# Patient Record
Sex: Female | Born: 1950
Health system: Southern US, Community
[De-identification: ages and names within clinical notes are randomized; demographics above are authoritative.]

## PROBLEM LIST (undated history)

## (undated) DIAGNOSIS — K219 Gastro-esophageal reflux disease without esophagitis: Secondary | ICD-10-CM

## (undated) DIAGNOSIS — I10 Essential (primary) hypertension: Secondary | ICD-10-CM

## (undated) DIAGNOSIS — L309 Dermatitis, unspecified: Secondary | ICD-10-CM

## (undated) DIAGNOSIS — R42 Dizziness and giddiness: Secondary | ICD-10-CM

## (undated) DIAGNOSIS — IMO0002 Reserved for concepts with insufficient information to code with codable children: Secondary | ICD-10-CM

## (undated) DIAGNOSIS — K432 Incisional hernia without obstruction or gangrene: Secondary | ICD-10-CM

## (undated) DIAGNOSIS — A64 Unspecified sexually transmitted disease: Secondary | ICD-10-CM

## (undated) DIAGNOSIS — R87610 Atypical squamous cells of undetermined significance on cytologic smear of cervix (ASC-US): Secondary | ICD-10-CM

## (undated) DIAGNOSIS — M1990 Unspecified osteoarthritis, unspecified site: Secondary | ICD-10-CM

## (undated) DIAGNOSIS — R200 Anesthesia of skin: Secondary | ICD-10-CM

## (undated) DIAGNOSIS — R202 Paresthesia of skin: Secondary | ICD-10-CM

## (undated) DIAGNOSIS — C649 Malignant neoplasm of unspecified kidney, except renal pelvis: Secondary | ICD-10-CM

## (undated) DIAGNOSIS — Z860100 Personal history of colon polyps, unspecified: Secondary | ICD-10-CM

## (undated) DIAGNOSIS — E785 Hyperlipidemia, unspecified: Secondary | ICD-10-CM

## (undated) HISTORY — DX: Dermatitis, unspecified: L30.9

## (undated) HISTORY — DX: Personal history of colon polyps, unspecified: Z86.0100

## (undated) HISTORY — PX: TUBAL LIGATION: SHX77

## (undated) HISTORY — PX: MYOMECTOMY ABDOMINAL APPROACH: SUR870

## (undated) HISTORY — DX: Unspecified sexually transmitted disease: A64

## (undated) HISTORY — DX: Atypical squamous cells of undetermined significance on cytologic smear of cervix (ASC-US): R87.610

## (undated) HISTORY — DX: Reserved for concepts with insufficient information to code with codable children: IMO0002

## (undated) HISTORY — DX: Essential (primary) hypertension: I10

## (undated) HISTORY — DX: Gastro-esophageal reflux disease without esophagitis: K21.9

## (undated) HISTORY — PX: CATARACT EXTRACTION, BILATERAL: SHX1313

## (undated) HISTORY — PX: OTHER SURGICAL HISTORY: SHX169

## (undated) HISTORY — DX: Personal history of colonic polyps: Z86.010

## (undated) HISTORY — DX: Malignant neoplasm of unspecified kidney, except renal pelvis: C64.9

---

## 2004-10-22 LAB — CONVERTED CEMR LAB: Pap Smear: NORMAL

## 2008-10-11 ENCOUNTER — Ambulatory Visit: Payer: Self-pay | Admitting: Internal Medicine

## 2008-10-11 DIAGNOSIS — L259 Unspecified contact dermatitis, unspecified cause: Secondary | ICD-10-CM | POA: Insufficient documentation

## 2008-10-11 DIAGNOSIS — I10 Essential (primary) hypertension: Secondary | ICD-10-CM | POA: Insufficient documentation

## 2008-10-12 DIAGNOSIS — K219 Gastro-esophageal reflux disease without esophagitis: Secondary | ICD-10-CM | POA: Insufficient documentation

## 2008-11-11 ENCOUNTER — Telehealth: Payer: Self-pay | Admitting: Internal Medicine

## 2008-11-12 ENCOUNTER — Ambulatory Visit: Payer: Self-pay | Admitting: Internal Medicine

## 2008-11-12 DIAGNOSIS — N912 Amenorrhea, unspecified: Secondary | ICD-10-CM | POA: Insufficient documentation

## 2008-11-12 LAB — CONVERTED CEMR LAB
ALT: 19 units/L (ref 0–35)
AST: 16 units/L (ref 0–37)
Albumin: 3.6 g/dL (ref 3.5–5.2)
Alkaline Phosphatase: 78 units/L (ref 39–117)
BUN: 21 mg/dL (ref 6–23)
Basophils Absolute: 0 10*3/uL (ref 0.0–0.1)
Basophils Relative: 0.2 % (ref 0.0–3.0)
Bilirubin Urine: NEGATIVE
Bilirubin, Direct: 0.1 mg/dL (ref 0.0–0.3)
CO2: 33 meq/L — ABNORMAL HIGH (ref 19–32)
Calcium: 10 mg/dL (ref 8.4–10.5)
Chloride: 102 meq/L (ref 96–112)
Creatinine, Ser: 0.8 mg/dL (ref 0.4–1.2)
Crystals: NEGATIVE
Eosinophils Absolute: 0.2 10*3/uL (ref 0.0–0.7)
Eosinophils Relative: 2.3 % (ref 0.0–5.0)
FSH: 61.3 milliintl units/mL
GFR calc Af Amer: 95 mL/min
GFR calc non Af Amer: 79 mL/min
Glucose, Bld: 117 mg/dL — ABNORMAL HIGH (ref 70–99)
HCT: 34.8 % — ABNORMAL LOW (ref 36.0–46.0)
Hemoglobin, Urine: NEGATIVE
Hemoglobin: 12.1 g/dL (ref 12.0–15.0)
Ketones, ur: NEGATIVE mg/dL
LH: 27.4 milliintl units/mL
Leukocytes, UA: NEGATIVE
Lymphocytes Relative: 30.7 % (ref 12.0–46.0)
MCHC: 34.7 g/dL (ref 30.0–36.0)
MCV: 88.6 fL (ref 78.0–100.0)
Magnesium: 2 mg/dL (ref 1.5–2.5)
Monocytes Absolute: 0.4 10*3/uL (ref 0.1–1.0)
Monocytes Relative: 5.9 % (ref 3.0–12.0)
Mucus, UA: NEGATIVE
Neutro Abs: 4 10*3/uL (ref 1.4–7.7)
Neutrophils Relative %: 60.9 % (ref 43.0–77.0)
Nitrite: NEGATIVE
Platelets: 167 10*3/uL (ref 150–400)
Potassium: 3.6 meq/L (ref 3.5–5.1)
RBC / HPF: NONE SEEN
RBC: 3.92 M/uL (ref 3.87–5.11)
RDW: 13.3 % (ref 11.5–14.6)
Sodium: 141 meq/L (ref 135–145)
Specific Gravity, Urine: >=1.03 (ref 1.000–1.03)
TSH: 0.41 microintl units/mL (ref 0.35–5.50)
Total Bilirubin: 0.6 mg/dL (ref 0.3–1.2)
Total Protein, Urine: NEGATIVE mg/dL
Total Protein: 6.9 g/dL (ref 6.0–8.3)
Urine Glucose: NEGATIVE mg/dL
Urobilinogen, UA: 0.2 (ref 0.0–1.0)
WBC, UA: NONE SEEN cells/hpf
WBC: 6.7 10*3/uL (ref 4.5–10.5)
pH: 5.5 (ref 5.0–8.0)

## 2008-11-14 ENCOUNTER — Encounter: Payer: Self-pay | Admitting: Internal Medicine

## 2008-12-27 ENCOUNTER — Ambulatory Visit: Payer: Self-pay | Admitting: Internal Medicine

## 2008-12-27 DIAGNOSIS — M25519 Pain in unspecified shoulder: Secondary | ICD-10-CM | POA: Insufficient documentation

## 2008-12-27 DIAGNOSIS — R7309 Other abnormal glucose: Secondary | ICD-10-CM | POA: Insufficient documentation

## 2008-12-27 DIAGNOSIS — D649 Anemia, unspecified: Secondary | ICD-10-CM | POA: Insufficient documentation

## 2008-12-27 DIAGNOSIS — M5412 Radiculopathy, cervical region: Secondary | ICD-10-CM | POA: Insufficient documentation

## 2008-12-27 LAB — CONVERTED CEMR LAB
Basophils Absolute: 0 10*3/uL (ref 0.0–0.1)
Basophils Relative: 0.3 % (ref 0.0–3.0)
Eosinophils Absolute: 0.1 10*3/uL (ref 0.0–0.7)
Eosinophils Relative: 2.3 % (ref 0.0–5.0)
Folate: >20 ng/mL
HCT: 38.3 % (ref 36.0–46.0)
Hemoglobin: 13.2 g/dL (ref 12.0–15.0)
Hgb A1c MFr Bld: 5.6 % (ref 4.6–6.0)
Iron: 74 ug/dL (ref 42–145)
Lymphocytes Relative: 26.5 % (ref 12.0–46.0)
MCHC: 34.5 g/dL (ref 30.0–36.0)
MCV: 89.3 fL (ref 78.0–100.0)
Monocytes Absolute: 0.6 10*3/uL (ref 0.1–1.0)
Monocytes Relative: 11.6 % (ref 3.0–12.0)
Neutro Abs: 3.1 10*3/uL (ref 1.4–7.7)
Neutrophils Relative %: 59.3 % (ref 43.0–77.0)
Platelets: 150 10*3/uL (ref 150–400)
RBC: 4.29 M/uL (ref 3.87–5.11)
RDW: 13.6 % (ref 11.5–14.6)
Saturation Ratios: 21.1 % (ref 20.0–50.0)
Transferrin: 250.5 mg/dL (ref 212.0–360.0)
Vitamin B-12: 469 pg/mL (ref 211–911)
WBC: 5.2 10*3/uL (ref 4.5–10.5)

## 2009-01-31 ENCOUNTER — Ambulatory Visit: Payer: Self-pay | Admitting: Internal Medicine

## 2009-10-11 ENCOUNTER — Telehealth: Payer: Self-pay | Admitting: Internal Medicine

## 2009-10-17 ENCOUNTER — Ambulatory Visit: Payer: Self-pay | Admitting: Internal Medicine

## 2009-11-16 ENCOUNTER — Ambulatory Visit: Payer: Self-pay | Admitting: Internal Medicine

## 2010-03-17 ENCOUNTER — Telehealth: Payer: Self-pay | Admitting: Internal Medicine

## 2010-05-17 ENCOUNTER — Ambulatory Visit: Payer: Self-pay | Admitting: Internal Medicine

## 2010-11-01 ENCOUNTER — Encounter: Payer: Self-pay | Admitting: Internal Medicine

## 2010-11-01 ENCOUNTER — Other Ambulatory Visit: Payer: Self-pay | Admitting: Internal Medicine

## 2010-11-01 ENCOUNTER — Ambulatory Visit
Admission: RE | Admit: 2010-11-01 | Discharge: 2010-11-01 | Payer: Self-pay | Source: Home / Self Care | Attending: Internal Medicine | Admitting: Internal Medicine

## 2010-11-01 ENCOUNTER — Other Ambulatory Visit
Admission: RE | Admit: 2010-11-01 | Discharge: 2010-11-01 | Payer: Self-pay | Source: Home / Self Care | Admitting: Internal Medicine

## 2010-11-01 DIAGNOSIS — Z860100 Personal history of colon polyps, unspecified: Secondary | ICD-10-CM | POA: Insufficient documentation

## 2010-11-01 DIAGNOSIS — E559 Vitamin D deficiency, unspecified: Secondary | ICD-10-CM | POA: Insufficient documentation

## 2010-11-01 LAB — CBC WITH DIFFERENTIAL/PLATELET
Basophils Absolute: 0 10*3/uL (ref 0.0–0.1)
Basophils Relative: 0.6 % (ref 0.0–3.0)
Eosinophils Absolute: 0.2 10*3/uL (ref 0.0–0.7)
Eosinophils Relative: 3 % (ref 0.0–5.0)
HCT: 37.1 % (ref 36.0–46.0)
Hemoglobin: 12.9 g/dL (ref 12.0–15.0)
Lymphocytes Relative: 31.4 % (ref 12.0–46.0)
Lymphs Abs: 1.6 10*3/uL (ref 0.7–4.0)
MCHC: 34.8 g/dL (ref 30.0–36.0)
MCV: 88.6 fl (ref 78.0–100.0)
Monocytes Absolute: 0.6 10*3/uL (ref 0.1–1.0)
Monocytes Relative: 11.9 % (ref 3.0–12.0)
Neutro Abs: 2.7 10*3/uL (ref 1.4–7.7)
Neutrophils Relative %: 53.1 % (ref 43.0–77.0)
Platelets: 153 10*3/uL (ref 150.0–400.0)
RBC: 4.18 Mil/uL (ref 3.87–5.11)
RDW: 14.5 % (ref 11.5–14.6)
WBC: 5 10*3/uL (ref 4.5–10.5)

## 2010-11-01 LAB — HEPATIC FUNCTION PANEL
ALT: 16 U/L (ref 0–35)
AST: 14 U/L (ref 0–37)
Albumin: 3.6 g/dL (ref 3.5–5.2)
Alkaline Phosphatase: 85 U/L (ref 39–117)
Bilirubin, Direct: 0 mg/dL (ref 0.0–0.3)
Total Bilirubin: 0.5 mg/dL (ref 0.3–1.2)
Total Protein: 6.8 g/dL (ref 6.0–8.3)

## 2010-11-01 LAB — BASIC METABOLIC PANEL
BUN: 20 mg/dL (ref 6–23)
CO2: 27 mEq/L (ref 19–32)
Calcium: 9.8 mg/dL (ref 8.4–10.5)
Chloride: 112 mEq/L (ref 96–112)
Creatinine, Ser: 0.8 mg/dL (ref 0.4–1.2)
GFR: 89.47 mL/min (ref >60.00)
Glucose, Bld: 95 mg/dL (ref 70–99)
Potassium: 4.2 mEq/L (ref 3.5–5.1)
Sodium: 144 mEq/L (ref 135–145)

## 2010-11-01 LAB — LIPID PANEL
Cholesterol: 192 mg/dL (ref 0–200)
HDL: 47.4 mg/dL (ref >39.00)
LDL Cholesterol: 132 mg/dL — ABNORMAL HIGH (ref 0–99)
Total CHOL/HDL Ratio: 4
Triglycerides: 62 mg/dL (ref 0.0–149.0)
VLDL: 12.4 mg/dL (ref 0.0–40.0)

## 2010-11-01 LAB — URINALYSIS, ROUTINE W REFLEX MICROSCOPIC
Bilirubin Urine: NEGATIVE
Hemoglobin, Urine: NEGATIVE
Ketones, ur: NEGATIVE
Leukocytes, UA: NEGATIVE
Nitrite: NEGATIVE
Specific Gravity, Urine: 1.025 (ref 1.000–1.030)
Total Protein, Urine: NEGATIVE
Urine Glucose: NEGATIVE
Urobilinogen, UA: 0.2 (ref 0.0–1.0)
pH: 5.5 (ref 5.0–8.0)

## 2010-11-01 LAB — HEMOGLOBIN A1C: Hgb A1c MFr Bld: 5.9 % (ref 4.6–6.5)

## 2010-11-01 LAB — TSH: TSH: 0.51 u[IU]/mL (ref 0.35–5.50)

## 2010-11-01 LAB — HM PAP SMEAR

## 2010-11-01 LAB — CONVERTED CEMR LAB: Vit D, 25-Hydroxy: 34 ng/mL (ref 30–89)

## 2010-11-02 ENCOUNTER — Encounter (INDEPENDENT_AMBULATORY_CARE_PROVIDER_SITE_OTHER): Payer: Self-pay | Admitting: *Deleted

## 2010-11-08 ENCOUNTER — Encounter
Admission: RE | Admit: 2010-11-08 | Discharge: 2010-11-08 | Payer: Self-pay | Source: Home / Self Care | Attending: Internal Medicine | Admitting: Internal Medicine

## 2010-11-08 LAB — HM MAMMOGRAPHY: HM Mammogram: NEGATIVE

## 2010-11-13 ENCOUNTER — Encounter (INDEPENDENT_AMBULATORY_CARE_PROVIDER_SITE_OTHER): Payer: Self-pay | Admitting: *Deleted

## 2010-11-13 ENCOUNTER — Encounter: Payer: Self-pay | Admitting: *Deleted

## 2010-11-15 ENCOUNTER — Ambulatory Visit
Admission: RE | Admit: 2010-11-15 | Discharge: 2010-11-15 | Payer: Self-pay | Source: Home / Self Care | Attending: Gastroenterology | Admitting: Gastroenterology

## 2010-11-21 NOTE — Progress Notes (Signed)
Summary: Rx refill req  Phone Note Refill Request   Refills Requested: Medication #1:  LISINOPRIL-HYDROCHLOROTHIAZIDE 10-12.5 MG TABS .Take 1 tablet by mouth once a day   Dosage confirmed as above?Dosage Confirmed Pt called requesting a 90 day supply of medication because she will not have Insurance after 06/01  Initial call taken by: Margaret Pyle, CMA,  Mar 17, 2010 11:17 AM    Prescriptions: LISINOPRIL-HYDROCHLOROTHIAZIDE 10-12.5 MG TABS (LISINOPRIL-HYDROCHLOROTHIAZIDE) .Take 1 tablet by mouth once a day  #90 x 1   Entered by:   Margaret Pyle, CMA   Authorized by:   Etta Grandchild MD   Signed by:   Margaret Pyle, CMA on 03/17/2010   Method used:   Electronically to        [facility] (retail)       [address]       [place], [place]  [address]       Ph: [phone]       Fax: [phone]   RxID:   [id number]
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
## 2010-11-22 ENCOUNTER — Encounter: Payer: Self-pay | Admitting: Internal Medicine

## 2010-11-23 NOTE — Assessment & Plan Note (Signed)
Summary: CPX/ TO COME FASTING/ NWS  #   Vital Signs:  Patient profile:   60 year old female Menstrual status:  postmenopausal Height:      62 inches Weight:      213 pounds BMI:     39.10 O2 Sat:      96 % on Room air Temp:     97.8 degrees F oral Pulse rate:   73 / minute Pulse rhythm:   regular Resp:     16 per minute BP supine:   156 / 88  (right arm) BP sitting:   160 / 86  (left arm) Cuff size:   large  Vitals Entered By: Rock Nephew CMA (November 01, 2010 9:16 AM)  Nutrition Counseling: Patient's BMI is greater than 25 and therefore counseled on weight management options.  O2 Flow:  Room air CC: Patient here for CPX w/labs, Preventive Care Is Patient Diabetic? No Pain Assessment Patient in pain? no       Does patient need assistance? Functional Status Self care Ambulation Normal LMP - Character: light     Menstrual Status postmenopausal Last PAP Result normal   Primary Care Provider:  Etta Grandchild MD  CC:  Patient here for CPX w/labs and Preventive Care.  History of Present Illness:  Hypertension Follow-Up      This is a 60 year old woman who presents for Hypertension follow-up.  The patient denies lightheadedness, urinary frequency, headaches, edema, rash, and fatigue.  The patient denies the following associated symptoms: chest pain, chest pressure, exercise intolerance, dyspnea, palpitations, syncope, leg edema, and pedal edema.  Compliance with medications (by patient report) has been poor.  The patient reports that dietary compliance has been fair.  The patient reports no exercise.  Adjunctive measures currently used by the patient include salt restriction and relaxation.  She ran out of BP med 5 days ago.  Dyspepsia History:      She has no alarm features of dyspepsia including no history of melena, hematochezia, dysphagia, persistent vomiting, or involuntary weight loss > 5%.  There is a prior history of GERD.  The patient does not have a prior  history of documented ulcer disease.  The dominant symptom is heartburn or acid reflux.  An H-2 blocker medication is currently being taken.  She notes that the symptoms have improved with the H-2 blocker therapy.  Symptoms have not persisted after 4 weeks of H-2 blocker treatment.     Preventive Screening-Counseling & Management  Alcohol-Tobacco     Alcohol drinks/day: 0     Alcohol Counseling: not indicated; patient does not drink     Smoking Status: never     Passive Smoke Exposure: no     Tobacco Counseling: not indicated; no tobacco use  Hep-HIV-STD-Contraception     Hepatitis Risk: no risk noted     HIV Risk: no     STD Risk: no risk noted     Dental Visit-last 6 months yes     Dental Care Counseling: to seek dental care; no dental care within six months     SBE monthly: yes     SBE Education/Counseling: to perform regular SBE     Sun Exposure-Excessive: no  Safety-Violence-Falls     Seat Belt Use: yes     Helmet Use: n/a     Firearms in the Home: no firearms in the home     Smoke Detectors: yes     Violence in the Home: no risk noted  Sexual Abuse: no  Current Medications (verified): 1)  Lisinopril-Hydrochlorothiazide 10-12.5 Mg Tabs (Lisinopril-Hydrochlorothiazide) .... .take 1 Tablet By Mouth Once A Day 2)  Topicort Lp 0.05 % Crea (Desoximetasone) .... Apply To Eczema Bid 3)  Vit D 2000 Iu 4)  Centrum Ultra Womens  Tabs (Multiple Vitamins-Minerals) .... Take 1 Tablet By Mouth Once A Day 5)  Vitamin E .... Take 1 Tablet By Mouth Once A Day  Allergies (verified): 1)  ! Codeine Sulfate (Codeine Sulfate)  Past History:  Past Surgical History: Last updated: 10/11/2008 Caesarean section  Family History: Last updated: 10/11/2008 Family History Hypertension Family History Uterine cancer  Social History: Last updated: 10/11/2008 Single Never Smoked Alcohol use-no Drug use-no Regular exercise-yes  Risk Factors: Alcohol Use: 0 (11/01/2010) Exercise: yes  (10/11/2008)  Risk Factors: Smoking Status: never (11/01/2010) Passive Smoke Exposure: no (11/01/2010)  Past Medical History: Hypertension Eczema GERD Colonic polyps, hx of  Social History: Hepatitis Risk:  no risk noted STD Risk:  no risk noted Dental Care w/in 6 mos.:  yes Sun Exposure-Excessive:  no Seat Belt Use:  yes  Review of Systems  The patient denies anorexia, fever, weight loss, weight gain, chest pain, syncope, dyspnea on exertion, peripheral edema, prolonged cough, headaches, hemoptysis, abdominal pain, melena, hematochezia, severe indigestion/heartburn, hematuria, suspicious skin lesions, transient blindness, difficulty walking, depression, unusual weight change, abnormal bleeding, enlarged lymph nodes, angioedema, and breast masses.   Derm:  Denies changes in color of skin, changes in nail beds, dryness, itching, and rash. Endo:  Denies cold intolerance, excessive hunger, excessive thirst, excessive urination, heat intolerance, polyuria, and weight change. Heme:  Denies abnormal bruising, bleeding, enlarge lymph nodes, fevers, pallor, and skin discoloration.  Physical Exam  General:  alert, well-developed, well-nourished, well-hydrated, and overweight-appearing.   Head:  normocephalic, atraumatic, no abnormalities observed, and no abnormalities palpated.   Eyes:  vision grossly intact, pupils equal, pupils round, and pupils reactive to light.   Ears:  R ear normal and L ear normal.   Nose:  External nasal examination shows no deformity or inflammation. Nasal mucosa are pink and moist without lesions or exudates. Mouth:  Oral mucosa and oropharynx without lesions or exudates.  Teeth in good repair. Neck:  supple, full ROM, no masses, no thyromegaly, and no thyroid nodules or tenderness.   Chest Wall:  No deformities, masses, or tenderness noted. Breasts:  No mass, nodules, thickening, tenderness, bulging, retraction, inflamation, nipple discharge or skin changes  noted.   Lungs:  Normal respiratory effort, chest expands symmetrically. Lungs are clear to auscultation, no crackles or wheezes. Heart:  Normal rate and regular rhythm. S1 and S2 normal without gallop, murmur, click, rub or other extra sounds. Abdomen:  soft, non-tender, normal bowel sounds, no distention, no masses, no guarding, no rigidity, no rebound tenderness, no abdominal hernia, no inguinal hernia, no hepatomegaly, and no splenomegaly.   Rectal:  No external abnormalities noted. Normal sphincter tone. No rectal masses or tenderness. Genitalia:  Normal introitus for age, no external lesions, no vaginal discharge, mucosa pink and moist, no vaginal or cervical lesions, no vaginal atrophy, no friaility or hemorrhage, normal uterus size and position, no adnexal masses or tenderness Msk:  normal ROM, no joint tenderness, and no joint swelling.   Pulses:  R and L carotid,radial,femoral,dorsalis pedis and posterior tibial pulses are full and equal bilaterally Extremities:  trace left pedal edema and trace right pedal edema.   Neurologic:  No cranial nerve deficits noted. Station and gait are normal. Plantar reflexes  are down-going bilaterally. DTRs are symmetrical throughout. Sensory, motor and coordinative functions appear intact. Skin:  Intact without suspicious lesions or rashes Cervical Nodes:  no anterior cervical adenopathy and no posterior cervical adenopathy.   Axillary Nodes:  no R axillary adenopathy and no L axillary adenopathy.   Inguinal Nodes:  no R inguinal adenopathy and no L inguinal adenopathy.   Psych:  Cognition and judgment appear intact. Alert and cooperative with normal attention span and concentration. No apparent delusions, illusions, hallucinations   Impression & Recommendations:  Problem # 1:  ROUTINE GENERAL MEDICAL EXAM@HEALTH  CARE FACL (ICD-V70.0) Assessment New  Orders: Venipuncture ([address]) T-Vitamin D (25-Hydroxy) [phone]) Radiology Referral  (Radiology) TLB-Lipid Panel (80061-LIPID) TLB-BMP (Basic Metabolic Panel-BMET) (80048-METABOL) TLB-CBC Platelet - w/Differential (85025-CBCD) TLB-TSH (Thyroid Stimulating Hormone) (84443-TSH) TLB-Hepatic/Liver Function Pnl (80076-HEPATIC) TLB-A1C / Hgb A1C (Glycohemoglobin) (83036-A1C) TLB-Udip w/ Micro (81001-URINE)  Problem # 2:  HYPERTENSION (ICD-401.9) Assessment: Deteriorated  Her updated medication list for this problem includes:    Lisinopril-hydrochlorothiazide 10-12.5 Mg Tabs (Lisinopril-hydrochlorothiazide) ..... Marland Kitchentake 1 tablet by mouth once a day  Orders: Venipuncture (9[address]) T-Vitamin D (25-Hydroxy) (29562-13086) TLB-Lipid Panel (80061-LIPID) TLB-BMP (Basic Metabolic Panel-BMET) (80048-METABOL) TLB-CBC Platelet - w/Differential (85025-CBCD) TLB-TSH (Thyroid Stimulating Hormone) (84443-TSH) TLB-Hepatic/Liver Function Pnl (80076-HEPATIC) TLB-A1C / Hgb A1C (Glycohemoglobin) (83036-A1C) TLB-Udip w/ Micro (81001-URINE)  BP today: 160/86 Prior BP: 134/82 (11/16/2009)  Prior 10 Yr Risk Heart Disease: Not enough information (10/11/2008)  Labs Reviewed: K+: 3.6 (11/12/2008) Creat: : 0.8 (11/12/2008)     Problem # 3:  VITAMIN D DEFICIENCY (ICD-268.9) Assessment: Unchanged  Orders: Venipuncture ([address]) T-Vitamin D (25-Hydroxy) (96295-28413) TLB-Lipid Panel (80061-LIPID) TLB-BMP (Basic Metabolic Panel-BMET) (80048-METABOL) TLB-CBC Platelet - w/Differential (85025-CBCD) TLB-TSH (Thyroid Stimulating Hormone) (84443-TSH) TLB-Hepatic/Liver Function Pnl (80076-HEPATIC) TLB-A1C / Hgb A1C (Glycohemoglobin) (83036-A1C) TLB-Udip w/ Micro (81001-URINE)  Problem # 4:  UNSPECIFIED ANEMIA (ICD-285.9) Assessment: Unchanged  Orders: Venipuncture (244[address]) T-Vitamin D (25-Hydroxy) (02725-36644) Hemoccult Guaiac-1 spec.(in office) (82270) TLB-Lipid Panel (80061-LIPID) TLB-BMP (Basic Metabolic Panel-BMET) (80048-METABOL) TLB-CBC Platelet - w/Differential (85025-CBCD) TLB-TSH  (Thyroid Stimulating Hormone) (84443-TSH) TLB-Hepatic/Liver Function Pnl (80076-HEPATIC) TLB-A1C / Hgb A1C (Glycohemoglobin) (83036-A1C) TLB-Udip w/ Micro (81001-URINE)  Complete Medication List: 1)  Lisinopril-hydrochlorothiazide 10-12.5 Mg Tabs (Lisinopril-hydrochlorothiazide) .... .take 1 tablet by mouth once a day 2)  Topicort Lp 0.05 % Crea (Desoximetasone) .... Apply to eczema bid 3)  Vit D 2000 Iu  4)  Centrum Ultra Womens Tabs (Multiple vitamins-minerals) .... Take 1 tablet by mouth once a day 5)  Vitamin E  .... Take 1 tablet by mouth once a day  Other Orders: Specimen Handling (03474) Gastroenterology Referral (GI)  Colorectal Screening:  Current Recommendations:    Hemoccult: NEG X 1 today    Colonoscopy recommended: scheduled with G.I.  PAP Screening:    Last PAP smear:  10/22/2004    Reviewed PAP smear recommendations:  PAP smear done  Mammogram Screening:    Last Mammogram:  10/22/2004    Reviewed Mammogram recommendations:  mammogram ordered  Osteoporosis Risk Assessment:  Risk Factors for Fracture or Low Bone Density:   Smoking status:       never  Immunization & Chemoprophylaxis:    Tetanus vaccine: Historical  (01/20/2006)  Patient Instructions: 1)  Please schedule a follow-up appointment in 2 months. 2)  It is important that you exercise regularly at least 20 minutes 5 times a week. If you develop chest pain, have severe difficulty breathing, or feel very tired , stop exercising immediately and seek medical attention. 3)  You need to lose weight. Consider  a lower calorie diet and regular exercise.  4)  Schedule your mammogram. 5)  Schedule a colonoscopy/sigmoidoscopy to help detect colon cancer. 6)  You need to have a Pap Smear to prevent cervical cancer. 7)  Check your Blood Pressure regularly. If it is above 140/90: you should make an appointment. Prescriptions: LISINOPRIL-HYDROCHLOROTHIAZIDE 10-12.5 MG TABS (LISINOPRIL-HYDROCHLOROTHIAZIDE) .Take 1  tablet by mouth once a day  #90 x 3   Entered and Authorized by:   Etta Grandchild MD   Signed by:   Etta Grandchild MD on 11/01/2010   Method used:   Electronically to        [facility] (retail)       [address]       [place], [place]  [address]       Ph: [phone]       Fax: [phone]   RxID:   [id number] LISINOPRIL-HYDROCHLOROTHIAZIDE 10-12.5 MG TABS (LISINOPRIL-HYDROCHLOROTHIAZIDE) .Take 1 tablet by mouth once a day  #90 x 1   Entered and Authorized by:   Etta Grandchild MD   Signed by:   Etta Grandchild MD on 11/01/2010   Method used:   Print then Give to Patient   RxID:   [id number]    Orders Added: 1)  Venipuncture [64403] 2)  T-Vitamin D (25-Hydroxy) [47425-95638] 3)  Hemoccult Guaiac-1 spec.(in office) [82270] 4)  Specimen Handling [99000] 5)  Radiology Referral [Radiology] 6)  Gastroenterology Referral [GI] 7)  TLB-Lipid Panel [80061-LIPID] 8)  TLB-BMP (Basic Metabolic Panel-BMET) [80048-METABOL] 9)  TLB-CBC Platelet - w/Differential [85025-CBCD] 10)  TLB-TSH (Thyroid Stimulating Hormone) [84443-TSH] 11)  TLB-Hepatic/Liver Function Pnl [80076-HEPATIC] 12)  TLB-A1C / Hgb A1C (Glycohemoglobin) [83036-A1C] 13)  TLB-Udip w/ Micro [81001-URINE] 14)  Est. Patient 40-64 years [99396] 15)  Est. Patient Level III [75643]    Preventive Care Screening  Mammogram:    Date:  10/22/2004    Results:  normal   Pap Smear:    Date:  10/22/2004    Results:  normal

## 2010-11-23 NOTE — Letter (Signed)
Summary: Lipid Letter  [facility]  [address] [place], [place] [address]   Phone: [phone]  Fax: [phone]    11/01/2010  Teresa Collins [address] [place], [place]  [address]  Dear Teresa Collins:  We have carefully reviewed your last lipid profile from 11/01/2010 and the results are noted below with a summary of recommendations for lipid management.    Cholesterol:       192     Goal: <200   HDL "good" Cholesterol:   18.84     Goal: >40   LDL "bad" Cholesterol:   132     Goal: <130   Triglycerides:       62.0     Goal: <150    other labs look good    TLC Diet (Therapeutic Lifestyle Change): Saturated Fats & Transfatty acids should be kept < 7% of total calories ***Reduce Saturated Fats Polyunstaurated Fat can be up to 10% of total calories Monounsaturated Fat Fat can be up to 20% of total calories Total Fat should be no greater than 25-35% of total calories Carbohydrates should be 50-60% of total calories Protein should be approximately 15% of total calories Fiber should be at least 20-30 grams a day ***Increased fiber may help lower LDL Total Cholesterol should be < 200mg /day Consider adding plant stanol/sterols to diet (example: Benacol spread) ***A higher intake of unsaturated fat may reduce Triglycerides and Increase HDL    Adjunctive Measures (may lower LIPIDS and reduce risk of Heart Attack) include: Aerobic Exercise (20-30 minutes 3-4 times a week) Limit Alcohol Consumption Weight Reduction Aspirin 75-81 mg a day by mouth (if not allergic or contraindicated) Dietary Fiber 20-30 grams a day by mouth     Current Medications: 1)    Lisinopril-hydrochlorothiazide 10-12.5 Mg Tabs (Lisinopril-hydrochlorothiazide) .... .take 1 tablet by mouth once a day 2)    Topicort Lp 0.05 % Crea (Desoximetasone) .... Apply to eczema bid 3)    Vit D 2000 Iu  4)    Centrum Ultra Womens  Tabs (Multiple vitamins-minerals) .... Take 1 tablet by mouth once a  day 5)    Vitamin E  .... Take 1 tablet by mouth once a day  If you have any questions, please call. We appreciate being able to work with you.   Sincerely,    [facility] Etta Grandchild MD

## 2010-11-23 NOTE — Miscellaneous (Signed)
Summary: [facility] PREVISIT/PREP  Clinical Lists Changes  Medications: Added new medication of MOVIPREP 100 GM  SOLR (PEG-KCL-NACL-NASULF-NA ASC-C) As per prep instructions. - Signed Rx of MOVIPREP 100 GM  SOLR (PEG-KCL-NACL-NASULF-NA ASC-C) As per prep instructions.;  #1 x 0;  Signed;  Entered by: Wyona Almas RN;  Authorized by: Mardella Layman MD [facility];  Method used: Electronically to [facility] [address], [place], [place], [place]  [address], Ph: [phone], Fax: [phone] Observations: Added new observation of ALLERGY REV: Done (11/15/2010 16:20)    Prescriptions: MOVIPREP 100 GM  SOLR (PEG-KCL-NACL-NASULF-NA ASC-C) As per prep instructions.  #1 x 0   Entered by:   Wyona Almas RN   Authorized by:   Mardella Layman MD [facility]   Signed by:   Wyona Almas RN on 11/15/2010   Method used:   Electronically to        [facility] (retail)       [address]       [place], [place]  [address]       Ph: [phone]       Fax: [phone]   RxID:   [id number]

## 2010-11-23 NOTE — Letter (Signed)
Summary: Pre Visit Letter Revised  [facility]  [address] [place], [place] [address]   Phone: [phone]  Fax: [phone]        11/02/2010 MRN: [id number] Teresa Collins [address] [place], [place]  [address]             Procedure Date:  11/29/2010 @ 8:30   Direct colon-Dr. Jarold Motto   Welcome to the Gastroenterology Division at [facility].    You are scheduled to see a nurse for your pre-procedure visit on 11/15/2010 at 9:00am on the 3rd floor at [facility], [address]  We ask that you try to arrive at our office 15 minutes prior to your appointment time to allow for check-in.  Please take a minute to review the attached form.  If you answer "Yes" to one or more of the questions on the first page, we ask that you call the person listed at your earliest opportunity.  If you answer "No" to all of the questions, please complete the rest of the form and bring it to your appointment.    Your nurse visit will consist of discussing your medical and surgical history, your immediate family medical history, and your medications.   If you are unable to list all of your medications on the form, please bring the medication bottles to your appointment and we will list them.  We will need to be aware of both prescribed and over the counter drugs.  We will need to know exact dosage information as well.    Please be prepared to read and sign documents such as consent forms, a financial agreement, and acknowledgement forms.  If necessary, and with your consent, a friend or relative is welcome to sit-in on the nurse visit with you.  Please bring your insurance card so that we may make a copy of it.  If your insurance requires a referral to see a specialist, please bring your referral form from your primary care physician.  No co-pay is required for this nurse visit.     If you cannot keep your appointment, please call [phone] to cancel or reschedule prior  to your appointment date.  This allows [place] the opportunity to schedule an appointment for another patient in need of care.    Thank you for choosing [facility] for your medical needs.  We appreciate the opportunity to care for you.  Please visit [place] at our website  to learn more about our practice.  Sincerely, The Gastroenterology Division

## 2010-11-23 NOTE — Letter (Signed)
Summary: [facility] Instructions  [facility]  [address] [place], [place] [address]   Phone: [phone]  Fax: [phone]       GERLEAN CID    [birth year]    MRN: [id number]        Procedure Day Dorna Bloom: Wednesday 12-13-10     Arrival Time: 10:30 am     Procedure Time: 11:30 am     Location of Procedure:                    _x _  [facility] (4th Floor)   PREPARATION FOR COLONOSCOPY WITH MOVIPREP   Starting 5 days prior to your procedure  12-08-10 do not eat nuts, seeds, popcorn, corn, beans, peas,  salads, or any raw vegetables.  Do not take any fiber supplements (e.g. Metamucil, Citrucel, and Benefiber).  THE DAY BEFORE YOUR PROCEDURE         DATE:  12-12-10   DAY:  Tuesday   1.  Drink clear liquids the entire day-NO SOLID FOOD  2.  Do not drink anything colored red or purple.  Avoid juices with pulp.  No orange juice.  3.  Drink at least 64 oz. (8 glasses) of fluid/clear liquids during the day to prevent dehydration and help the prep work efficiently.  CLEAR LIQUIDS INCLUDE: Water Jello Ice Popsicles Tea (sugar ok, no milk/cream) Powdered fruit flavored drinks Coffee (sugar ok, no milk/cream) Gatorade Juice: apple, white grape, white cranberry  Lemonade Clear bullion, consomm, broth Carbonated beverages (any kind) Strained chicken noodle soup Hard Candy                             4.  In the morning, mix first dose of MoviPrep solution:    Empty 1 Pouch A and 1 Pouch B into the disposable container    Add lukewarm drinking water to the top line of the container. Mix to dissolve    Refrigerate (mixed solution should be used within 24 hrs)  5.  Begin drinking the prep at 5:00 p.m. The MoviPrep container is divided by 4 marks.   Every 15 minutes drink the solution down to the next mark (approximately 8 oz) until the full liter is complete.   6.  Follow completed prep with 16 oz of clear liquid of your choice (Nothing red or  purple).  Continue to drink clear liquids until bedtime.  7.  Before going to bed, mix second dose of MoviPrep solution:    Empty 1 Pouch A and 1 Pouch B into the disposable container    Add lukewarm drinking water to the top line of the container. Mix to dissolve    Refrigerate  THE DAY OF YOUR PROCEDURE      DATE: 12-13-10   DAY:  Wednesday  Beginning at  6:30 a.m. (5 hours before procedure):         1. Every 15 minutes, drink the solution down to the next mark (approx 8 oz) until the full liter is complete.  2. Follow completed prep with 16 oz. of clear liquid of your choice.    3. You may drink clear liquids until  9:30 a.m. (2 HOURS BEFORE PROCEDURE).   MEDICATION INSTRUCTIONS  Unless otherwise instructed, you should take regular prescription medications with a small sip of water   as early as possible the morning of your procedure.    Additional medication instructions: Lisinopril/HCTZ the morning  of procedure.         OTHER INSTRUCTIONS  You will need a responsible adult at least 59 years of age to accompany you and drive you home.   This person must remain in the waiting room during your procedure.  Wear loose fitting clothing that is easily removed.  Leave jewelry and other valuables at home.  However, you may wish to bring a book to read or  an iPod/MP3 player to listen to music as you wait for your procedure to start.  Remove all body piercing jewelry and leave at home.  Total time from sign-in until discharge is approximately 2-3 hours.  You should go home directly after your procedure and rest.  You can resume normal activities the  day after your procedure.  The day of your procedure you should not:   Drive   Make legal decisions   Operate machinery   Drink alcohol   Return to work  You will receive specific instructions about eating, activities and medications before you leave.    The above instructions have been reviewed and explained  to me by   Wyona Almas RN  November 15, 2010 4:57 PM     I fully understand and can verbalize these instructions _____________________________ Date _________

## 2010-12-13 ENCOUNTER — Other Ambulatory Visit: Payer: Self-pay | Admitting: Gastroenterology

## 2010-12-13 ENCOUNTER — Other Ambulatory Visit (AMBULATORY_SURGERY_CENTER): Payer: Managed Care, Other (non HMO) | Admitting: Gastroenterology

## 2010-12-13 DIAGNOSIS — Z1211 Encounter for screening for malignant neoplasm of colon: Secondary | ICD-10-CM

## 2010-12-13 DIAGNOSIS — K5730 Diverticulosis of large intestine without perforation or abscess without bleeding: Secondary | ICD-10-CM

## 2010-12-13 DIAGNOSIS — D126 Benign neoplasm of colon, unspecified: Secondary | ICD-10-CM

## 2010-12-13 LAB — HM COLONOSCOPY

## 2010-12-19 ENCOUNTER — Encounter: Payer: Self-pay | Admitting: Gastroenterology

## 2010-12-19 NOTE — Procedures (Addendum)
Summary: Colonoscopy  Patient: Teresa Collins Note: All result statuses are Final unless otherwise noted.  Tests: (1) Colonoscopy (COL)   COL Colonoscopy           DONE     [facility]     [address]     [place], [place]  [address]           COLONOSCOPY PROCEDURE REPORT           PATIENT:  Teresa, Collins  MR#:  [id number]     BIRTHDATE:  September 23, 1951, 59 yrs. old  GENDER:  female     ENDOSCOPIST:  Vania Rea. Jarold Motto, MD, [facility]     REF. BY:  Etta Grandchild, M.D.     PROCEDURE DATE:  12/13/2010     PROCEDURE:  Colonoscopy with snare polypectomy     ASA CLASS:  Class II     INDICATIONS:  Routine Risk Screening     MEDICATIONS:   Fentanyl 75 mcg IV, Versed 6 mg IV           DESCRIPTION OF PROCEDURE:   After the risks benefits and     alternatives of the procedure were thoroughly explained, informed     consent was obtained.  Digital rectal exam was performed and     revealed no abnormalities.   The LB CF-[id number] endoscope     was introduced through the anus and advanced to the cecum, which     was identified by both the appendix and ileocecal valve, without     limitations.  The quality of the prep was excellent, using     MoviPrep.  The instrument was then slowly withdrawn as the colon     was fully examined.     <<PROCEDUREIMAGES>>           FINDINGS:  Moderate diverticulosis was found in the sigmoid to     descending colon segments. MULTIPLE 2-5 MM LEFT COLON POLYPS HOT     SNARE EXCISED FROM LEFT COLON.  There were multiple polyps     identified and removed. MANY 2-6 MM POLYPS HOT SNARE EXCISED FROM     LEFT COLON.SEE PICTURES.   Retroflexed views in the rectum     revealed no abnormalities.    The scope was then withdrawn from     the patient and the procedure completed.           COMPLICATIONS:  None     ENDOSCOPIC IMPRESSION:     1) Moderate diverticulosis in the sigmoid to descending colon     segments     2) Polyps, multiple     R/O ADENOMAS     RECOMMENDATIONS:     1) high fiber diet     2) Repeat colonoscopy in 5 years if polyp adenomatous; otherwise     10 years     REPEAT EXAM:  No           ______________________________     Vania Rea. Jarold Motto, MD, [facility]           CC:           n.     eSIGNED:   Vania Rea. Alvin Rubano at 12/13/2010 11:57 AM           Rande Brunt, [id number]  Note: An exclamation mark (!) indicates a result that was not dispersed into the flowsheet. Document Creation Date: 12/13/2010 11:58 AM _______________________________________________________________________  (1) Order result status: Final Collection or  observation date-time: 12/13/2010 11:50 Requested date-time:  Receipt date-time:  Reported date-time:  Referring Physician:   Ordering Physician: Sheryn Bison [phone]) Specimen Source:  Source: Launa Grill Order Number: [id number] Lab site:   Appended Document: Colonoscopy Recall     Procedures Next Due Date:    Colonoscopy: 12/2013

## 2010-12-28 NOTE — Letter (Signed)
Summary: Patient Notice- Polyp Results  [facility]  [address] [place], [place] [address]   Phone: [phone]  Fax: [phone]        December 19, 2010 MRN: [id number]    Teresa Collins [address] [place], [place]  [address]    Dear Ms. Dan Humphreys,  I am pleased to inform you that the colon polyp(s) removed during your recent colonoscopy was (were) found to be benign (no cancer detected) upon pathologic examination.  I recommend you have a repeat colonoscopy examination in 3_ years to look for recurrent polyps, as having colon polyps increases your risk for having recurrent polyps or even colon cancer in the future.Your Polyps were adenomatous polyps and I would recommend followup in 3 years because of their pathology and the multitude of polyps you had.  Should you develop new or worsening symptoms of abdominal pain, bowel habit changes or bleeding from the rectum or bowels, please schedule an evaluation with either your primary care physician or with me.  Additional information/recommendations:  _x_ No further action with gastroenterology is needed at this time. Please      follow-up with your primary care physician for your other healthcare      needs.  __ Please call [phone] to schedule a return visit to review your      situation.  __ Please keep your follow-up visit as already scheduled.  __ Continue treatment plan as outlined the day of your exam.  Please call us if you are having persistent problems or have questions about your condition that have not been fully answered at this time.  Sincerely,  Mardella Layman MD [facility]  This letter has been electronically signed by your physician.  Appended Document: Patient Notice- Polyp Results letter mailed

## 2011-01-03 ENCOUNTER — Encounter: Payer: Self-pay | Admitting: Internal Medicine

## 2011-01-03 ENCOUNTER — Ambulatory Visit (INDEPENDENT_AMBULATORY_CARE_PROVIDER_SITE_OTHER): Payer: Managed Care, Other (non HMO) | Admitting: Internal Medicine

## 2011-01-03 DIAGNOSIS — I10 Essential (primary) hypertension: Secondary | ICD-10-CM

## 2011-01-09 NOTE — Assessment & Plan Note (Signed)
Summary: 2 MTH FU/NWS   Vital Signs:  Patient profile:   60 year old female Menstrual status:  postmenopausal Height:      62 inches Weight:      207 pounds BMI:     38.00 O2 Sat:      98 % on Room air Temp:     98.5 degrees F oral Pulse rate:   67 / minute Pulse rhythm:   regular Resp:     16 per minute BP sitting:   120 / 82  (left arm) Cuff size:   large  Vitals Entered By: Rock Nephew CMA (January 03, 2011 9:01 AM)  Nutrition Counseling: Patient's BMI is greater than 25 and therefore counseled on weight management options.  O2 Flow:  Room air CC: follow-up visit Is Patient Diabetic? No Pain Assessment Patient in pain? no       Does patient need assistance? Functional Status Self care Ambulation Normal   Primary Care Provider:  Etta Grandchild MD  CC:  follow-up visit.  History of Present Illness:  Hypertension Follow-Up      This is a 60 year old woman who presents for Hypertension follow-up.  The patient denies lightheadedness, urinary frequency, headaches, edema, rash, and fatigue.  The patient denies the following associated symptoms: chest pain, chest pressure, exercise intolerance, dyspnea, palpitations, syncope, leg edema, and pedal edema.  Compliance with medications (by patient report) has been near 100%.  The patient reports that dietary compliance has been good.  The patient reports exercising 3-4X per week.  Adjunctive measures currently used by the patient include salt restriction and relaxation.    Preventive Screening-Counseling & Management  Alcohol-Tobacco     Alcohol drinks/day: 0     Alcohol Counseling: not indicated; patient does not drink     Smoking Status: never     Passive Smoke Exposure: no     Tobacco Counseling: not indicated; no tobacco use  Hep-HIV-STD-Contraception     Hepatitis Risk: no risk noted     HIV Risk: no     STD Risk: no risk noted     Dental Visit-last 6 months yes     Dental Care Counseling: to seek dental care;  no dental care within six months     SBE monthly: yes     SBE Education/Counseling: to perform regular SBE     Sun Exposure-Excessive: no      Drug Use:  no.    Clinical Review Panels:  Prevention   Last Mammogram:  ASSESSMENT: Negative - BI-RADS 1^MM DIGITAL SCREENING (11/08/2010)   Last Pap Smear:  NEGATIVE FOR INTRAEPITHELIAL LESIONS OR MALIGNANCY. (11/01/2010)   Last Colonoscopy:  DONE (12/13/2010)  Immunizations   Last Tetanus Booster:  Historical (01/20/2006)  Lipid Management   Cholesterol:  192 (11/01/2010)   LDL (bad choesterol):  132 (11/01/2010)   HDL (good cholesterol):  47.40 (11/01/2010)  Diabetes Management   HgBA1C:  5.9 (11/01/2010)   Creatinine:  0.8 (11/01/2010)  CBC   WBC:  5.0 (11/01/2010)   RBC:  4.18 (11/01/2010)   Hgb:  12.9 (11/01/2010)   Hct:  37.1 (11/01/2010)   Platelets:  153.0 (11/01/2010)   MCV  88.6 (11/01/2010)   MCHC  34.8 (11/01/2010)   RDW  14.5 (11/01/2010)   PMN:  53.1 (11/01/2010)   Lymphs:  31.4 (11/01/2010)   Monos:  11.9 (11/01/2010)   Eosinophils:  3.0 (11/01/2010)   Basophil:  0.6 (11/01/2010)  Complete Metabolic Panel   Glucose:  95 (11/01/2010)  Sodium:  144 (11/01/2010)   Potassium:  4.2 (11/01/2010)   Chloride:  112 (11/01/2010)   CO2:  27 (11/01/2010)   BUN:  20 (11/01/2010)   Creatinine:  0.8 (11/01/2010)   Albumin:  3.6 (11/01/2010)   Total Protein:  6.8 (11/01/2010)   Calcium:  9.8 (11/01/2010)   Total Bili:  0.5 (11/01/2010)   Alk Phos:  85 (11/01/2010)   SGPT (ALT):  16 (11/01/2010)   SGOT (AST):  14 (11/01/2010)   Current Medications (verified): 1)  Lisinopril-Hydrochlorothiazide 10-12.5 Mg Tabs (Lisinopril-Hydrochlorothiazide) .... .take 1 Tablet By Mouth Once A Day 2)  Topicort Lp 0.05 % Crea (Desoximetasone) .... Apply To Eczema Bid 3)  Vit D 2000 Iu 4)  Centrum Ultra Womens  Tabs (Multiple Vitamins-Minerals) .... Take 1 Tablet By Mouth Once A Day 5)  Vitamin E .... Take 1 Tablet By Mouth Once A  Day  Allergies (verified): 1)  ! Codeine Sulfate (Codeine Sulfate)  Past History:  Past Medical History: Last updated: 11/01/2010 Hypertension Eczema GERD Colonic polyps, hx of  Past Surgical History: Last updated: 10/11/2008 Caesarean section  Family History: Last updated: 10/11/2008 Family History Hypertension Family History Uterine cancer  Social History: Last updated: 10/11/2008 Single Never Smoked Alcohol use-no Drug use-no Regular exercise-yes  Risk Factors: Alcohol Use: 0 (01/03/2011) Exercise: yes (10/11/2008)  Risk Factors: Smoking Status: never (01/03/2011) Passive Smoke Exposure: no (01/03/2011)  Family History: Reviewed history from 10/11/2008 and no changes required. Family History Hypertension Family History Uterine cancer  Social History: Reviewed history from 10/11/2008 and no changes required. Single Never Smoked Alcohol use-no Drug use-no Regular exercise-yes  Review of Systems  The patient denies weight loss, weight gain, chest pain, syncope, dyspnea on exertion, peripheral edema, prolonged cough, headaches, hemoptysis, abdominal pain, hematuria, muscle weakness, suspicious skin lesions, difficulty walking, and depression.    Physical Exam  General:  alert, well-developed, well-nourished, well-hydrated, and overweight-appearing.   Head:  normocephalic, atraumatic, no abnormalities observed, and no abnormalities palpated.   Mouth:  Oral mucosa and oropharynx without lesions or exudates.  Teeth in good repair. Neck:  supple, full ROM, no masses, no thyromegaly, and no thyroid nodules or tenderness.   Lungs:  Normal respiratory effort, chest expands symmetrically. Lungs are clear to auscultation, no crackles or wheezes. Heart:  Normal rate and regular rhythm. S1 and S2 normal without gallop, murmur, click, rub or other extra sounds. Abdomen:  soft, non-tender, normal bowel sounds, no distention, no masses, no guarding, no rigidity, no  rebound tenderness, no abdominal hernia, no inguinal hernia, no hepatomegaly, and no splenomegaly.   Pulses:  R and L carotid,radial,femoral,dorsalis pedis and posterior tibial pulses are full and equal bilaterally Extremities:  No clubbing, cyanosis, edema, or deformity noted with normal full range of motion of all joints.   Neurologic:  No cranial nerve deficits noted. Station and gait are normal. Plantar reflexes are down-going bilaterally. DTRs are symmetrical throughout. Sensory, motor and coordinative functions appear intact. Skin:  Intact without suspicious lesions or rashes Cervical Nodes:  no anterior cervical adenopathy and no posterior cervical adenopathy.   Psych:  Cognition and judgment appear intact. Alert and cooperative with normal attention span and concentration. No apparent delusions, illusions, hallucinations   Impression & Recommendations:  Problem # 1:  HYPERTENSION (ICD-401.9) Assessment Improved  Her updated medication list for this problem includes:    Lisinopril-hydrochlorothiazide 10-12.5 Mg Tabs (Lisinopril-hydrochlorothiazide) ..... Marland Kitchentake 1 tablet by mouth once a day  BP today: 120/82 Prior BP: 156/88 (11/01/2010)  Prior  10 Yr Risk Heart Disease: Not enough information (10/11/2008)  Labs Reviewed: K+: 4.2 (11/01/2010) Creat: : 0.8 (11/01/2010)   Chol: 192 (11/01/2010)   HDL: 47.40 (11/01/2010)   LDL: 132 (11/01/2010)   TG: 62.0 (11/01/2010)  Complete Medication List: 1)  Lisinopril-hydrochlorothiazide 10-12.5 Mg Tabs (Lisinopril-hydrochlorothiazide) .... .take 1 tablet by mouth once a day 2)  Topicort Lp 0.05 % Crea (Desoximetasone) .... Apply to eczema bid 3)  Vit D 2000 Iu  4)  Centrum Ultra Womens Tabs (Multiple vitamins-minerals) .... Take 1 tablet by mouth once a day 5)  Vitamin E  .... Take 1 tablet by mouth once a day  Patient Instructions: 1)  Please schedule a follow-up appointment in 4 months. 2)  It is important that you exercise regularly at  least 20 minutes 5 times a week. If you develop chest pain, have severe difficulty breathing, or feel very tired , stop exercising immediately and seek medical attention. 3)  You need to lose weight. Consider a lower calorie diet and regular exercise.  4)  Check your Blood Pressure regularly. If it is above 130/80: you should make an appointment.   Orders Added: 1)  Est. Patient Level III [11914]

## 2011-07-10 ENCOUNTER — Encounter: Payer: Self-pay | Admitting: Internal Medicine

## 2011-07-11 ENCOUNTER — Encounter: Payer: Self-pay | Admitting: Internal Medicine

## 2011-07-11 ENCOUNTER — Ambulatory Visit (INDEPENDENT_AMBULATORY_CARE_PROVIDER_SITE_OTHER): Payer: Managed Care, Other (non HMO) | Admitting: Internal Medicine

## 2011-07-11 ENCOUNTER — Other Ambulatory Visit (INDEPENDENT_AMBULATORY_CARE_PROVIDER_SITE_OTHER): Payer: Managed Care, Other (non HMO)

## 2011-07-11 DIAGNOSIS — D649 Anemia, unspecified: Secondary | ICD-10-CM

## 2011-07-11 DIAGNOSIS — R7309 Other abnormal glucose: Secondary | ICD-10-CM

## 2011-07-11 DIAGNOSIS — I10 Essential (primary) hypertension: Secondary | ICD-10-CM

## 2011-07-11 LAB — COMPREHENSIVE METABOLIC PANEL
ALT: 14 U/L (ref 0–35)
AST: 15 U/L (ref 0–37)
CO2: 30 mEq/L (ref 19–32)
Calcium: 9.6 mg/dL (ref 8.4–10.5)
Chloride: 105 mEq/L (ref 96–112)
Creatinine, Ser: 0.9 mg/dL (ref 0.4–1.2)
GFR: 85.71 mL/min (ref >60.00)
Sodium: 140 mEq/L (ref 135–145)
Total Bilirubin: 0.3 mg/dL (ref 0.3–1.2)
Total Protein: 7.2 g/dL (ref 6.0–8.3)

## 2011-07-11 LAB — CBC WITH DIFFERENTIAL/PLATELET
Basophils Absolute: 0 10*3/uL (ref 0.0–0.1)
Eosinophils Absolute: 0.1 10*3/uL (ref 0.0–0.7)
Lymphocytes Relative: 33.8 % (ref 12.0–46.0)
MCHC: 33 g/dL (ref 30.0–36.0)
Monocytes Relative: 11.2 % (ref 3.0–12.0)
Neutro Abs: 2.6 10*3/uL (ref 1.4–7.7)
Neutrophils Relative %: 52.3 % (ref 43.0–77.0)
Platelets: 168 10*3/uL (ref 150.0–400.0)
RDW: 13.8 % (ref 11.5–14.6)

## 2011-07-11 NOTE — Progress Notes (Signed)
  Subjective:    Patient ID: Teresa Collins, female    DOB: 1951/02/19, 60 y.o.   MRN: [id number]  Hypertension This is a chronic problem. The current episode started more than 1 year ago. The problem has been gradually improving since onset. The problem is controlled. Pertinent negatives include no anxiety, blurred vision, chest pain, headaches, malaise/fatigue, neck pain, orthopnea, palpitations, peripheral edema, PND, shortness of breath or sweats. There are no associated agents to hypertension. Past treatments include angiotensin blockers and diuretics. The current treatment provides moderate improvement. Compliance problems include psychosocial issues, exercise and diet.       Review of Systems  Constitutional: Negative.  Negative for malaise/fatigue.  HENT: Negative.  Negative for neck pain.   Eyes: Negative.  Negative for blurred vision.  Respiratory: Negative.  Negative for shortness of breath.   Cardiovascular: Negative.  Negative for chest pain, palpitations, orthopnea and PND.  Gastrointestinal: Negative.   Genitourinary: Negative.   Musculoskeletal: Negative.   Skin: Negative.   Neurological: Negative.  Negative for headaches.  Hematological: Negative.   Psychiatric/Behavioral: Negative.        Objective:   Physical Exam  Vitals reviewed. Constitutional: She is oriented to person, place, and time. She appears well-developed and well-nourished. No distress.  HENT:  Mouth/Throat: Oropharynx is clear and moist. No oropharyngeal exudate.  Eyes: Conjunctivae are normal. Right eye exhibits no discharge. Left eye exhibits no discharge. No scleral icterus.  Neck: Normal range of motion. Neck supple. No JVD present. No tracheal deviation present. No thyromegaly present.  Cardiovascular: Normal rate, regular rhythm, normal heart sounds and intact distal pulses.  Exam reveals no gallop and no friction rub.   No murmur heard. Pulmonary/Chest: Effort normal and breath sounds  normal. No stridor. No respiratory distress. She has no wheezes. She has no rales. She exhibits no tenderness.  Abdominal: Soft. Bowel sounds are normal. She exhibits no distension and no mass. There is no tenderness. There is no rebound and no guarding.  Musculoskeletal: Normal range of motion. She exhibits no edema and no tenderness.  Lymphadenopathy:    She has no cervical adenopathy.  Neurological: She is oriented to person, place, and time. She displays normal reflexes. She exhibits normal muscle tone.  Skin: Skin is warm and dry. No rash noted. She is not diaphoretic. No erythema. No pallor.  Psychiatric: She has a normal mood and affect. Her behavior is normal. Judgment and thought content normal.      Lab Results  Component Value Date   WBC 5.0 11/01/2010   HGB 12.9 11/01/2010   HCT 37.1 11/01/2010   PLT 153.0 11/01/2010   CHOL 192 11/01/2010   TRIG 62.0 11/01/2010   HDL 47.40 11/01/2010   ALT 16 11/01/2010   AST 14 11/01/2010   NA 144 11/01/2010   K 4.2 11/01/2010   CL 112 11/01/2010   CREATININE 0.8 11/01/2010   BUN 20 11/01/2010   CO2 27 11/01/2010   TSH 0.51 11/01/2010   HGBA1C 5.9 11/01/2010      Assessment & Plan:

## 2011-07-11 NOTE — Assessment & Plan Note (Signed)
I will check her A1C today 

## 2011-07-11 NOTE — Patient Instructions (Signed)

## 2011-07-11 NOTE — Assessment & Plan Note (Signed)
This appears to be stable, will check her CBC today

## 2011-07-11 NOTE — Assessment & Plan Note (Signed)
She has had some issues with compliance but otherwise her BP is well controlled, I will check her lytes and renal function today

## 2011-10-16 ENCOUNTER — Emergency Department (HOSPITAL_COMMUNITY)
Admission: EM | Admit: 2011-10-16 | Discharge: 2011-10-16 | Disposition: A | Discharge status: Home / Self Care | Payer: Managed Care, Other (non HMO) | Attending: Emergency Medicine | Admitting: Emergency Medicine

## 2011-10-16 ENCOUNTER — Encounter (HOSPITAL_COMMUNITY): Payer: Self-pay | Admitting: Emergency Medicine

## 2011-10-16 ENCOUNTER — Other Ambulatory Visit: Payer: Self-pay

## 2011-10-16 DIAGNOSIS — H81399 Other peripheral vertigo, unspecified ear: Secondary | ICD-10-CM | POA: Insufficient documentation

## 2011-10-16 DIAGNOSIS — I10 Essential (primary) hypertension: Secondary | ICD-10-CM | POA: Insufficient documentation

## 2011-10-16 DIAGNOSIS — Z79899 Other long term (current) drug therapy: Secondary | ICD-10-CM | POA: Insufficient documentation

## 2011-10-16 LAB — GLUCOSE, CAPILLARY

## 2011-10-16 MED ORDER — MECLIZINE HCL 25 MG PO TABS: 25.0000 mg | ORAL_TABLET | Freq: Four times a day (QID) | ORAL | Status: DC | PRN

## 2011-10-16 MED ORDER — METOCLOPRAMIDE HCL 5 MG/ML IJ SOLN
10.0000 mg | Freq: Once | INTRAMUSCULAR | Status: AC
  Administered 2011-10-16: 10 mg via INTRAVENOUS
  Filled 2011-10-16: qty 2

## 2011-10-16 MED ORDER — DIAZEPAM 5 MG PO TABS: 5.0000 mg | ORAL_TABLET | Freq: Four times a day (QID) | ORAL | Status: DC | PRN

## 2011-10-16 MED ORDER — MECLIZINE HCL 25 MG PO TABS
25.0000 mg | ORAL_TABLET | Freq: Once | ORAL | Status: AC
  Administered 2011-10-16: 25 mg via ORAL
  Filled 2011-10-16: qty 1

## 2011-10-16 NOTE — ED Notes (Signed)
Sx started yesterday at work at [employer] and when slicing machine was turned on, patient began having weird sound in her right ear and just hasn't felt well since. Today, she got home and while talking with daughter, she became light headed.  Became clammy.  Denies LOC.  Becomes more light headed with change of position.

## 2011-10-16 NOTE — ED Notes (Signed)
Patient states that she does feel dizzy even while lying still. Describes dizziness as room is spinning and that the inside of her head is spinning.

## 2011-10-16 NOTE — ED Notes (Signed)
ZOX:[id number]<AV> Expected date:10/16/11<BR> Expected time: 6:23 PM<BR> Means of arrival:Ambulance<BR> Comments:<BR> Syncope

## 2011-10-16 NOTE — ED Notes (Signed)
Pt given discharge instructions and verbalizes understanding  

## 2011-10-16 NOTE — ED Notes (Signed)
Patient states that dizziness is much better and that she is able to sit up and move around without difficulty.

## 2011-10-16 NOTE — ED Provider Notes (Signed)
History     CSN: [id number]  Arrival date & time 10/16/11  1324   First MD Initiated Contact with Patient 10/16/11 2004      Chief Complaint  Patient presents with  . Near Syncope  vertigo  (Consider location/radiation/quality/duration/timing/severity/associated sxs/prior treatment) HPI This 60 year old female has 2 days of a feeling like her right ear is extra sensitive to sound with a strange feeling in her right ear without decreased hearing or headache or trauma, today at about 5:15 this evening she was moving and felt sudden onset of room spinning vertigo with no nausea and no strokelike symptoms to go along with the vertigo. She is able to walk unassisted but had difficulty walking at home compared to normal and all had to hold onto items to maintain her balance to her sensation of motion. She had no headache and no change in speech, vision, swallowing, or understanding. She also has no localized weak or incoordination of her arms. Her legs did not feel uncoordinated when she felt as if the room was spinning which unsteady for her to walk. She denies no headache and no chest pain palpitations or shortness of breath. She does not feel lightheaded. Her symptoms are improving but not quite back to baseline yet. She has not had vertigo in the past. Her symptoms were much better she stayed still and much worse with any movement of her head whatsoever prior to arrival. Past Medical History  Diagnosis Date  . Hypertension   . GERD (gastroesophageal reflux disease)   . Eczema   . Hx of colonic polyps     Past Surgical History  Procedure Date  . Cesarean section     Family History  Problem Relation Age of Onset  . Hypertension Other   . Cancer Other     Uterine Cancer    History  Substance Use Topics  . Smoking status: Never Smoker   . Smokeless tobacco: Not on file  . Alcohol Use: No    OB History    Grav Para Term Preterm Abortions TAB SAB Ect Mult Living                  Review of Systems  Constitutional: Negative for fever.       10 Systems reviewed and are negative for acute change except as noted in the HPI.  HENT: Negative for ear pain and congestion.   Eyes: Negative for discharge and redness.  Respiratory: Negative for cough and shortness of breath.   Cardiovascular: Negative for chest pain and palpitations.  Gastrointestinal: Negative for vomiting and abdominal pain.  Musculoskeletal: Negative for back pain.  Skin: Negative for rash.  Neurological: Positive for dizziness. Negative for syncope, facial asymmetry, speech difficulty, weakness, light-headedness, numbness and headaches.  Psychiatric/Behavioral:       No behavior change.    Allergies  Codeine sulfate and Latex  Home Medications   Current Outpatient Rx  Name Route Sig Dispense Refill  . CHLORPHENIRAMINE-ACETAMINOPHEN 2-325 MG PO TABS Oral Take 1 tablet by mouth every 6 (six) hours as needed. For head cold relief     . VITAMIN D 2000 UNITS PO CAPS Oral Take by mouth.      . DESOXIMETASONE 0.05 % EX CREA Topical Apply topically 2 (two) times daily.      Marland Kitchen LISINOPRIL-HYDROCHLOROTHIAZIDE 10-12.5 MG PO TABS Oral Take 1 tablet by mouth daily.      . CENTRUM ULTRA WOMENS PO TABS Oral Take 1 tablet by mouth daily.      Marland Kitchen  VITAMIN E PO Oral Take by mouth.      Marland Kitchen DIAZEPAM 5 MG PO TABS Oral Take 1 tablet (5 mg total) by mouth every 6 (six) hours as needed (vertigo). 10 tablet 0  . MECLIZINE HCL 25 MG PO TABS Oral Take 1 tablet (25 mg total) by mouth every 6 (six) hours as needed for dizziness. 20 tablet 0    BP 184/90  Pulse 56  Temp(Src) 98.2 F (36.8 C) (Oral)  Resp 16  Ht 5\' 1"  (1.549 m)  Wt 190 lb (86.183 kg)  BMI 35.90 kg/m2  SpO2 100%  Physical Exam  Nursing note and vitals reviewed. Constitutional: She is oriented to person, place, and time.       Awake, alert, nontoxic appearance with baseline speech for patient.  HENT:  Head: Atraumatic.  Mouth/Throat: No  oropharyngeal exudate.       Tympanic membranes are clear bilaterally  Eyes: EOM are normal. Pupils are equal, round, and reactive to light. Right eye exhibits no discharge. Left eye exhibits no discharge.  Neck: Neck supple.  Cardiovascular: Normal rate and regular rhythm.   No murmur heard. Pulmonary/Chest: Effort normal and breath sounds normal. No stridor. No respiratory distress. She has no wheezes. She has no rales. She exhibits no tenderness.  Abdominal: Soft. Bowel sounds are normal. She exhibits no mass. There is no tenderness. There is no rebound.  Musculoskeletal: She exhibits no edema and no tenderness.       Baseline ROM, moves extremities with no obvious new focal weakness.  Lymphadenopathy:    She has no cervical adenopathy.  Neurological: She is alert and oriented to person, place, and time. Coordination normal.       Awake, alert, cooperative and aware of situation; motor strength bilaterally; sensation normal to light touch bilaterally; peripheral visual fields full to confrontation; no facial asymmetry; tongue midline; major cranial nerves appear intact; no pronator drift, normal finger to nose bilaterally, baseline gait without new ataxia.  She has minimal lateral nystagmus now with extraocular movements and position changes of her torso and head with minimal vertigo during my initial examination and the patient is able to walk forwards and backwards unassisted in the ED.  Skin: No rash noted.  Psychiatric: She has a normal mood and affect.    ED Course  Procedures (including critical care time) ECG:  normal sinus rhythm, ventricular rate 78, normal axis, normal intervals, impression normal ECG, no comparison available  Feels much better moving much faster with normal gait in ED without difficulty after treatment.  Labs Reviewed  GLUCOSE, CAPILLARY  LAB REPORT - SCANNED   No results found.   1. Peripheral vertigo       MDM  I doubt any other EMC precluding  discharge at this time including, but not necessarily limited to the following:TIA/CVA/SAH.  Pt feels improved after observation and/or treatment in ED.Patient / Family / Caregiver informed of clinical course, understand medical decision-making process, and agree with plan.        Hurman Horn, MD 10/17/11 1330

## 2011-10-20 ENCOUNTER — Emergency Department (INDEPENDENT_AMBULATORY_CARE_PROVIDER_SITE_OTHER)
Admission: EM | Admit: 2011-10-20 | Discharge: 2011-10-20 | Disposition: A | Discharge status: Home / Self Care | Payer: Managed Care, Other (non HMO) | Source: Home / Self Care | Attending: Emergency Medicine | Admitting: Emergency Medicine

## 2011-10-20 ENCOUNTER — Encounter (HOSPITAL_COMMUNITY): Payer: Self-pay | Admitting: *Deleted

## 2011-10-20 DIAGNOSIS — H8309 Labyrinthitis, unspecified ear: Secondary | ICD-10-CM

## 2011-10-20 HISTORY — DX: Dizziness and giddiness: R42

## 2011-10-20 MED ORDER — DIAZEPAM 5 MG PO TABS: 5.0000 mg | ORAL_TABLET | Freq: Four times a day (QID) | ORAL | Status: AC | PRN

## 2011-10-20 MED ORDER — MECLIZINE HCL 25 MG PO TABS: 25.0000 mg | ORAL_TABLET | Freq: Four times a day (QID) | ORAL | Status: AC | PRN

## 2011-10-20 MED ORDER — PREDNISONE 10 MG PO TABS: ORAL_TABLET | ORAL | Status: DC

## 2011-10-20 NOTE — ED Provider Notes (Signed)
History     CSN: [id number]  Arrival date & time 10/20/11  1347   First MD Initiated Contact with Patient 10/20/11 1432      Chief Complaint  Patient presents with  . Otalgia  . Dizziness    (Consider location/radiation/quality/duration/timing/severity/associated sxs/prior treatment) HPI Comments: Teresa Collins is a 60 year old female who 6 days ago noted sudden onset of a roaring sensation in the right ear, ear congestion and pain, she was more sensitive to noise than usual and then she developed a constant whirling vertigo. She had some headache. The next day, December 25 she went to the emergency room by EMS at [facility]. There she was examined and diagnosed with peripheral vertigo. She was given meclizine and Valium which did seem to help in she stopped taking this yesterday because it made her drowsy. Thereafter her symptoms recurred. She denies any diplopia or blurred vision. She's had no paresthesias, weakness, difficulty with speech, coordination, or ambulation. No stiff neck, fever, or strokelike symptoms.  Patient is a 60 y.o. female presenting with ear pain.  Otalgia Associated symptoms include hearing loss. Pertinent negatives include no headaches and no rhinorrhea.    Past Medical History  Diagnosis Date  . Hypertension   . GERD (gastroesophageal reflux disease)   . Eczema   . Hx of colonic polyps   . Vertigo     Dx 10/16/11    Past Surgical History  Procedure Date  . Cesarean section   . Uterine fibroid surgery     Family History  Problem Relation Age of Onset  . Hypertension Other   . Cancer Other     Uterine Cancer    History  Substance Use Topics  . Smoking status: Never Smoker   . Smokeless tobacco: Not on file  . Alcohol Use: No    OB History    Grav Para Term Preterm Abortions TAB SAB Ect Mult Living                  Review of Systems  HENT: Positive for hearing loss and ear pain. Negative for congestion, rhinorrhea, sneezing,  postnasal drip and tinnitus.   Eyes: Negative for visual disturbance.  Cardiovascular: Negative for chest pain and palpitations.  Neurological: Positive for dizziness and light-headedness. Negative for syncope, facial asymmetry, speech difficulty, weakness, numbness and headaches.    Allergies  Codeine sulfate and Latex  Home Medications   Current Outpatient Rx  Name Route Sig Dispense Refill  . VITAMIN D 2000 UNITS PO CAPS Oral Take by mouth.      . DESOXIMETASONE 0.05 % EX CREA Topical Apply topically 2 (two) times daily.      Marland Kitchen LISINOPRIL-HYDROCHLOROTHIAZIDE 10-12.5 MG PO TABS Oral Take 1 tablet by mouth daily.      . CENTRUM ULTRA WOMENS PO TABS Oral Take 1 tablet by mouth daily.      Marland Kitchen VITAMIN E PO Oral Take by mouth.      . CHLORPHENIRAMINE-ACETAMINOPHEN 2-325 MG PO TABS Oral Take 1 tablet by mouth every 6 (six) hours as needed. For head cold relief     . DIAZEPAM 5 MG PO TABS Oral Take 1 tablet (5 mg total) by mouth every 6 (six) hours as needed (vertigo). 20 tablet 0  . MECLIZINE HCL 25 MG PO TABS Oral Take 1 tablet (25 mg total) by mouth every 6 (six) hours as needed for dizziness. 20 tablet 0  . PREDNISONE 10 MG PO TABS  Take 4 tabs daily  for 4 days, 3 tabs daily for 4 days, 2 tabs daily for 4 days, then 1 tab daily for 4 days.  Take all tabs at one time with food and preferably in the morning except for the first dose. 40 tablet 0    BP 139/70  Pulse 74  Temp(Src) 97.4 F (36.3 C) (Oral)  Resp 18  SpO2 100%  Physical Exam  Nursing note and vitals reviewed. Constitutional: She is oriented to person, place, and time. She appears well-developed and well-nourished. No distress.  HENT:  Head: Normocephalic and atraumatic.  Right Ear: External ear normal.  Left Ear: External ear normal.  Nose: Nose normal.  Mouth/Throat: Oropharynx is clear and moist.  Eyes: Conjunctivae and EOM are normal. Pupils are equal, round, and reactive to light. Right eye exhibits normal  extraocular motion and no nystagmus. Left eye exhibits normal extraocular motion and no nystagmus.  Neck: Normal range of motion. Neck supple.  Cardiovascular: Normal rate, regular rhythm, normal heart sounds and intact distal pulses.  Exam reveals no gallop and no friction rub.   No murmur heard. Neurological: She is alert and oriented to person, place, and time. She has normal strength and normal reflexes. She displays no tremor. No cranial nerve deficit or sensory deficit. She exhibits normal muscle tone. Coordination and gait normal.       She was alert, oriented, speech was fluent and appropriate. PERRLA, full EOMs, fundi were benign. Her hearing was normal. The Dix-Hallpike maneuver was positive with the right ear down. There was no pronator drift. Finger to nose was normal. DTRs are normal. Muscle strength is normal bilaterally. Radial nerves were intact. Romberg sign was negative. She was able to perform tandem gait forward and backward without any difficulty.  Skin: She is not diaphoretic.    ED Course  Procedures (including critical care time)  Labs Reviewed - No data to display No results found.   1. Acute labyrinthitis       MDM  I think she may have either acute labyrinthitis, Mnire's syndrome, or Lithiasis. I did her out on Apley exercises and gave her a some prednisone along with refills on Valium and meclizine since this did seem to help. I suggested that she followup with Dr. Suzanna Obey next week.        Roque Lias, MD 10/20/11 2130

## 2011-10-20 NOTE — ED Notes (Signed)
C/O right earache and "echoing" in right ear - onset 12/24.  The following day, pt went to [facility] via EMS for dizziness - was diagnosed w/ vertigo and given 2 Rxs (valium and another unk med); took meds up until yest and felt better.  Today had to work, so didn't take meds, started w/ vertigo again at approx 1215.  Continues w/ right earache.

## 2011-10-31 ENCOUNTER — Telehealth: Payer: Self-pay

## 2011-10-31 DIAGNOSIS — R42 Dizziness and giddiness: Secondary | ICD-10-CM

## 2011-10-31 NOTE — Telephone Encounter (Signed)
Patient called [facility] stating that she was recently seen in [facility] ER and urgent care for vertigo. Per pt, ER MD recommends a referral to ENT Dr Jearld Fenton. Thanks

## 2011-10-31 NOTE — Telephone Encounter (Signed)
[facility] to notify 

## 2011-10-31 NOTE — Telephone Encounter (Signed)
done

## 2011-11-26 ENCOUNTER — Other Ambulatory Visit: Payer: Self-pay | Admitting: Internal Medicine

## 2012-01-09 ENCOUNTER — Ambulatory Visit: Payer: Managed Care, Other (non HMO) | Admitting: Internal Medicine

## 2012-01-17 ENCOUNTER — Ambulatory Visit (INDEPENDENT_AMBULATORY_CARE_PROVIDER_SITE_OTHER)
Admission: RE | Admit: 2012-01-17 | Discharge: 2012-01-17 | Disposition: A | Discharge status: Home / Self Care | Payer: Managed Care, Other (non HMO) | Source: Ambulatory Visit | Attending: Internal Medicine | Admitting: Internal Medicine

## 2012-01-17 ENCOUNTER — Other Ambulatory Visit (INDEPENDENT_AMBULATORY_CARE_PROVIDER_SITE_OTHER): Payer: Managed Care, Other (non HMO)

## 2012-01-17 ENCOUNTER — Ambulatory Visit (INDEPENDENT_AMBULATORY_CARE_PROVIDER_SITE_OTHER): Payer: Managed Care, Other (non HMO) | Admitting: Internal Medicine

## 2012-01-17 ENCOUNTER — Encounter: Payer: Self-pay | Admitting: Internal Medicine

## 2012-01-17 VITALS — BP 122/70 | HR 67 | Temp 97.8°F | Resp 16 | Wt 197.0 lb

## 2012-01-17 DIAGNOSIS — I10 Essential (primary) hypertension: Secondary | ICD-10-CM

## 2012-01-17 DIAGNOSIS — M25559 Pain in unspecified hip: Secondary | ICD-10-CM

## 2012-01-17 DIAGNOSIS — M25552 Pain in left hip: Secondary | ICD-10-CM

## 2012-01-17 DIAGNOSIS — R05 Cough: Secondary | ICD-10-CM

## 2012-01-17 DIAGNOSIS — M1990 Unspecified osteoarthritis, unspecified site: Secondary | ICD-10-CM

## 2012-01-17 DIAGNOSIS — J209 Acute bronchitis, unspecified: Secondary | ICD-10-CM

## 2012-01-17 DIAGNOSIS — R059 Cough, unspecified: Secondary | ICD-10-CM

## 2012-01-17 DIAGNOSIS — M25519 Pain in unspecified shoulder: Secondary | ICD-10-CM

## 2012-01-17 DIAGNOSIS — R7309 Other abnormal glucose: Secondary | ICD-10-CM

## 2012-01-17 DIAGNOSIS — D649 Anemia, unspecified: Secondary | ICD-10-CM

## 2012-01-17 DIAGNOSIS — M25512 Pain in left shoulder: Secondary | ICD-10-CM

## 2012-01-17 LAB — CBC WITH DIFFERENTIAL/PLATELET
Basophils Absolute: 0 10*3/uL (ref 0.0–0.1)
Hemoglobin: 13.9 g/dL (ref 12.0–15.0)
Lymphocytes Relative: 30 % (ref 12.0–46.0)
Monocytes Relative: 13.3 % — ABNORMAL HIGH (ref 3.0–12.0)
Platelets: 180 10*3/uL (ref 150.0–400.0)
RDW: 13.5 % (ref 11.5–14.6)
WBC: 4.9 10*3/uL (ref 4.5–10.5)

## 2012-01-17 LAB — COMPREHENSIVE METABOLIC PANEL
ALT: 13 U/L (ref 0–35)
CO2: 29 mEq/L (ref 19–32)
Calcium: 9.9 mg/dL (ref 8.4–10.5)
Chloride: 101 mEq/L (ref 96–112)
Creatinine, Ser: 0.9 mg/dL (ref 0.4–1.2)
GFR: 78.25 mL/min (ref >60.00)
Glucose, Bld: 108 mg/dL — ABNORMAL HIGH (ref 70–99)
Total Protein: 7.7 g/dL (ref 6.0–8.3)

## 2012-01-17 LAB — LIPID PANEL
Cholesterol: 195 mg/dL (ref 0–200)
Triglycerides: 66 mg/dL (ref 0.0–149.0)

## 2012-01-17 LAB — HEMOGLOBIN A1C: Hgb A1c MFr Bld: 5.8 % (ref 4.6–6.5)

## 2012-01-17 MED ORDER — AZITHROMYCIN 500 MG PO TABS: 500.0000 mg | ORAL_TABLET | Freq: Every day | ORAL | Status: AC

## 2012-01-17 MED ORDER — CELECOXIB 200 MG PO CAPS: 200.0000 mg | ORAL_CAPSULE | Freq: Every day | ORAL | Status: AC

## 2012-01-17 MED ORDER — OLMESARTAN MEDOXOMIL-HCTZ 20-12.5 MG PO TABS: 1.0000 | ORAL_TABLET | Freq: Every day | ORAL | Status: DC

## 2012-01-17 NOTE — Progress Notes (Signed)
Subjective:    Patient ID: Teresa Collins, female    DOB: [birth year], 61 y.o.   MRN: [id number]  Cough This is a recurrent problem. The current episode started more than 1 month ago. The problem has been gradually worsening. The problem occurs every few hours. The cough is productive of purulent sputum. Associated symptoms include chills. Pertinent negatives include no chest pain, ear congestion, ear pain, eye redness, fever, headaches, heartburn, hemoptysis, myalgias, nasal congestion, postnasal drip, rash, rhinorrhea, sore throat, shortness of breath, sweats, weight loss or wheezing. The symptoms are aggravated by nothing. She has tried oral steroids and prescription cough suppressant for the symptoms. The treatment provided no relief.  Hypertension This is a chronic problem. The current episode started more than 1 year ago. The problem has been gradually improving since onset. The problem is controlled. Pertinent negatives include no anxiety, blurred vision, chest pain, headaches, malaise/fatigue, neck pain, orthopnea, palpitations, peripheral edema, PND, shortness of breath or sweats. Past treatments include ACE inhibitors and diuretics. The current treatment provides moderate improvement. Compliance problems include exercise and diet.       Review of Systems  Constitutional: Positive for chills and unexpected weight change (weight gain). Negative for fever, weight loss, malaise/fatigue, diaphoresis, activity change, appetite change and fatigue.  HENT: Negative for hearing loss, ear pain, nosebleeds, congestion, sore throat, facial swelling, rhinorrhea, sneezing, drooling, mouth sores, trouble swallowing, neck pain, neck stiffness, dental problem, voice change, postnasal drip, sinus pressure, tinnitus and ear discharge.   Eyes: Negative for blurred vision, photophobia, pain, discharge, redness, itching and visual disturbance.  Respiratory: Positive for cough. Negative for apnea, hemoptysis,  choking, chest tightness, shortness of breath, wheezing and stridor.   Cardiovascular: Negative for chest pain, palpitations, orthopnea, leg swelling and PND.  Gastrointestinal: Negative for heartburn, nausea, vomiting, abdominal pain, diarrhea, constipation, blood in stool, abdominal distention and anal bleeding.  Genitourinary: Negative for dysuria, urgency, frequency, hematuria, flank pain, decreased urine volume, enuresis, difficulty urinating and dyspareunia.  Musculoskeletal: Positive for arthralgias (left hip and left shoulder pain for one year). Negative for myalgias, back pain, joint swelling and gait problem.  Skin: Negative for color change, pallor, rash and wound.  Neurological: Negative for dizziness, tremors, seizures, syncope, facial asymmetry, speech difficulty, weakness, light-headedness, numbness and headaches.  Hematological: Negative for adenopathy. Does not bruise/bleed easily.  Psychiatric/Behavioral: Negative.        Objective:   Physical Exam  Vitals reviewed. Constitutional: She is oriented to person, place, and time. She appears well-developed and well-nourished. No distress.  HENT:  Head: Normocephalic and atraumatic.  Mouth/Throat: Oropharynx is clear and moist. No oropharyngeal exudate.  Eyes: Conjunctivae are normal. Right eye exhibits no discharge. Left eye exhibits no discharge. No scleral icterus.  Neck: Normal range of motion. Neck supple. No JVD present. No tracheal deviation present. No thyromegaly present.  Cardiovascular: Normal rate, regular rhythm, normal heart sounds and intact distal pulses.  Exam reveals no gallop and no friction rub.   No murmur heard. Pulmonary/Chest: Effort normal and breath sounds normal. No stridor. No respiratory distress. She has no wheezes. She has no rales. She exhibits no tenderness.  Abdominal: Soft. Bowel sounds are normal. She exhibits no distension and no mass. There is no tenderness. There is no rebound and no  guarding.  Musculoskeletal: Normal range of motion. She exhibits no edema and no tenderness.  Lymphadenopathy:    She has no cervical adenopathy.  Neurological: She is oriented to person, place, and time.  Skin: Skin is  warm and dry. No rash noted. She is not diaphoretic. No erythema. No pallor.  Psychiatric: She has a normal mood and affect. Her behavior is normal. Judgment and thought content normal.     Lab Results  Component Value Date   WBC 4.9 07/11/2011   HGB 13.0 07/11/2011   HCT 39.3 07/11/2011   PLT 168.0 07/11/2011   GLUCOSE 108* 07/11/2011   CHOL 192 11/01/2010   TRIG 62.0 11/01/2010   HDL 47.40 11/01/2010   LDLCALC 132* 11/01/2010   ALT 14 07/11/2011   AST 15 07/11/2011   NA 140 07/11/2011   K 4.2 07/11/2011   CL 105 07/11/2011   CREATININE 0.9 07/11/2011   BUN 20 07/11/2011   CO2 30 07/11/2011   TSH 0.51 11/01/2010   HGBA1C 5.7 07/11/2011       Assessment & Plan:

## 2012-01-17 NOTE — Patient Instructions (Signed)
Acute Bronchitis You have acute bronchitis. This means you have a chest cold. The airways in your lungs are red and sore (inflamed). Acute means it is sudden onset.  CAUSES Bronchitis is most often caused by the same virus that causes a cold. SYMPTOMS   Body aches.   Chest congestion.   Chills.   Cough.   Fever.   Shortness of breath.   Sore throat.  TREATMENT  Acute bronchitis is usually treated with rest, fluids, and medicines for relief of fever or cough. Most symptoms should go away after a few days or a week. Increased fluids may help thin your secretions and will prevent dehydration. Your caregiver may give you an inhaler to improve your symptoms. The inhaler reduces shortness of breath and helps control cough. You can take over-the-counter pain relievers or cough medicine to decrease coughing, pain, or fever. A cool-air vaporizer may help thin bronchial secretions and make it easier to clear your chest. Antibiotics are usually not needed but can be prescribed if you smoke, are seriously ill, have chronic lung problems, are elderly, or you are at higher risk for developing complications.Allergies and asthma can make bronchitis worse. Repeated episodes of bronchitis may cause longstanding lung problems. Avoid smoking and secondhand smoke.Exposure to cigarette smoke or irritating chemicals will make bronchitis worse. If you are a cigarette smoker, consider using nicotine gum or skin patches to help control withdrawal symptoms. Quitting smoking will help your lungs heal faster. Recovery from bronchitis is often slow, but you should start feeling better after 2 to 3 days. Cough from bronchitis frequently lasts for 3 to 4 weeks. To prevent another bout of acute bronchitis:  Quit smoking.   Wash your hands frequently to get rid of viruses or use a hand sanitizer.   Avoid other people with cold or virus symptoms.   Try not to touch your hands to your mouth, nose, or eyes.  SEEK  IMMEDIATE MEDICAL CARE IF:  You develop increased fever, chills, or chest pain.   You have severe shortness of breath or bloody sputum.   You develop dehydration, fainting, repeated vomiting, or a severe headache.   You have no improvement after 1 week of treatment or you get worse.  MAKE SURE YOU:   Understand these instructions.   Will watch your condition.   Will get help right away if you are not doing well or get worse.  Document Released: 11/15/2004 Document Revised: 09/27/2011 Document Reviewed: 01/31/2011 Exit[employer] Patient Information 2012 [employer].Hypertension As your heart beats, it forces blood through your arteries. This force is your blood pressure. If the pressure is too high, it is called hypertension (HTN) or high blood pressure. HTN is dangerous because you may have it and not know it. High blood pressure may mean that your heart has to work harder to pump blood. Your arteries may be narrow or stiff. The extra work puts you at risk for heart disease, stroke, and other problems.  Blood pressure consists of two numbers, a higher number over a lower, 110/72, for example. It is stated as "110 over 72." The ideal is below 120 for the top number (systolic) and under 80 for the bottom (diastolic). Write down your blood pressure today. You should pay close attention to your blood pressure if you have certain conditions such as:  Heart failure.   Prior heart attack.   Diabetes   Chronic kidney disease.   Prior stroke.   Multiple risk factors for heart disease.  To   see if you have HTN, your blood pressure should be measured while you are seated with your arm held at the level of the heart. It should be measured at least twice. A one-time elevated blood pressure reading (especially in the Emergency Department) does not mean that you need treatment. There may be conditions in which the blood pressure is different between your right and left arms. It is important to see  your caregiver soon for a recheck. Most people have essential hypertension which means that there is not a specific cause. This type of high blood pressure may be lowered by changing lifestyle factors such as:  Stress.   Smoking.   Lack of exercise.   Excessive weight.   Drug/tobacco/alcohol use.   Eating less salt.  Most people do not have symptoms from high blood pressure until it has caused damage to the body. Effective treatment can often prevent, delay or reduce that damage. TREATMENT  When a cause has been identified, treatment for high blood pressure is directed at the cause. There are a large number of medications to treat HTN. These fall into several categories, and your caregiver will help you select the medicines that are best for you. Medications may have side effects. You should review side effects with your caregiver. If your blood pressure stays high after you have made lifestyle changes or started on medicines,   Your medication(s) may need to be changed.   Other problems may need to be addressed.   Be certain you understand your prescriptions, and know how and when to take your medicine.   Be sure to follow up with your caregiver within the time frame advised (usually within two weeks) to have your blood pressure rechecked and to review your medications.   If you are taking more than one medicine to lower your blood pressure, make sure you know how and at what times they should be taken. Taking two medicines at the same time can result in blood pressure that is too low.  SEEK IMMEDIATE MEDICAL CARE IF:  You develop a severe headache, blurred or changing vision, or confusion.   You have unusual weakness or numbness, or a faint feeling.   You have severe chest or abdominal pain, vomiting, or breathing problems.  MAKE SURE YOU:   Understand these instructions.   Will watch your condition.   Will get help right away if you are not doing well or get worse.    Document Released: 10/08/2005 Document Revised: 09/27/2011 Document Reviewed: 05/28/2008 Exit[employer] Patient Information 2012 [employer]. 

## 2012-01-20 NOTE — Assessment & Plan Note (Signed)
Plain film today to look for djd, spurs, lesions

## 2012-01-20 NOTE — Assessment & Plan Note (Signed)
I will check her a1c today 

## 2012-01-20 NOTE — Assessment & Plan Note (Signed)
She will have to stop the ACEI so I have switched her to [facility]

## 2012-01-20 NOTE — Assessment & Plan Note (Signed)
I think her cough has been induced by the ACEI so I have stopped that, today I will check her CXR to look for pna, mass, edema

## 2012-01-20 NOTE — Assessment & Plan Note (Signed)
She has no s/s of blood loss, I will recheck her CBC today 

## 2012-01-20 NOTE — Assessment & Plan Note (Signed)
Start zpak for the infection 

## 2012-01-20 NOTE — Assessment & Plan Note (Signed)
She'll try celebrex for the pain

## 2012-01-20 NOTE — Assessment & Plan Note (Signed)
She will get a plain film done today to look for djd, spurring, lesions

## 2012-02-06 ENCOUNTER — Telehealth: Payer: Self-pay

## 2012-02-06 MED ORDER — FLUTICASONE PROPIONATE 50 MCG/ACT NA SUSP: 2.0000 | Freq: Every day | NASAL | Status: DC

## 2012-02-06 NOTE — Telephone Encounter (Signed)
Patient filled out walk in sheet mon 02/04/12 stating that OTC allergy medication is not working for her. She requesting MD send in a rx for her Thanks

## 2012-02-06 NOTE — Telephone Encounter (Signed)
done

## 2012-02-28 ENCOUNTER — Ambulatory Visit: Payer: Managed Care, Other (non HMO) | Admitting: Internal Medicine

## 2012-02-29 ENCOUNTER — Ambulatory Visit: Payer: Managed Care, Other (non HMO) | Admitting: Internal Medicine

## 2012-03-12 ENCOUNTER — Encounter: Payer: Self-pay | Admitting: Internal Medicine

## 2012-03-12 ENCOUNTER — Ambulatory Visit (INDEPENDENT_AMBULATORY_CARE_PROVIDER_SITE_OTHER): Payer: Managed Care, Other (non HMO) | Admitting: Internal Medicine

## 2012-03-12 VITALS — BP 142/80 | HR 67 | Temp 97.1°F | Resp 16 | Wt 202.0 lb

## 2012-03-12 DIAGNOSIS — I10 Essential (primary) hypertension: Secondary | ICD-10-CM

## 2012-03-12 DIAGNOSIS — M1990 Unspecified osteoarthritis, unspecified site: Secondary | ICD-10-CM

## 2012-03-12 DIAGNOSIS — D649 Anemia, unspecified: Secondary | ICD-10-CM

## 2012-03-12 MED ORDER — NEBIVOLOL HCL 10 MG PO TABS: 10.0000 mg | ORAL_TABLET | Freq: Every day | ORAL | Status: DC

## 2012-03-12 NOTE — Progress Notes (Signed)
  Subjective:    Patient ID: Teresa Collins, female    DOB: [birth year], 61 y.o.   MRN: [id number]  Hypertension This is a chronic problem. The current episode started more than 1 year ago. The problem has been gradually improving since onset. The problem is controlled. Pertinent negatives include no anxiety, blurred vision, chest pain, headaches, malaise/fatigue, neck pain, orthopnea, palpitations, peripheral edema, PND, shortness of breath or sweats. Past treatments include angiotensin blockers and diuretics. The current treatment provides moderate improvement. Compliance problems include exercise, diet and medication side effects (she thinks the Benicar-HCT makes her feel "swollen").       Review of Systems  Constitutional: Positive for unexpected weight change (some weight gain). Negative for fever, chills, malaise/fatigue, diaphoresis, activity change, appetite change and fatigue.  HENT: Negative.  Negative for neck pain.   Eyes: Negative.  Negative for blurred vision.  Respiratory: Negative for apnea, cough, choking, chest tightness, shortness of breath, wheezing and stridor.   Cardiovascular: Negative for chest pain, palpitations, orthopnea, leg swelling and PND.  Gastrointestinal: Negative for nausea, vomiting, abdominal pain, diarrhea and constipation.  Genitourinary: Negative.   Musculoskeletal: Negative for myalgias, back pain, joint swelling, arthralgias and gait problem.  Skin: Negative for color change, pallor, rash and wound.  Neurological: Negative for dizziness, tremors, seizures, syncope, facial asymmetry, speech difficulty, weakness, light-headedness, numbness and headaches.  Hematological: Negative for adenopathy. Does not bruise/bleed easily.  Psychiatric/Behavioral: Negative.        Objective:   Physical Exam  Vitals reviewed. Constitutional: She is oriented to person, place, and time. She appears well-developed and well-nourished. No distress.  HENT:  Head:  Normocephalic and atraumatic.  Mouth/Throat: Oropharynx is clear and moist. No oropharyngeal exudate.  Eyes: Conjunctivae are normal. Right eye exhibits no discharge. Left eye exhibits no discharge. No scleral icterus.  Neck: Normal range of motion. Neck supple. No JVD present. No tracheal deviation present. No thyromegaly present.  Cardiovascular: Normal rate, regular rhythm, normal heart sounds and intact distal pulses.  Exam reveals no gallop and no friction rub.   No murmur heard. Pulmonary/Chest: Effort normal and breath sounds normal. No stridor. No respiratory distress. She has no wheezes. She has no rales. She exhibits no tenderness.  Abdominal: Soft. Bowel sounds are normal. She exhibits no distension and no mass. There is no tenderness. There is no rebound and no guarding.  Musculoskeletal: Normal range of motion. She exhibits no edema and no tenderness.  Lymphadenopathy:    She has no cervical adenopathy.  Neurological: She is oriented to person, place, and time.  Skin: Skin is warm and dry. No rash noted. She is not diaphoretic. No erythema. No pallor.  Psychiatric: She has a normal mood and affect. Her behavior is normal. Judgment and thought content normal.     Lab Results  Component Value Date   WBC 4.9 01/17/2012   HGB 13.9 01/17/2012   HCT 41.0 01/17/2012   PLT 180.0 01/17/2012   GLUCOSE 108* 01/17/2012   CHOL 195 01/17/2012   TRIG 66.0 01/17/2012   HDL 50.10 01/17/2012   LDLCALC 132* 01/17/2012   ALT 13 01/17/2012   AST 13 01/17/2012   NA 138 01/17/2012   K 4.1 01/17/2012   CL 101 01/17/2012   CREATININE 0.9 01/17/2012   BUN 28* 01/17/2012   CO2 29 01/17/2012   TSH 1.05 01/17/2012   HGBA1C 5.8 01/17/2012       Assessment & Plan:

## 2012-03-12 NOTE — Patient Instructions (Signed)

## 2012-03-13 NOTE — Assessment & Plan Note (Signed)
Her recent cbc looks real good

## 2012-03-13 NOTE — Assessment & Plan Note (Signed)
She is convinced that Benicar-HCT has caused weight gain so at her request that has been stopped and she will start bystolic for BP control and she agrees to work on her lifestyle modifications with diet an exercise

## 2012-03-13 NOTE — Assessment & Plan Note (Signed)
This was confirmed by xray but she has been free of pain for several weeks now

## 2012-03-26 ENCOUNTER — Telehealth: Payer: Self-pay | Admitting: Internal Medicine

## 2012-03-26 NOTE — Telephone Encounter (Signed)
Caller: Shalen/Patient; PCP: Sanda Linger; CB#: [phone];  Call regarding Edema;  Teresa Collins was placed on Bystolic on 03/13/12.  Developed a headache on 03/16/12.  Developed SOB the day she started it.  Continuing to have SOB.  Also continuing to have generalized edema.  Edema has worsened today.  Denies difficulty breathing but c/o mild SOB with activity.  Utilized Edema, Atraumatic Guideline.  ED disposition.  Offered appt in office today.  Pt declines because she is out of town.  Advised ED.

## 2012-05-14 ENCOUNTER — Ambulatory Visit: Payer: Managed Care, Other (non HMO) | Admitting: Internal Medicine

## 2012-05-17 ENCOUNTER — Other Ambulatory Visit: Payer: Self-pay | Admitting: Internal Medicine

## 2012-05-20 ENCOUNTER — Ambulatory Visit (INDEPENDENT_AMBULATORY_CARE_PROVIDER_SITE_OTHER): Payer: Managed Care, Other (non HMO) | Admitting: Internal Medicine

## 2012-05-20 ENCOUNTER — Encounter: Payer: Self-pay | Admitting: Internal Medicine

## 2012-05-20 VITALS — BP 122/84 | HR 59 | Temp 98.4°F | Resp 16 | Wt 199.5 lb

## 2012-05-20 DIAGNOSIS — I10 Essential (primary) hypertension: Secondary | ICD-10-CM

## 2012-05-20 NOTE — Progress Notes (Signed)
  Subjective:    Patient ID: Teresa Collins, female    DOB: 03-05-1951, 61 y.o.   MRN: [id number]  Hypertension This is a chronic problem. The current episode started more than 1 year ago. The problem has been gradually improving since onset. The problem is controlled. Pertinent negatives include no anxiety, blurred vision, chest pain, headaches, malaise/fatigue, neck pain, orthopnea, palpitations, peripheral edema, PND, shortness of breath or sweats. Past treatments include ACE inhibitors and diuretics. The current treatment provides significant improvement. Compliance problems include exercise and diet.       Review of Systems  Constitutional: Negative.  Negative for malaise/fatigue.  HENT: Negative.  Negative for neck pain.   Eyes: Negative.  Negative for blurred vision.  Respiratory: Negative for chest tightness, shortness of breath, wheezing and stridor.   Cardiovascular: Negative for chest pain, palpitations, orthopnea, leg swelling and PND.  Gastrointestinal: Negative for nausea, vomiting, abdominal pain, diarrhea and constipation.  Genitourinary: Negative.   Musculoskeletal: Negative.   Skin: Negative for color change, pallor, rash and wound.  Neurological: Negative.  Negative for headaches.  Hematological: Negative for adenopathy. Does not bruise/bleed easily.  Psychiatric/Behavioral: Negative.        Objective:   Physical Exam  Vitals reviewed. Constitutional: She is oriented to person, place, and time. She appears well-developed and well-nourished.  HENT:  Head: Normocephalic and atraumatic.  Mouth/Throat: Oropharynx is clear and moist. No oropharyngeal exudate.  Eyes: Conjunctivae are normal. Right eye exhibits no discharge. Left eye exhibits no discharge. No scleral icterus.  Neck: Normal range of motion. Neck supple. No JVD present. No tracheal deviation present. No thyromegaly present.  Cardiovascular: Normal rate, regular rhythm, normal heart sounds and intact  distal pulses.  Exam reveals no gallop and no friction rub.   No murmur heard. Pulmonary/Chest: Effort normal and breath sounds normal. No stridor. No respiratory distress. She has no wheezes. She has no rales. She exhibits no tenderness.  Abdominal: Soft. Bowel sounds are normal. She exhibits no distension and no mass. There is no tenderness. There is no rebound and no guarding.  Musculoskeletal: Normal range of motion. She exhibits no edema and no tenderness.  Lymphadenopathy:    She has no cervical adenopathy.  Neurological: She is oriented to person, place, and time.  Skin: Skin is warm and dry. No rash noted. She is not diaphoretic. No erythema. No pallor.  Psychiatric: She has a normal mood and affect. Her behavior is normal. Judgment and thought content normal.      Lab Results  Component Value Date   WBC 4.9 01/17/2012   HGB 13.9 01/17/2012   HCT 41.0 01/17/2012   PLT 180.0 01/17/2012   GLUCOSE 108* 01/17/2012   CHOL 195 01/17/2012   TRIG 66.0 01/17/2012   HDL 50.10 01/17/2012   LDLCALC 132* 01/17/2012   ALT 13 01/17/2012   AST 13 01/17/2012   NA 138 01/17/2012   K 4.1 01/17/2012   CL 101 01/17/2012   CREATININE 0.9 01/17/2012   BUN 28* 01/17/2012   CO2 29 01/17/2012   TSH 1.05 01/17/2012   HGBA1C 5.8 01/17/2012     Assessment & Plan:

## 2012-05-20 NOTE — Patient Instructions (Signed)

## 2012-05-20 NOTE — Assessment & Plan Note (Signed)
Her BP is well controlled 

## 2012-09-09 ENCOUNTER — Ambulatory Visit: Payer: Managed Care, Other (non HMO) | Admitting: Internal Medicine

## 2012-09-16 ENCOUNTER — Encounter: Payer: Self-pay | Admitting: Internal Medicine

## 2012-09-16 ENCOUNTER — Ambulatory Visit (INDEPENDENT_AMBULATORY_CARE_PROVIDER_SITE_OTHER): Payer: Managed Care, Other (non HMO) | Admitting: Internal Medicine

## 2012-09-16 ENCOUNTER — Other Ambulatory Visit (INDEPENDENT_AMBULATORY_CARE_PROVIDER_SITE_OTHER): Payer: Managed Care, Other (non HMO)

## 2012-09-16 VITALS — BP 128/80 | HR 73 | Temp 97.3°F | Resp 16 | Wt 197.0 lb

## 2012-09-16 DIAGNOSIS — D649 Anemia, unspecified: Secondary | ICD-10-CM

## 2012-09-16 DIAGNOSIS — R7309 Other abnormal glucose: Secondary | ICD-10-CM

## 2012-09-16 DIAGNOSIS — Z1231 Encounter for screening mammogram for malignant neoplasm of breast: Secondary | ICD-10-CM

## 2012-09-16 DIAGNOSIS — I10 Essential (primary) hypertension: Secondary | ICD-10-CM

## 2012-09-16 LAB — CBC WITH DIFFERENTIAL/PLATELET
Basophils Absolute: 0 10*3/uL (ref 0.0–0.1)
Eosinophils Relative: 2.6 % (ref 0.0–5.0)
HCT: 40.2 % (ref 36.0–46.0)
Lymphs Abs: 1.4 10*3/uL (ref 0.7–4.0)
MCV: 89.7 fl (ref 78.0–100.0)
Monocytes Absolute: 0.7 10*3/uL (ref 0.1–1.0)
Platelets: 169 10*3/uL (ref 150.0–400.0)
RDW: 13.3 % (ref 11.5–14.6)

## 2012-09-16 LAB — BASIC METABOLIC PANEL
BUN: 18 mg/dL (ref 6–23)
CO2: 32 mEq/L (ref 19–32)
Chloride: 104 mEq/L (ref 96–112)
Glucose, Bld: 108 mg/dL — ABNORMAL HIGH (ref 70–99)
Potassium: 4.6 mEq/L (ref 3.5–5.1)

## 2012-09-16 NOTE — Patient Instructions (Signed)

## 2012-09-16 NOTE — Progress Notes (Signed)
  Subjective:    Patient ID: Teresa Collins, female    DOB: February 07, 1951, 61 y.o.   MRN: [id number]  Hypertension This is a chronic problem. The current episode started more than 1 year ago. The problem has been gradually improving since onset. The problem is controlled. Pertinent negatives include no anxiety, blurred vision, chest pain, headaches, malaise/fatigue, neck pain, orthopnea, palpitations, peripheral edema, PND, shortness of breath or sweats. There are no associated agents to hypertension. Past treatments include ACE inhibitors and diuretics. The current treatment provides significant improvement. Compliance problems include exercise and diet.       Review of Systems  Constitutional: Negative.  Negative for malaise/fatigue.  HENT: Negative.  Negative for neck pain.   Eyes: Negative.  Negative for blurred vision.  Respiratory: Negative.  Negative for shortness of breath.   Cardiovascular: Negative.  Negative for chest pain, palpitations, orthopnea and PND.  Gastrointestinal: Negative.   Genitourinary: Negative.   Musculoskeletal: Negative.   Skin: Negative.   Neurological: Negative.  Negative for headaches.  Hematological: Negative.   Psychiatric/Behavioral: Negative.        Objective:   Physical Exam  Vitals reviewed. Constitutional: She is oriented to person, place, and time. She appears well-developed and well-nourished. No distress.  HENT:  Head: Normocephalic and atraumatic.  Mouth/Throat: Oropharynx is clear and moist. No oropharyngeal exudate.  Eyes: Conjunctivae normal are normal. Right eye exhibits no discharge. Left eye exhibits no discharge. No scleral icterus.  Neck: Normal range of motion. Neck supple. No JVD present. No tracheal deviation present. No thyromegaly present.  Cardiovascular: Normal rate, regular rhythm, normal heart sounds and intact distal pulses.  Exam reveals no gallop and no friction rub.   No murmur heard. Pulmonary/Chest: Effort normal and  breath sounds normal. No stridor. No respiratory distress. She has no wheezes. She has no rales. She exhibits no tenderness.  Abdominal: Soft. Bowel sounds are normal. She exhibits no distension and no mass. There is no tenderness. There is no rebound and no guarding.  Musculoskeletal: Normal range of motion. She exhibits no edema and no tenderness.  Lymphadenopathy:    She has no cervical adenopathy.  Neurological: She is oriented to person, place, and time.  Skin: Skin is warm and dry. No rash noted. She is not diaphoretic. No erythema. No pallor.  Psychiatric: She has a normal mood and affect. Her behavior is normal. Judgment and thought content normal.      Lab Results  Component Value Date   WBC 4.9 01/17/2012   HGB 13.9 01/17/2012   HCT 41.0 01/17/2012   PLT 180.0 01/17/2012   GLUCOSE 108* 01/17/2012   CHOL 195 01/17/2012   TRIG 66.0 01/17/2012   HDL 50.10 01/17/2012   LDLCALC 132* 01/17/2012   ALT 13 01/17/2012   AST 13 01/17/2012   NA 138 01/17/2012   K 4.1 01/17/2012   CL 101 01/17/2012   CREATININE 0.9 01/17/2012   BUN 28* 01/17/2012   CO2 29 01/17/2012   TSH 1.05 01/17/2012   HGBA1C 5.8 01/17/2012      Assessment & Plan:

## 2012-09-16 NOTE — Assessment & Plan Note (Signed)
I will recheck her CBC today 

## 2012-09-16 NOTE — Assessment & Plan Note (Signed)
I will check her a1c to see if she has developed DM II 

## 2012-09-16 NOTE — Assessment & Plan Note (Signed)
Her BP is well controlled, I will check her lytes and renal function 

## 2012-10-08 ENCOUNTER — Ambulatory Visit (HOSPITAL_COMMUNITY)
Admission: RE | Admit: 2012-10-08 | Discharge: 2012-10-08 | Disposition: A | Discharge status: Home / Self Care | Payer: Managed Care, Other (non HMO) | Source: Ambulatory Visit | Attending: Internal Medicine | Admitting: Internal Medicine

## 2012-10-08 DIAGNOSIS — Z1231 Encounter for screening mammogram for malignant neoplasm of breast: Secondary | ICD-10-CM

## 2012-10-10 LAB — HM MAMMOGRAPHY: HM Mammogram: NORMAL

## 2012-12-09 ENCOUNTER — Ambulatory Visit: Payer: Managed Care, Other (non HMO) | Admitting: Internal Medicine

## 2012-12-09 ENCOUNTER — Ambulatory Visit (INDEPENDENT_AMBULATORY_CARE_PROVIDER_SITE_OTHER): Payer: Managed Care, Other (non HMO) | Admitting: Internal Medicine

## 2012-12-09 ENCOUNTER — Encounter: Payer: Self-pay | Admitting: Internal Medicine

## 2012-12-09 VITALS — BP 138/88 | HR 67 | Temp 97.2°F | Ht 61.0 in | Wt 203.0 lb

## 2012-12-09 DIAGNOSIS — IMO0002 Reserved for concepts with insufficient information to code with codable children: Secondary | ICD-10-CM

## 2012-12-09 DIAGNOSIS — S76011A Strain of muscle, fascia and tendon of right hip, initial encounter: Secondary | ICD-10-CM

## 2012-12-09 MED ORDER — KETOROLAC TROMETHAMINE 30 MG/ML IJ SOLN
30.0000 mg | Freq: Once | INTRAMUSCULAR | Status: AC
  Administered 2012-12-09: 30 mg via INTRAMUSCULAR

## 2012-12-09 MED ORDER — PREDNISONE 10 MG PO TABS: ORAL_TABLET | ORAL | Status: DC

## 2012-12-09 MED ORDER — METHYLPREDNISOLONE ACETATE 80 MG/ML IJ SUSP
80.0000 mg | Freq: Once | INTRAMUSCULAR | Status: AC
  Administered 2012-12-09: 80 mg via INTRAMUSCULAR

## 2012-12-09 MED ORDER — CYCLOBENZAPRINE HCL 10 MG PO TABS: 10.0000 mg | ORAL_TABLET | Freq: Three times a day (TID) | ORAL | Status: DC | PRN

## 2012-12-09 MED ORDER — KETOROLAC TROMETHAMINE 60 MG/2ML IJ SOLN: 30.0000 mg | Freq: Once | INTRAMUSCULAR | Status: DC

## 2012-12-09 NOTE — Progress Notes (Signed)
Subjective:    Patient ID: Teresa Collins, female    DOB: Dec 03, 1950, 62 y.o.   MRN: [id number]  HPI  Pt presents to the clinic today with c/o right hip pain. This started 6 days ago. She slipped on some ice but caught herself. She did not fall. The area was sore for the first 4 days but has gotten much worse over the last 2 days. She is having trouble sitting and standing due to the pain. The pain is worse when she tries to bend down or roll over in bed. She has taken 600 mg of Ibuprofen with only mild relief. She has never had pain like this in her hip before. She denies numbness or tingling down into her right leg. She denies loss of bowel or bladder.  Review of Systems  Past Medical History  Diagnosis Date  . Hypertension   . GERD (gastroesophageal reflux disease)   . Eczema   . Hx of colonic polyps   . Vertigo     Dx 10/16/11    Current Outpatient Prescriptions  Medication Sig Dispense Refill  . Cholecalciferol (VITAMIN D) 2000 UNITS CAPS Take by mouth.        . desoximetasone (TOPICORT) 0.05 % cream Apply topically 2 (two) times daily.        Marland Kitchen lisinopril-hydrochlorothiazide (PRINZIDE,ZESTORETIC) 10-12.5 MG per tablet TAKE ONE TABLET BY MOUTH EVERY DAY  90 tablet  3  . Multiple Vitamins-Minerals (CENTRUM ULTRA WOMENS) TABS Take 1 tablet by mouth daily.        Marland Kitchen VITAMIN E PO Take by mouth.        . Chlorpheniramine-APAP (CORICIDIN) 2-325 MG TABS Take 1 tablet by mouth every 6 (six) hours as needed. For head cold relief       . cyclobenzaprine (FLEXERIL) 10 MG tablet Take 1 tablet (10 mg total) by mouth 3 (three) times daily as needed for muscle spasms.  30 tablet  0  . predniSONE (DELTASONE) 10 MG tablet Take 3 tablets on days 1-3, Take 2 tablets on days 4-6, take 1 tablet on days 7-9  18 tablet  0   No current facility-administered medications for this visit.    Allergies  Allergen Reactions  . Codeine Sulfate Hives    REACTION: rash and itching  . Latex Rash     Family History  Problem Relation Age of Onset  . Hypertension Other   . Cancer Other     Uterine Cancer    History   Social History  . Marital Status: Single    Spouse Name: N/A    Number of Children: N/A  . Years of Education: N/A   Occupational History  . Not on file.   Social History Main Topics  . Smoking status: Never Smoker   . Smokeless tobacco: Never Used  . Alcohol Use: No  . Drug Use: No  . Sexually Active: Not Currently    Birth Control/ Protection: Post-menopausal   Other Topics Concern  . Not on file   Social History Narrative  . No narrative on file     Constitutional: Denies fever, malaise, fatigue, headache or abrupt weight changes.  Musculoskeletal: Pt reports pain in right hip. Denies decrease in range of motion, or joint pain and swelling.   Neurological: Denies dizziness, difficulty with memory, difficulty with speech or problems with balance and coordination.   No other specific complaints in a complete review of systems (except as listed in HPI above).     Objective:  Physical Exam   BP 138/88  Pulse 67  Temp(Src) 97.2 F (36.2 C) (Oral)  Ht 5\' 1"  (1.549 m)  Wt 203 lb (92.08 kg)  BMI 38.38 kg/m2  SpO2 98% Wt Readings from Last 3 Encounters:  12/09/12 203 lb (92.08 kg)  09/16/12 197 lb (89.359 kg)  05/20/12 199 lb 8 oz (90.493 kg)    General: Appears her stated age, well developed, well nourished in NAD. Cardiovascular: Normal rate and rhythm. S1,S2 noted.  No murmur, rubs or gallops noted. No JVD or BLE edema. No carotid bruits noted. Pulmonary/Chest: Normal effort and positive vesicular breath sounds. No respiratory distress. No wheezes, rales or ronchi noted.  Abdomen: Soft and nontender. Normal bowel sounds, no bruits noted. No distention or masses noted. Liver, spleen and kidneys non palpable. Musculoskeletal: Decrease internal and external range of motion of the right hip secondary to pain. No tenderness of the bursa. No  signs of joint swelling. Neurological: Alert and oriented. Cranial nerves II-XII intact. Coordination normal. +DTRs bilaterally.       Assessment & Plan:   Muscle Strain of the right gluteal region, new onset with additional workup requried:  Perform stretching exercises as indicated on handout eRx for Pred taper eRx for flexeril 10 mg TID prn 80 mg Depo IM today 30 mg Toradol IM today  RTC as needed or if symptoms persist

## 2012-12-09 NOTE — Patient Instructions (Signed)
Muscle Strain °Muscle strain occurs when a muscle is stretched beyond its normal length. A small number of muscle fibers generally are torn. This is especially common in athletes. This happens when a sudden, violent force placed on a muscle stretches it too far. Usually, recovery from muscle strain takes 1 to 2 weeks. Complete healing will take 5 to 6 weeks.  °HOME CARE INSTRUCTIONS  °· While awake, apply ice to the sore muscle for the first 2 days after the injury. °· Put ice in a plastic bag. °· Place a towel between your skin and the bag. °· Leave the ice on for 15 to 20 minutes each hour. °· Do not use the strained muscle for several days, until you no longer have pain. °· You may wrap the injured area with an elastic bandage for comfort. Be careful not to wrap it too tightly. This may interfere with blood circulation or increase swelling. °· Only take over-the-counter or prescription medicines for pain, discomfort, or fever as directed by your caregiver. °SEEK MEDICAL CARE IF:  °You have increasing pain or swelling in the injured area. °MAKE SURE YOU:  °· Understand these instructions. °· Will watch your condition. °· Will get help right away if you are not doing well or get worse. °Document Released: 10/08/2005 Document Revised: 12/31/2011 Document Reviewed: 10/20/2011 °[employer]® Patient Information ©2013 [employer]. ° °

## 2012-12-09 NOTE — Addendum Note (Signed)
Addended by: Carin Primrose on: 12/09/2012 10:45 AM   Modules accepted: Orders

## 2013-03-31 ENCOUNTER — Ambulatory Visit (INDEPENDENT_AMBULATORY_CARE_PROVIDER_SITE_OTHER): Payer: Managed Care, Other (non HMO) | Admitting: Internal Medicine

## 2013-03-31 ENCOUNTER — Encounter: Payer: Self-pay | Admitting: Internal Medicine

## 2013-03-31 ENCOUNTER — Other Ambulatory Visit (INDEPENDENT_AMBULATORY_CARE_PROVIDER_SITE_OTHER): Payer: Managed Care, Other (non HMO)

## 2013-03-31 VITALS — BP 146/82 | HR 67 | Temp 97.9°F | Resp 16 | Wt 196.1 lb

## 2013-03-31 DIAGNOSIS — Z0000 Encounter for general adult medical examination without abnormal findings: Secondary | ICD-10-CM

## 2013-03-31 DIAGNOSIS — I10 Essential (primary) hypertension: Secondary | ICD-10-CM

## 2013-03-31 DIAGNOSIS — R197 Diarrhea, unspecified: Secondary | ICD-10-CM | POA: Insufficient documentation

## 2013-03-31 LAB — CBC WITH DIFFERENTIAL/PLATELET
Basophils Relative: 0.5 % (ref 0.0–3.0)
Eosinophils Absolute: 0.2 10*3/uL (ref 0.0–0.7)
Hemoglobin: 14.1 g/dL (ref 12.0–15.0)
MCHC: 34.1 g/dL (ref 30.0–36.0)
MCV: 89.5 fl (ref 78.0–100.0)
Monocytes Absolute: 0.6 10*3/uL (ref 0.1–1.0)
Neutro Abs: 3.1 10*3/uL (ref 1.4–7.7)
RBC: 4.61 Mil/uL (ref 3.87–5.11)

## 2013-03-31 LAB — LIPID PANEL
Cholesterol: 238 mg/dL — ABNORMAL HIGH (ref 0–200)
Total CHOL/HDL Ratio: 5
VLDL: 21.4 mg/dL (ref 0.0–40.0)

## 2013-03-31 LAB — COMPREHENSIVE METABOLIC PANEL
AST: 18 U/L (ref 0–37)
Alkaline Phosphatase: 71 U/L (ref 39–117)
BUN: 18 mg/dL (ref 6–23)
Creatinine, Ser: 1 mg/dL (ref 0.4–1.2)
Potassium: 4.1 mEq/L (ref 3.5–5.1)

## 2013-03-31 NOTE — Patient Instructions (Signed)
Preventive Care for Adults, Female A healthy lifestyle and preventive care can promote health and wellness. Preventive health guidelines for women include the following key practices.  A routine yearly physical is a good way to check with your caregiver about your health and preventive screening. It is a chance to share any concerns and updates on your health, and to receive a thorough exam.  Visit your dentist for a routine exam and preventive care every 6 months. Brush your teeth twice a day and floss once a day. Good oral hygiene prevents tooth decay and gum disease.  The frequency of eye exams is based on your age, health, family medical history, use of contact lenses, and other factors. Follow your caregiver's recommendations for frequency of eye exams.  Eat a healthy diet. Foods like vegetables, fruits, whole grains, low-fat dairy products, and lean protein foods contain the nutrients you need without too many calories. Decrease your intake of foods high in solid fats, added sugars, and salt. Eat the right amount of calories for you.Get information about a proper diet from your caregiver, if necessary.  Regular physical exercise is one of the most important things you can do for your health. Most adults should get at least 150 minutes of moderate-intensity exercise (any activity that increases your heart rate and causes you to sweat) each week. In addition, most adults need muscle-strengthening exercises on 2 or more days a week.  Maintain a healthy weight. The body mass index (BMI) is a screening tool to identify possible weight problems. It provides an estimate of body fat based on height and weight. Your caregiver can help determine your BMI, and can help you achieve or maintain a healthy weight.For adults 20 years and older:  A BMI below 18.5 is considered underweight.  A BMI of 18.5 to 24.9 is normal.  A BMI of 25 to 29.9 is considered overweight.  A BMI of 30 and above is  considered obese.  Maintain normal blood lipids and cholesterol levels by exercising and minimizing your intake of saturated fat. Eat a balanced diet with plenty of fruit and vegetables. Blood tests for lipids and cholesterol should begin at age 20 and be repeated every 5 years. If your lipid or cholesterol levels are high, you are over 50, or you are at high risk for heart disease, you may need your cholesterol levels checked more frequently.Ongoing high lipid and cholesterol levels should be treated with medicines if diet and exercise are not effective.  If you smoke, find out from your caregiver how to quit. If you do not use tobacco, do not start.  If you are pregnant, do not drink alcohol. If you are breastfeeding, be very cautious about drinking alcohol. If you are not pregnant and choose to drink alcohol, do not exceed 1 drink per day. One drink is considered to be 12 ounces (355 mL) of beer, 5 ounces (148 mL) of wine, or 1.5 ounces (44 mL) of liquor.  Avoid use of street drugs. Do not share needles with anyone. Ask for help if you need support or instructions about stopping the use of drugs.  High blood pressure causes heart disease and increases the risk of stroke. Your blood pressure should be checked at least every 1 to 2 years. Ongoing high blood pressure should be treated with medicines if weight loss and exercise are not effective.  If you are 55 to 61 years old, ask your caregiver if you should take aspirin to prevent strokes.  Diabetes   screening involves taking a blood sample to check your fasting blood sugar level. This should be done once every 3 years, after age 45, if you are within normal weight and without risk factors for diabetes. Testing should be considered at a younger age or be carried out more frequently if you are overweight and have at least 1 risk factor for diabetes.  Breast cancer screening is essential preventive care for women. You should practice "breast  self-awareness." This means understanding the normal appearance and feel of your breasts and may include breast self-examination. Any changes detected, no matter how small, should be reported to a caregiver. Women in their 20s and 30s should have a clinical breast exam (CBE) by a caregiver as part of a regular health exam every 1 to 3 years. After age 40, women should have a CBE every year. Starting at age 40, women should consider having a mammography (breast X-ray test) every year. Women who have a family history of breast cancer should talk to their caregiver about genetic screening. Women at a high risk of breast cancer should talk to their caregivers about having magnetic resonance imaging (MRI) and a mammography every year.  The Pap test is a screening test for cervical cancer. A Pap test can show cell changes on the cervix that might become cervical cancer if left untreated. A Pap test is a procedure in which cells are obtained and examined from the lower end of the uterus (cervix).  Women should have a Pap test starting at age 21.  Between ages 21 and 29, Pap tests should be repeated every 2 years.  Beginning at age 30, you should have a Pap test every 3 years as long as the past 3 Pap tests have been normal.  Some women have medical problems that increase the chance of getting cervical cancer. Talk to your caregiver about these problems. It is especially important to talk to your caregiver if a new problem develops soon after your last Pap test. In these cases, your caregiver may recommend more frequent screening and Pap tests.  The above recommendations are the same for women who have or have not gotten the vaccine for human papillomavirus (HPV).  If you had a hysterectomy for a problem that was not cancer or a condition that could lead to cancer, then you no longer need Pap tests. Even if you no longer need a Pap test, a regular exam is a good idea to make sure no other problems are  starting.  If you are between ages 65 and 70, and you have had normal Pap tests going back 10 years, you no longer need Pap tests. Even if you no longer need a Pap test, a regular exam is a good idea to make sure no other problems are starting.  If you have had past treatment for cervical cancer or a condition that could lead to cancer, you need Pap tests and screening for cancer for at least 20 years after your treatment.  If Pap tests have been discontinued, risk factors (such as a new sexual partner) need to be reassessed to determine if screening should be resumed.  The HPV test is an additional test that may be used for cervical cancer screening. The HPV test looks for the virus that can cause the cell changes on the cervix. The cells collected during the Pap test can be tested for HPV. The HPV test could be used to screen women aged 30 years and older, and should   be used in women of any age who have unclear Pap test results. After the age of 30, women should have HPV testing at the same frequency as a Pap test.  Colorectal cancer can be detected and often prevented. Most routine colorectal cancer screening begins at the age of 50 and continues through age 75. However, your caregiver may recommend screening at an earlier age if you have risk factors for colon cancer. On a yearly basis, your caregiver may provide home test kits to check for hidden blood in the stool. Use of a small camera at the end of a tube, to directly examine the colon (sigmoidoscopy or colonoscopy), can detect the earliest forms of colorectal cancer. Talk to your caregiver about this at age 50, when routine screening begins. Direct examination of the colon should be repeated every 5 to 10 years through age 75, unless early forms of pre-cancerous polyps or small growths are found.  Hepatitis C blood testing is recommended for all people born from 1945 through 1965 and any individual with known risks for hepatitis C.  Practice  safe sex. Use condoms and avoid high-risk sexual practices to reduce the spread of sexually transmitted infections (STIs). STIs include gonorrhea, chlamydia, syphilis, trichomonas, herpes, HPV, and human immunodeficiency virus (HIV). Herpes, HIV, and HPV are viral illnesses that have no cure. They can result in disability, cancer, and death. Sexually active women aged 25 and younger should be checked for chlamydia. Older women with new or multiple partners should also be tested for chlamydia. Testing for other STIs is recommended if you are sexually active and at increased risk.  Osteoporosis is a disease in which the bones lose minerals and strength with aging. This can result in serious bone fractures. The risk of osteoporosis can be identified using a bone density scan. Women ages 65 and over and women at risk for fractures or osteoporosis should discuss screening with their caregivers. Ask your caregiver whether you should take a calcium supplement or vitamin D to reduce the rate of osteoporosis.  Menopause can be associated with physical symptoms and risks. Hormone replacement therapy is available to decrease symptoms and risks. You should talk to your caregiver about whether hormone replacement therapy is right for you.  Use sunscreen with sun protection factor (SPF) of 30 or more. Apply sunscreen liberally and repeatedly throughout the day. You should seek shade when your shadow is shorter than you. Protect yourself by wearing long sleeves, pants, a wide-brimmed hat, and sunglasses year round, whenever you are outdoors.  Once a month, do a whole body skin exam, using a mirror to look at the skin on your back. Notify your caregiver of new moles, moles that have irregular borders, moles that are larger than a pencil eraser, or moles that have changed in shape or color.  Stay current with required immunizations.  Influenza. You need a dose every fall (or winter). The composition of the flu vaccine  changes each year, so being vaccinated once is not enough.  Pneumococcal polysaccharide. You need 1 to 2 doses if you smoke cigarettes or if you have certain chronic medical conditions. You need 1 dose at age 65 (or older) if you have never been vaccinated.  Tetanus, diphtheria, pertussis (Tdap, Td). Get 1 dose of Tdap vaccine if you are younger than age 65, are over 65 and have contact with an infant, are a healthcare worker, are pregnant, or simply want to be protected from whooping cough. After that, you need a Td   booster dose every 10 years. Consult your caregiver if you have not had at least 3 tetanus and diphtheria-containing shots sometime in your life or have a deep or dirty wound.  HPV. You need this vaccine if you are a woman age 26 or younger. The vaccine is given in 3 doses over 6 months.  Measles, mumps, rubella (MMR). You need at least 1 dose of MMR if you were born in 1957 or later. You may also need a second dose.  Meningococcal. If you are age 19 to 21 and a first-year college student living in a residence hall, or have one of several medical conditions, you need to get vaccinated against meningococcal disease. You may also need additional booster doses.  Zoster (shingles). If you are age 60 or older, you should get this vaccine.  Varicella (chickenpox). If you have never had chickenpox or you were vaccinated but received only 1 dose, talk to your caregiver to find out if you need this vaccine.  Hepatitis A. You need this vaccine if you have a specific risk factor for hepatitis A virus infection or you simply wish to be protected from this disease. The vaccine is usually given as 2 doses, 6 to 18 months apart.  Hepatitis B. You need this vaccine if you have a specific risk factor for hepatitis B virus infection or you simply wish to be protected from this disease. The vaccine is given in 3 doses, usually over 6 months. Preventive Services / Frequency Ages 19 to 39  Blood  pressure check.** / Every 1 to 2 years.  Lipid and cholesterol check.** / Every 5 years beginning at age 20.  Clinical breast exam.** / Every 3 years for women in their 20s and 30s.  Pap test.** / Every 2 years from ages 21 through 29. Every 3 years starting at age 30 through age 65 or 70 with a history of 3 consecutive normal Pap tests.  HPV screening.** / Every 3 years from ages 30 through ages 65 to 70 with a history of 3 consecutive normal Pap tests.  Hepatitis C blood test.** / For any individual with known risks for hepatitis C.  Skin self-exam. / Monthly.  Influenza immunization.** / Every year.  Pneumococcal polysaccharide immunization.** / 1 to 2 doses if you smoke cigarettes or if you have certain chronic medical conditions.  Tetanus, diphtheria, pertussis (Tdap, Td) immunization. / A one-time dose of Tdap vaccine. After that, you need a Td booster dose every 10 years.  HPV immunization. / 3 doses over 6 months, if you are 26 and younger.  Measles, mumps, rubella (MMR) immunization. / You need at least 1 dose of MMR if you were born in 1957 or later. You may also need a second dose.  Meningococcal immunization. / 1 dose if you are age 19 to 21 and a first-year college student living in a residence hall, or have one of several medical conditions, you need to get vaccinated against meningococcal disease. You may also need additional booster doses.  Varicella immunization.** / Consult your caregiver.  Hepatitis A immunization.** / Consult your caregiver. 2 doses, 6 to 18 months apart.  Hepatitis B immunization.** / Consult your caregiver. 3 doses usually over 6 months. Ages 40 to 64  Blood pressure check.** / Every 1 to 2 years.  Lipid and cholesterol check.** / Every 5 years beginning at age 20.  Clinical breast exam.** / Every year after age 40.  Mammogram.** / Every year beginning at age 40   and continuing for as long as you are in good health. Consult with your  caregiver.  Pap test.** / Every 3 years starting at age 30 through age 65 or 70 with a history of 3 consecutive normal Pap tests.  HPV screening.** / Every 3 years from ages 30 through ages 65 to 70 with a history of 3 consecutive normal Pap tests.  Fecal occult blood test (FOBT) of stool. / Every year beginning at age 50 and continuing until age 75. You may not need to do this test if you get a colonoscopy every 10 years.  Flexible sigmoidoscopy or colonoscopy.** / Every 5 years for a flexible sigmoidoscopy or every 10 years for a colonoscopy beginning at age 50 and continuing until age 75.  Hepatitis C blood test.** / For all people born from 1945 through 1965 and any individual with known risks for hepatitis C.  Skin self-exam. / Monthly.  Influenza immunization.** / Every year.  Pneumococcal polysaccharide immunization.** / 1 to 2 doses if you smoke cigarettes or if you have certain chronic medical conditions.  Tetanus, diphtheria, pertussis (Tdap, Td) immunization.** / A one-time dose of Tdap vaccine. After that, you need a Td booster dose every 10 years.  Measles, mumps, rubella (MMR) immunization. / You need at least 1 dose of MMR if you were born in 1957 or later. You may also need a second dose.  Varicella immunization.** / Consult your caregiver.  Meningococcal immunization.** / Consult your caregiver.  Hepatitis A immunization.** / Consult your caregiver. 2 doses, 6 to 18 months apart.  Hepatitis B immunization.** / Consult your caregiver. 3 doses, usually over 6 months. Ages 65 and over  Blood pressure check.** / Every 1 to 2 years.  Lipid and cholesterol check.** / Every 5 years beginning at age 20.  Clinical breast exam.** / Every year after age 40.  Mammogram.** / Every year beginning at age 40 and continuing for as long as you are in good health. Consult with your caregiver.  Pap test.** / Every 3 years starting at age 30 through age 65 or 70 with a 3  consecutive normal Pap tests. Testing can be stopped between 65 and 70 with 3 consecutive normal Pap tests and no abnormal Pap or HPV tests in the past 10 years.  HPV screening.** / Every 3 years from ages 30 through ages 65 or 70 with a history of 3 consecutive normal Pap tests. Testing can be stopped between 65 and 70 with 3 consecutive normal Pap tests and no abnormal Pap or HPV tests in the past 10 years.  Fecal occult blood test (FOBT) of stool. / Every year beginning at age 50 and continuing until age 75. You may not need to do this test if you get a colonoscopy every 10 years.  Flexible sigmoidoscopy or colonoscopy.** / Every 5 years for a flexible sigmoidoscopy or every 10 years for a colonoscopy beginning at age 50 and continuing until age 75.  Hepatitis C blood test.** / For all people born from 1945 through 1965 and any individual with known risks for hepatitis C.  Osteoporosis screening.** / A one-time screening for women ages 65 and over and women at risk for fractures or osteoporosis.  Skin self-exam. / Monthly.  Influenza immunization.** / Every year.  Pneumococcal polysaccharide immunization.** / 1 dose at age 65 (or older) if you have never been vaccinated.  Tetanus, diphtheria, pertussis (Tdap, Td) immunization. / A one-time dose of Tdap vaccine if you are over   65 and have contact with an infant, are a Research scientist (physical sciences), or simply want to be protected from whooping cough. After that, you need a Td booster dose every 10 years.  Varicella immunization.** / Consult your caregiver.  Meningococcal immunization.** / Consult your caregiver.  Hepatitis A immunization.** / Consult your caregiver. 2 doses, 6 to 18 months apart.  Hepatitis B immunization.** / Check with your caregiver. 3 doses, usually over 6 months. ** Family history and personal history of risk and conditions may change your caregiver's recommendations. Document Released: 12/04/2001 Document Revised: 12/31/2011  Document Reviewed: 03/05/2011 [facility] Patient Information 2014 [place], [place]. Chronic Diarrhea Diarrhea is loose, watery stools. Having diarrhea means passing loose stools 3 or more times a day. Diarrhea that lasts longer than 4 weeks is considered long-lasting (chronic). Symptoms of chronic diarrhea may be continual or may come and go. People of all ages can get diarrhea. Body fluid loss (dehydration) may occur as a result of diarrhea. This means the body does not have as many fluids and salts (electrolytes) as it needs. CAUSES  There are many causes of chronic diarrhea. Causes may be different for children and adults. The various causes can be grouped into 2 categories: diarrhea caused by an infection and diarrhea not caused by an infection. Sometimes, the cause is unknown. Diarrhea caused by an infection may result from:  Parasites.  Bacteria.  Viral infections. Diarrhea not caused by an infection may result from:  Irritable bowel syndrome.  Reaction to medicines, such as antibiotics, cancer drugs, blood pressure medicines, and antacids.  Intestinal disease (Crohn's disease, ulcerative colitis, celiac disease).  Food allergies or sensitivity to additives (fructose, lactose, sugar substitutes).  Tumors.  Diabetes, thyroid disease, and other endocrine diseases.  Reduced blood flow to the intestine.  Previous surgery or radiation of the abdomen or gastrointestinal tract. Risk factors for chronic diarrhea include:  Having a severely weakened immune system, such as from HIV/AIDS.  Taking certain types of cancer-fighting drugs (chemotherapy) or other medicines.  A recent organ transplant.  Having a portion of the stomach removed.  Traveling to countries where food and water supplies are often contaminated. SYMPTOMS  In addition to frequent, loose stools, diarrhea may cause:  Cramping.  Abdominal pain.  Nausea.  Urgent need to use the bathroom, or loss of bowel  control. If dehydration occurs, problems include:  Thirst.  Less frequent urination.  Dark urine.  Dry skin.  Fatigue.  Dizziness. Infections that cause diarrhea may also cause a fever, chills, or bloody stools. DIAGNOSIS  Diagnosis may be difficult. Your caregiver must take a careful history and perform a physical exam. Tests given are based on your symptoms and history. Tests may include:  Blood or stool tests, in which 3 or more stool samples may be examined. Stool cultures may be used to test for bacteria or parasites.  X-rays.  A procedure in which a thin tube is inserted into the mouth or rectum (endoscopy). This allows the caregiver to look inside the intestine. TREATMENT   Diarrhea caused by an infection can often be treated with antibiotics.  Diarrhea not caused by an infection is more difficult to diagnose and treat. Long-term medicine use or surgery may be required. Specific treatment should be discussed with your caregiver.  If the cause cannot determined, treatment to relieve symptoms includes:  Preventing dehydration. Serious health problems can occur if you do not maintain proper fluid levels. Many oral rehydration solutions (ORS) are available at drug stores. Ask your caregiver  what product is best for you.  Not drinking beverages that contain caffeine (tea, coffee, soft drinks).  Not drinking alcohol. It causes dehydration.  Not relying on sports drinks and broths alone to maintain proper fluid levels. They should not be used to prevent severe dehydration.  Maintaining well-balanced nutrition. This may help you recover faster. PREVENTION   Drink clean or purified water.  Use proper food handling techniques.  Maintain proper hand-washing habits. HOME CARE INSTRUCTIONS   Avoid:  Caffeine.  Greasy foods.  High fiber.  If you have problems digesting lactose during or after an episode of diarrhea, you might want to try yogurt. Yogurt is often  better tolerated, because it has less lactose than milk. Yogurt with active, live bacterial cultures may even help you recover faster. SEEK MEDICAL CARE IF:  The person with diarrhea is an otherwise healthy adult and has:  Signs of dehydration.  Diarrhea for more than 2 days.  Severe pain in the abdomen or rectum.  An oral temperature above 102 F (38.9 C).  Stools containing blood or pus.  Stools that are black and tarry. SEEK IMMEDIATE MEDICAL CARE IF:  The person with diarrhea is a child, elderly person, or has a weakened immune system and has:  Signs of dehydration.  Diarrhea for more than 1 day.  Severe pain in the abdomen or rectum.  An oral temperature above 102 F (38.9 C), not controlled by medicine.  Stools containing blood or pus.  Stools that are black and tarry. Document Released: 12/29/2003 Document Revised: 12/31/2011 Document Reviewed: 02/24/2010 [facility] Patient Information 2014 [address], [place].

## 2013-03-31 NOTE — Assessment & Plan Note (Signed)
Exam done Vaccines were reviewed Labs ordered Pt ed material was given 

## 2013-03-31 NOTE — Assessment & Plan Note (Signed)
Her BP control is not optimal She will improve her lifestyle modifications an will recheck in 2 months Today I will check her lytes and renal function

## 2013-03-31 NOTE — Progress Notes (Signed)
Subjective:    Patient ID: Teresa Collins, female    DOB: [birth year], 62 y.o.   MRN: [id number]  Diarrhea  This is a recurrent problem. The current episode started more than 1 month ago. The problem occurs less than 2 times per day. The problem has been unchanged. The stool consistency is described as watery. Pertinent negatives include no abdominal pain, arthralgias, bloating, chills, coughing, fever, headaches, increased  flatus, myalgias, sweats, URI, vomiting or weight loss. The symptoms are aggravated by stress. There are no known risk factors. She has tried nothing for the symptoms. The treatment provided no relief. There is no history of bowel resection, inflammatory bowel disease, irritable bowel syndrome, malabsorption, a recent abdominal surgery or short gut syndrome.      Review of Systems  Constitutional: Negative for fever, chills, weight loss, diaphoresis, activity change, appetite change, fatigue and unexpected weight change.  HENT: Negative.   Eyes: Negative.   Respiratory: Negative.  Negative for cough, chest tightness, shortness of breath, wheezing and stridor.   Cardiovascular: Negative.  Negative for chest pain, palpitations and leg swelling.  Gastrointestinal: Positive for diarrhea. Negative for nausea, vomiting, abdominal pain, constipation, blood in stool, abdominal distention, anal bleeding, rectal pain, bloating and flatus.  Endocrine: Negative.   Genitourinary: Negative.   Musculoskeletal: Negative for myalgias, back pain, joint swelling, arthralgias and gait problem.  Skin: Negative.   Allergic/Immunologic: Negative.   Neurological: Negative for dizziness, syncope, weakness, light-headedness and headaches.  Hematological: Negative.  Negative for adenopathy. Does not bruise/bleed easily.  Psychiatric/Behavioral: Negative.        Objective:   Physical Exam  Vitals reviewed. Constitutional: She is oriented to person, place, and time. She appears well-developed  and well-nourished.  Non-toxic appearance. She does not have a sickly appearance. She does not appear ill. No distress.  HENT:  Head: Normocephalic and atraumatic.  Mouth/Throat: Oropharynx is clear and moist. No oropharyngeal exudate.  Eyes: Conjunctivae are normal. Right eye exhibits no discharge. Left eye exhibits no discharge. No scleral icterus.  Neck: Normal range of motion. Neck supple. No JVD present. No tracheal deviation present. No thyromegaly present.  Cardiovascular: Normal rate, regular rhythm, normal heart sounds and intact distal pulses.  Exam reveals no gallop and no friction rub.   No murmur heard. Pulmonary/Chest: Effort normal and breath sounds normal. No stridor. No respiratory distress. She has no wheezes. She has no rales. Chest wall is not dull to percussion. She exhibits no mass, no tenderness, no bony tenderness, no laceration, no crepitus, no edema, no deformity, no swelling and no retraction. Right breast exhibits no inverted nipple, no mass, no nipple discharge, no skin change and no tenderness. Left breast exhibits no inverted nipple, no mass, no nipple discharge, no skin change and no tenderness. Breasts are symmetrical.  Abdominal: Soft. Bowel sounds are normal. She exhibits no distension and no mass. There is no tenderness. There is no rebound and no guarding.  Musculoskeletal: Normal range of motion. She exhibits no edema and no tenderness.  Lymphadenopathy:    She has no cervical adenopathy.  Neurological: She is oriented to person, place, and time.  Skin: Skin is warm and dry. No rash noted. She is not diaphoretic. No erythema. No pallor.  Psychiatric: She has a normal mood and affect. Her behavior is normal. Judgment and thought content normal.     Lab Results  Component Value Date   WBC 5.2 09/16/2012   HGB 13.4 09/16/2012   HCT 40.2 09/16/2012   PLT  169.0 09/16/2012   GLUCOSE 108* 09/16/2012   CHOL 195 01/17/2012   TRIG 66.0 01/17/2012   HDL 50.10  01/17/2012   LDLCALC 132* 01/17/2012   ALT 13 01/17/2012   AST 13 01/17/2012   NA 140 09/16/2012   K 4.6 09/16/2012   CL 104 09/16/2012   CREATININE 1.2 09/16/2012   BUN 18 09/16/2012   CO2 32 09/16/2012   TSH 1.05 01/17/2012   HGBA1C 5.5 09/16/2012       Assessment & Plan:

## 2013-03-31 NOTE — Assessment & Plan Note (Signed)
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

## 2013-04-01 LAB — GLIADIN ANTIBODIES, SERUM: Gliadin IgG: 4 U/mL (ref <20)

## 2013-04-03 ENCOUNTER — Encounter: Payer: Self-pay | Admitting: Internal Medicine

## 2013-04-03 LAB — RETICULIN ANTIBODIES, IGA W TITER

## 2013-06-02 ENCOUNTER — Ambulatory Visit: Payer: Managed Care, Other (non HMO) | Admitting: Internal Medicine

## 2013-06-02 ENCOUNTER — Telehealth: Payer: Self-pay | Admitting: *Deleted

## 2013-06-02 NOTE — Telephone Encounter (Signed)
[facility] Fax: [phone] From: [facility] Date/ Time: 06/02/2013 8:05 AM Taken By: Yehuda Savannah, CSR Caller: [name] Facility: not collected Patient: Teresa Collins DOB: Feb 10, 1951 Phone: [phone] Reason for Call: See info below Regarding Appointment: Yes Appt Date: 06/02/2013 Appt Time: 9:30:00 AM Provider: Sanda Linger (Adults only) Reason: Details: ATTENTION:!!!!!!!!! Teresa Collins called this moring (June 02, 2013 at 7:45 AM) to [facility] her Appointment for today (June 02, 2013 at 9:30 AM) with Dr. Yetta Barre, because she had to work. Outcome: Instructed patient to call back tomorrow.

## 2013-06-23 ENCOUNTER — Other Ambulatory Visit: Payer: Self-pay | Admitting: Internal Medicine

## 2013-07-09 ENCOUNTER — Other Ambulatory Visit: Payer: Self-pay | Admitting: *Deleted

## 2013-07-09 MED ORDER — DESOXIMETASONE 0.05 % EX CREA: TOPICAL_CREAM | Freq: Two times a day (BID) | CUTANEOUS | Status: DC

## 2013-07-09 NOTE — Telephone Encounter (Signed)
Refill done for topicort

## 2013-08-13 ENCOUNTER — Ambulatory Visit (INDEPENDENT_AMBULATORY_CARE_PROVIDER_SITE_OTHER): Payer: Managed Care, Other (non HMO) | Admitting: Internal Medicine

## 2013-08-13 ENCOUNTER — Encounter: Payer: Self-pay | Admitting: Internal Medicine

## 2013-08-13 VITALS — BP 122/82 | HR 59 | Temp 97.8°F | Resp 16 | Ht 61.0 in | Wt 204.5 lb

## 2013-08-13 DIAGNOSIS — Z23 Encounter for immunization: Secondary | ICD-10-CM

## 2013-08-13 DIAGNOSIS — I10 Essential (primary) hypertension: Secondary | ICD-10-CM

## 2013-08-13 DIAGNOSIS — E785 Hyperlipidemia, unspecified: Secondary | ICD-10-CM | POA: Insufficient documentation

## 2013-08-13 MED ORDER — PITAVASTATIN CALCIUM 2 MG PO TABS: 1.0000 | ORAL_TABLET | Freq: Every day | ORAL | Status: DC

## 2013-08-13 NOTE — Assessment & Plan Note (Signed)
Her BP is well controlled 

## 2013-08-13 NOTE — Progress Notes (Signed)
  Subjective:    Patient ID: Teresa Collins, female    DOB: 31-May-1951, 62 y.o.   MRN: [id number]  Hyperlipidemia This is a chronic problem. The current episode started more than 1 year ago. The problem is uncontrolled. Recent lipid tests were reviewed and are variable. Exacerbating diseases include obesity. She has no history of chronic renal disease, diabetes, hypothyroidism, liver disease or nephrotic syndrome. Factors aggravating her hyperlipidemia include fatty foods. Pertinent negatives include no chest pain, focal sensory loss, focal weakness, leg pain, myalgias or shortness of breath. She is currently on no antihyperlipidemic treatment. The current treatment provides no improvement of lipids.      Review of Systems  Constitutional: Negative.  Negative for fever, chills, diaphoresis, appetite change and fatigue.  HENT: Negative.   Eyes: Negative.   Respiratory: Negative.  Negative for cough, chest tightness, shortness of breath and wheezing.   Cardiovascular: Negative.  Negative for chest pain, palpitations and leg swelling.  Gastrointestinal: Negative.  Negative for abdominal pain.  Endocrine: Negative.   Genitourinary: Negative.   Musculoskeletal: Negative.  Negative for arthralgias, back pain, myalgias and neck stiffness.  Skin: Negative.   Allergic/Immunologic: Negative.   Neurological: Negative.  Negative for dizziness, tremors, focal weakness, speech difficulty, weakness and light-headedness.  Hematological: Negative.  Negative for adenopathy. Does not bruise/bleed easily.  Psychiatric/Behavioral: Negative.        Objective:   Physical Exam  Vitals reviewed. Constitutional: She is oriented to person, place, and time. She appears well-developed and well-nourished. No distress.  HENT:  Head: Normocephalic and atraumatic.  Mouth/Throat: Oropharynx is clear and moist. No oropharyngeal exudate.  Eyes: Conjunctivae are normal. Right eye exhibits no discharge. Left eye  exhibits no discharge. No scleral icterus.  Neck: Normal range of motion. Neck supple. No JVD present. No tracheal deviation present. No thyromegaly present.  Cardiovascular: Normal rate, regular rhythm, normal heart sounds and intact distal pulses.  Exam reveals no gallop and no friction rub.   No murmur heard. Pulmonary/Chest: Effort normal and breath sounds normal. No stridor. No respiratory distress. She has no wheezes. She has no rales. She exhibits no tenderness.  Abdominal: Soft. Bowel sounds are normal. She exhibits no distension and no mass. There is no tenderness. There is no rebound and no guarding.  Musculoskeletal: Normal range of motion. She exhibits no edema and no tenderness.  Lymphadenopathy:    She has no cervical adenopathy.  Neurological: She is oriented to person, place, and time.  Skin: Skin is warm and dry. No rash noted. She is not diaphoretic. No erythema. No pallor.  Psychiatric: She has a normal mood and affect. Her behavior is normal. Judgment and thought content normal.     Lab Results  Component Value Date   WBC 5.4 03/31/2013   HGB 14.1 03/31/2013   HCT 41.3 03/31/2013   PLT 163.0 03/31/2013   GLUCOSE 104* 03/31/2013   CHOL 238* 03/31/2013   TRIG 107.0 03/31/2013   HDL 45.20 03/31/2013   LDLDIRECT 166.8 03/31/2013   LDLCALC 132* 01/17/2012   ALT 15 03/31/2013   AST 18 03/31/2013   NA 140 03/31/2013   K 4.1 03/31/2013   CL 104 03/31/2013   CREATININE 1.0 03/31/2013   BUN 18 03/31/2013   CO2 28 03/31/2013   TSH 0.89 03/31/2013   HGBA1C 5.5 09/16/2012       Assessment & Plan:

## 2013-08-13 NOTE — Patient Instructions (Signed)

## 2013-08-13 NOTE — Assessment & Plan Note (Signed)
She has several risk factors for CAD and CVA so I have asked her to start livalo for risk reduction

## 2013-10-08 ENCOUNTER — Other Ambulatory Visit: Payer: Self-pay | Admitting: Internal Medicine

## 2013-10-22 DIAGNOSIS — C649 Malignant neoplasm of unspecified kidney, except renal pelvis: Secondary | ICD-10-CM

## 2013-10-22 HISTORY — DX: Malignant neoplasm of unspecified kidney, except renal pelvis: C64.9

## 2013-10-31 ENCOUNTER — Encounter (HOSPITAL_COMMUNITY): Payer: Self-pay | Admitting: Emergency Medicine

## 2013-10-31 ENCOUNTER — Emergency Department (INDEPENDENT_AMBULATORY_CARE_PROVIDER_SITE_OTHER): Payer: Managed Care, Other (non HMO)

## 2013-10-31 ENCOUNTER — Emergency Department (HOSPITAL_COMMUNITY)
Admission: EM | Admit: 2013-10-31 | Discharge: 2013-10-31 | Disposition: A | Discharge status: Home / Self Care | Payer: Managed Care, Other (non HMO) | Source: Home / Self Care | Attending: Emergency Medicine | Admitting: Emergency Medicine

## 2013-10-31 DIAGNOSIS — J40 Bronchitis, not specified as acute or chronic: Secondary | ICD-10-CM

## 2013-10-31 LAB — CBC WITH DIFFERENTIAL/PLATELET
BASOS ABS: 0 10*3/uL (ref 0.0–0.1)
BASOS PCT: 1 % (ref 0–1)
Eosinophils Absolute: 0.1 10*3/uL (ref 0.0–0.7)
Eosinophils Relative: 3 % (ref 0–5)
HEMATOCRIT: 42.4 % (ref 36.0–46.0)
Hemoglobin: 15.1 g/dL — ABNORMAL HIGH (ref 12.0–15.0)
LYMPHS PCT: 37 % (ref 12–46)
Lymphs Abs: 1.3 10*3/uL (ref 0.7–4.0)
MCH: 30.8 pg (ref 26.0–34.0)
MCHC: 35.6 g/dL (ref 30.0–36.0)
MCV: 86.4 fL (ref 78.0–100.0)
MONO ABS: 0.5 10*3/uL (ref 0.1–1.0)
Monocytes Relative: 15 % — ABNORMAL HIGH (ref 3–12)
Neutro Abs: 1.5 10*3/uL — ABNORMAL LOW (ref 1.7–7.7)
Neutrophils Relative %: 45 % (ref 43–77)
PLATELETS: 131 10*3/uL — AB (ref 150–400)
RBC: 4.91 MIL/uL (ref 3.87–5.11)
RDW: 13.4 % (ref 11.5–15.5)
WBC: 3.4 10*3/uL — AB (ref 4.0–10.5)

## 2013-10-31 LAB — POCT URINALYSIS DIP (DEVICE)
Bilirubin Urine: NEGATIVE
Glucose, UA: NEGATIVE mg/dL
Ketones, ur: NEGATIVE mg/dL
NITRITE: NEGATIVE
PROTEIN: 30 mg/dL — AB
Specific Gravity, Urine: 1.025 (ref 1.005–1.030)
UROBILINOGEN UA: 0.2 mg/dL (ref 0.0–1.0)
pH: 5.5 (ref 5.0–8.0)

## 2013-10-31 LAB — POCT I-STAT, CHEM 8
BUN: 19 mg/dL (ref 6–23)
CALCIUM ION: 1.27 mmol/L (ref 1.13–1.30)
CHLORIDE: 104 meq/L (ref 96–112)
Creatinine, Ser: 1 mg/dL (ref 0.50–1.10)
GLUCOSE: 130 mg/dL — AB (ref 70–99)
HCT: 47 % — ABNORMAL HIGH (ref 36.0–46.0)
Hemoglobin: 16 g/dL — ABNORMAL HIGH (ref 12.0–15.0)
Potassium: 3.9 mEq/L (ref 3.7–5.3)
Sodium: 141 mEq/L (ref 137–147)
TCO2: 24 mmol/L (ref 0–100)

## 2013-10-31 LAB — CARBOXYHEMOGLOBIN
CARBOXYHEMOGLOBIN: 1.3 % (ref 0.5–1.5)
METHEMOGLOBIN: 0.6 % (ref 0.0–1.5)
O2 Saturation: 75.6 %
TOTAL HEMOGLOBIN: 15.3 g/dL (ref 12.0–16.0)

## 2013-10-31 MED ORDER — ALBUTEROL SULFATE (2.5 MG/3ML) 0.083% IN NEBU
INHALATION_SOLUTION | RESPIRATORY_TRACT | Status: AC
  Filled 2013-10-31: qty 6

## 2013-10-31 MED ORDER — IPRATROPIUM BROMIDE 0.02 % IN SOLN
0.5000 mg | Freq: Once | RESPIRATORY_TRACT | Status: AC
  Administered 2013-10-31: 0.5 mg via RESPIRATORY_TRACT

## 2013-10-31 MED ORDER — IPRATROPIUM BROMIDE 0.02 % IN SOLN
RESPIRATORY_TRACT | Status: AC
  Filled 2013-10-31: qty 2.5

## 2013-10-31 MED ORDER — ALBUTEROL SULFATE (5 MG/ML) 0.5% IN NEBU
5.0000 mg | INHALATION_SOLUTION | Freq: Once | RESPIRATORY_TRACT | Status: AC
  Administered 2013-10-31: 5 mg via RESPIRATORY_TRACT

## 2013-10-31 MED ORDER — AZITHROMYCIN 250 MG PO TABS: ORAL_TABLET | ORAL | Status: DC

## 2013-10-31 MED ORDER — AEROCHAMBER PLUS FLO-VU LARGE MISC: 1.0000 | Freq: Once | Status: DC

## 2013-10-31 MED ORDER — METHYLPREDNISOLONE (PAK) 4 MG PO TABS: ORAL_TABLET | ORAL | Status: DC

## 2013-10-31 MED ORDER — ALBUTEROL SULFATE HFA 108 (90 BASE) MCG/ACT IN AERS: 1.0000 | INHALATION_SPRAY | Freq: Four times a day (QID) | RESPIRATORY_TRACT | Status: DC | PRN

## 2013-10-31 NOTE — ED Notes (Signed)
C/o cold sx since Tuesday, has been taking Coricidin OTC

## 2013-10-31 NOTE — ED Provider Notes (Signed)
Medical screening examination/treatment/procedure(s) were performed by non-physician practitioner and as supervising physician I was immediately available for consultation/collaboration.  Philipp Deputy, M.D.  Harden Mo, MD 10/31/13 [phone]

## 2013-10-31 NOTE — ED Notes (Signed)
Spoke with respiratory therapy about COOX test.  Syringe obtained from RT and blood drawn.  Blood sent back to RT for testing

## 2013-10-31 NOTE — ED Provider Notes (Signed)
CSN: [id number]     Arrival date & time 10/31/13  [id number] History   First MD Initiated Contact with Patient 10/31/13 0957     Chief Complaint  Patient presents with  . URI   (Consider location/radiation/quality/duration/timing/severity/associated sxs/prior Treatment) HPI Comments: 63 year old female presents complaining of being sick since Tuesday 5 days ago. Her symptoms include dizziness, cough, sore throat, shortness of breath, fatigue, diarrhea, decreased appetite, nausea, and a sensation of chest pressure yesterday that has now resolved. She has not been able to take anything by mouth except for very small amounts of water which she all week because she says she was just too sick to eat or drink. This started on Tuesday with acute onset of body aches and fatigue with dizziness and a mild cough and shortness of breath. She also started with decreased appetite. The symptoms persisted, and 2 days ago she began to have the diarrhea. She is not urinating a normal amount. She believes this is because she is dehydrated.  Patient is a 63 y.o. female presenting with URI.  URI Presenting symptoms: congestion, cough, fatigue and sore throat   Presenting symptoms: no fever   Associated symptoms: no arthralgias and no myalgias     Past Medical History  Diagnosis Date  . Hypertension   . GERD (gastroesophageal reflux disease)   . Eczema   . Hx of colonic polyps   . Vertigo     Dx 10/16/11   Past Surgical History  Procedure Laterality Date  . Cesarean section    . Uterine fibroid surgery     Family History  Problem Relation Age of Onset  . Hypertension Other   . Cancer Other     Uterine Cancer   History  Substance Use Topics  . Smoking status: Never Smoker   . Smokeless tobacco: Never Used  . Alcohol Use: No   OB History   Grav Para Term Preterm Abortions TAB SAB Ect Mult Living                 Review of Systems  Constitutional: Positive for chills, activity change, appetite  change and fatigue. Negative for fever.  HENT: Positive for congestion, sinus pressure and sore throat.   Eyes: Negative for visual disturbance.  Respiratory: Positive for cough and shortness of breath.   Cardiovascular: Positive for chest pain. Negative for palpitations and leg swelling.  Gastrointestinal: Positive for nausea, abdominal pain and diarrhea. Negative for vomiting and blood in stool.  Endocrine: Negative for polydipsia and polyuria.  Genitourinary: Positive for decreased urine volume. Negative for dysuria, urgency and frequency.  Musculoskeletal: Negative for arthralgias and myalgias.  Skin: Negative for rash.  Neurological: Negative for dizziness, weakness and light-headedness.    Allergies  Lipitor; Codeine sulfate; and Latex  Home Medications   Current Outpatient Rx  Name  Route  Sig  Dispense  Refill  . Cholecalciferol (VITAMIN D) 2000 UNITS CAPS   Oral   Take by mouth.           . cyclobenzaprine (FLEXERIL) 10 MG tablet   Oral   Take 1 tablet (10 mg total) by mouth 3 (three) times daily as needed for muscle spasms.   30 tablet   0   . desoximetasone (TOPICORT) 0.05 % cream   Topical   Apply topically 2 (two) times daily.   30 g   0   . lisinopril-hydrochlorothiazide (PRINZIDE,ZESTORETIC) 10-12.5 MG per tablet      TAKE ONE TABLET BY MOUTH ONCE  DAILY   90 tablet   1   . Multiple Vitamins-Minerals (CENTRUM ULTRA WOMENS) TABS   Oral   Take 1 tablet by mouth daily.           . Pitavastatin Calcium (LIVALO) 2 MG TABS   Oral   Take 1 tablet (2 mg total) by mouth daily.   90 tablet   3   . VITAMIN E PO   Oral   Take by mouth.           Marland Kitchen albuterol (PROVENTIL HFA;VENTOLIN HFA) 108 (90 BASE) MCG/ACT inhaler   Inhalation   Inhale 1-2 puffs into the lungs every 6 (six) hours as needed for wheezing or shortness of breath.   1 Inhaler   0   . azithromycin (ZITHROMAX Z-PAK) 250 MG tablet      Use as directed   6 each   0   .  methylPREDNIsolone (MEDROL DOSPACK) 4 MG tablet      follow package directions   21 tablet   0   . Spacer/Aero-Holding Chambers (AEROCHAMBER PLUS FLO-VU LARGE) MISC   Other   1 each by Other route once.   1 each   0    BP 187/102  Pulse 82  Temp(Src) 98.3 F (36.8 C) (Oral)  Resp 18  SpO2 100% Physical Exam  Nursing note and vitals reviewed. Constitutional: She is oriented to person, place, and time. Vital signs are normal. She appears well-developed and well-nourished. She appears ill. No distress.  HENT:  Head: Normocephalic and atraumatic.  Left Ear: External ear normal.  Nose: Nose normal.  Mouth/Throat: Oropharynx is clear and moist. No oropharyngeal exudate.  Eyes: Conjunctivae are normal. Right eye exhibits no discharge. Left eye exhibits no discharge. No scleral icterus.  Neck: Normal range of motion. Neck supple. No JVD present. No tracheal deviation present.  Cardiovascular: Normal rate, regular rhythm and normal heart sounds.  Exam reveals no gallop and no friction rub.   No murmur heard. Pulmonary/Chest: Breath sounds normal. Tachypnea noted. No respiratory distress. She has no wheezes. She has no rhonchi. She has no rales.  Mild dyspnea  Abdominal: Soft. There is no tenderness. There is no rebound and no guarding.  Lymphadenopathy:    She has no cervical adenopathy.  Neurological: She is alert and oriented to person, place, and time. She has normal strength. Coordination normal.  Skin: Skin is warm and dry. No rash noted. She is not diaphoretic.  Psychiatric: She has a normal mood and affect. Judgment normal.    ED Course  Procedures (including critical care time) Labs Review Labs Reviewed  CBC WITH DIFFERENTIAL - Abnormal; Notable for the following:    WBC 3.4 (*)    Hemoglobin 15.1 (*)    Platelets 131 (*)    Neutro Abs 1.5 (*)    Monocytes Relative 15 (*)    All other components within normal limits  POCT I-STAT, CHEM 8 - Abnormal; Notable for the  following:    Glucose, Bld 130 (*)    Hemoglobin 16.0 (*)    HCT 47.0 (*)    All other components within normal limits  POCT URINALYSIS DIP (DEVICE) - Abnormal; Notable for the following:    Hgb urine dipstick TRACE (*)    Protein, ur 30 (*)    Leukocytes, UA TRACE (*)    All other components within normal limits  CARBOXYHEMOGLOBIN   Imaging Review Dg Chest 2 View  10/31/2013   CLINICAL DATA:  Cough  EXAM: CHEST  2 VIEW  COMPARISON:  01/17/2012  FINDINGS: The heart size and mediastinal contours are within normal limits. Both lungs are clear. The visualized skeletal structures are unremarkable.  IMPRESSION: No active cardiopulmonary disease.   Electronically Signed   By: Daryll Brod M.D.   On: 10/31/2013 11:06    EKG is unchanged, non-ischemic  Chest x-rays negative I-STAT is normal CBC normal  Carboxyhemoglobin normal   MDM   1. Bronchitis    All labs normal.  Significant symptomatic improvement with neb.  F/u PRN if worsening or not improving   Meds ordered this encounter  Medications  . ipratropium (ATROVENT) nebulizer solution 0.5 mg    Sig:    And  . albuterol (PROVENTIL) (5 MG/ML) 0.5% nebulizer solution 5 mg    Sig:   . albuterol (PROVENTIL HFA;VENTOLIN HFA) 108 (90 BASE) MCG/ACT inhaler    Sig: Inhale 1-2 puffs into the lungs every 6 (six) hours as needed for wheezing or shortness of breath.    Dispense:  1 Inhaler    Refill:  0    Order Specific Question:  Supervising Provider    Answer:  Ihor Gully D [id number]  . Spacer/Aero-Holding Chambers (AEROCHAMBER PLUS FLO-VU LARGE) MISC    Sig: 1 each by Other route once.    Dispense:  1 each    Refill:  0    Order Specific Question:  Supervising Provider    Answer:  Billy Fischer [phone]  . methylPREDNIsolone (MEDROL DOSPACK) 4 MG tablet    Sig: follow package directions    Dispense:  21 tablet    Refill:  0    Order Specific Question:  Supervising Provider    Answer:  Billy Fischer [phone]  . azithromycin  (ZITHROMAX Z-PAK) 250 MG tablet    Sig: Use as directed    Dispense:  6 each    Refill:  0    Order Specific Question:  Supervising Provider    Answer:  Ihor Gully D [[id number]]     Liam Graham, PA-C 10/31/13 [phone]

## 2013-10-31 NOTE — Discharge Instructions (Signed)

## 2013-11-09 ENCOUNTER — Encounter: Payer: Self-pay | Admitting: Internal Medicine

## 2013-11-18 ENCOUNTER — Other Ambulatory Visit: Payer: Self-pay | Admitting: Internal Medicine

## 2013-11-18 ENCOUNTER — Encounter: Payer: Self-pay | Admitting: Internal Medicine

## 2013-11-18 DIAGNOSIS — Z1231 Encounter for screening mammogram for malignant neoplasm of breast: Secondary | ICD-10-CM

## 2013-11-19 ENCOUNTER — Other Ambulatory Visit: Payer: Self-pay | Admitting: Internal Medicine

## 2013-11-24 ENCOUNTER — Ambulatory Visit (HOSPITAL_COMMUNITY): Payer: Managed Care, Other (non HMO) | Attending: Internal Medicine

## 2013-11-26 ENCOUNTER — Telehealth: Payer: Self-pay | Admitting: *Deleted

## 2013-11-26 MED ORDER — DESOXIMETASONE 0.05 % EX CREA: TOPICAL_CREAM | CUTANEOUS | Status: DC

## 2013-11-26 NOTE — Telephone Encounter (Signed)
Patient phoned requesting refill for eczema cream.  Refilled per protocol.  Patient notified.

## 2013-12-22 ENCOUNTER — Ambulatory Visit (AMBULATORY_SURGERY_CENTER): Payer: Self-pay

## 2013-12-22 VITALS — Ht 61.0 in | Wt 196.2 lb

## 2013-12-22 DIAGNOSIS — Z86010 Personal history of colonic polyps: Secondary | ICD-10-CM

## 2013-12-22 MED ORDER — MOVIPREP 100 G PO SOLR: ORAL | Status: DC

## 2014-01-05 ENCOUNTER — Ambulatory Visit (AMBULATORY_SURGERY_CENTER): Payer: Managed Care, Other (non HMO) | Admitting: Internal Medicine

## 2014-01-05 ENCOUNTER — Encounter: Payer: Self-pay | Admitting: Internal Medicine

## 2014-01-05 VITALS — BP 167/75 | HR 53 | Temp 96.7°F | Resp 19 | Ht 61.0 in | Wt 196.0 lb

## 2014-01-05 DIAGNOSIS — Z86010 Personal history of colonic polyps: Secondary | ICD-10-CM

## 2014-01-05 DIAGNOSIS — D126 Benign neoplasm of colon, unspecified: Secondary | ICD-10-CM

## 2014-01-05 MED ORDER — SODIUM CHLORIDE 0.9 % IV SOLN: 500.0000 mL | INTRAVENOUS | Status: DC

## 2014-01-05 NOTE — Progress Notes (Signed)
Lidocaine-40mg IV prior to Propofol InductionPropofol given over incremental dosages 

## 2014-01-05 NOTE — Patient Instructions (Signed)
YOU HAD AN ENDOSCOPIC PROCEDURE TODAY AT THE [facility]: Refer to the procedure report that was given to you for any specific questions about what was found during the examination.  If the procedure report does not answer your questions, please call your gastroenterologist to clarify.  If you requested that your care partner not be given the details of your procedure findings, then the procedure report has been included in a sealed envelope for you to review at your convenience later.  YOU SHOULD EXPECT: Some feelings of bloating in the abdomen. Passage of more gas than usual.  Walking can help get rid of the air that was put into your GI tract during the procedure and reduce the bloating. If you had a lower endoscopy (such as a colonoscopy or flexible sigmoidoscopy) you may notice spotting of blood in your stool or on the toilet paper. If you underwent a bowel prep for your procedure, then you may not have a normal bowel movement for a few days.  DIET: Your first meal following the procedure should be a light meal and then it is ok to progress to your normal diet.  A half-sandwich or bowl of soup is an example of a good first meal.  Heavy or fried foods are harder to digest and may make you feel nauseous or bloated.  Likewise meals heavy in dairy and vegetables can cause extra gas to form and this can also increase the bloating.  Drink plenty of fluids but you should avoid alcoholic beverages for 24 hours.  ACTIVITY: Your care partner should take you home directly after the procedure.  You should plan to take it easy, moving slowly for the rest of the day.  You can resume normal activity the day after the procedure however you should NOT DRIVE or use heavy machinery for 24 hours (because of the sedation medicines used during the test).    SYMPTOMS TO REPORT IMMEDIATELY: A gastroenterologist can be reached at any hour.  During normal business hours, 8:30 AM to 5:00 PM Monday through Friday,  call [phone].  After hours and on weekends, please call the GI answering service at [phone] who will take a message and have the physician on call contact you.   Following lower endoscopy (colonoscopy or flexible sigmoidoscopy):  Excessive amounts of blood in the stool  Significant tenderness or worsening of abdominal pains  Swelling of the abdomen that is new, acute  Fever of 100F or higher  FOLLOW UP: If any biopsies were taken you will be contacted by phone or by letter within the next 1-3 weeks.  Call your gastroenterologist if you have not heard about the biopsies in 3 weeks.  Our staff will call the home number listed on your records the next business day following your procedure to check on you and address any questions or concerns that you may have at that time regarding the information given to you following your procedure. This is a courtesy call and so if there is no answer at the home number and we have not heard from you through the emergency physician on call, we will assume that you have returned to your regular daily activities without incident.  SIGNATURES/CONFIDENTIALITY: You and/or your care partner have signed paperwork which will be entered into your electronic medical record.  These signatures attest to the fact that that the information above on your After Visit Summary has been reviewed and is understood.  Full responsibility of the confidentiality of this   discharge information lies with you and/or your care-partner.  Recommendations Repeat colonoscopy in 3 years.  

## 2014-01-05 NOTE — Op Note (Signed)
[place]  [address] [place], [address]   COLONOSCOPY PROCEDURE REPORT  PATIENT: Teresa Collins, Teresa Collins  MR#: [id number] BIRTHDATE: 1950-11-30 , 62  yrs. old GENDER: Female ENDOSCOPIST: Eustace Quail, MD REFERRED [id number] Program Recall PROCEDURE DATE:  01/05/2014 PROCEDURE:   Colonoscopy with snare polypectomy x 3 First Screening Colonoscopy - Avg.  risk and is 50 yrs.  old or older - No.  Prior Negative Screening - Now for repeat screening. N/A  History of Adenoma - Now for follow-up colonoscopy & has been > or = to 3 yrs.  Yes hx of adenoma.  Has been 3 or more years since last colonoscopy.  Polyps Removed Today? Yes. ASA CLASS:   Class II INDICATIONS:Patient's personal history of adenomatous colon polyps. Index 11-2010 w/ multiple adenomas MEDICATIONS: MAC sedation, administered by CRNA and propofol (Diprivan) 250mg  IV  DESCRIPTION OF PROCEDURE:   After the risks benefits and alternatives of the procedure were thoroughly explained, informed consent was obtained.  A digital rectal exam revealed no abnormalities of the rectum.   The LB [id number]  endoscope was introduced through the anus and advanced to the cecum, which was identified by both the appendix and ileocecal valve. No adverse events experienced.   The quality of the prep was excellent, using MoviPrep  The instrument was then slowly withdrawn as the colon was fully examined.  COLON FINDINGS: Three polyps were found at the cecum (10 mm) and in the ascending colon (59mm, 1mm). A polypectomy was performed with a cold snare.  The resection was complete and the polyp tissue was completely retrieved.   Moderate diverticulosis was noted The finding was in the left colon.   The colon mucosa was otherwise normal.  Retroflexed views revealed no abnormalities. The time to cecum=3 minutes 03 secs.  Withdrawal time=13 minutes 07 secs.  The scope was withdrawn and the procedure  completed. COMPLICATIONS: There were no complications.  ENDOSCOPIC IMPRESSION: 1.   Three polyps were found at the cecum and in the ascending colon; polypectomy was performed with a cold snare 2.   Moderate diverticulosis was noted in the left colon 3.   The colon mucosa was otherwise normal  RECOMMENDATIONS: 1. Repeat Colonoscopy in 3 years.   eSigned:  Eustace Quail, MD 01/05/2014 8:49 AM   cc: Janith Lima, MD and The Patient

## 2014-01-05 NOTE — Progress Notes (Signed)
Called to room to assist during endoscopic procedure.  Patient ID and intended procedure confirmed with present staff. Received instructions for my participation in the procedure from the performing physician.  

## 2014-01-06 ENCOUNTER — Telehealth: Payer: Self-pay | Admitting: *Deleted

## 2014-01-06 NOTE — Telephone Encounter (Signed)
  Follow up Call-  Call back number 01/05/2014  Post procedure Call Back phone  # [phone] ask for patient  Permission to leave phone message Yes     Patient questions:  Do you have a fever, pain , or abdominal swelling? no Pain Score  0 *  Have you tolerated food without any problems? yes  Have you been able to return to your normal activities? yes  Do you have any questions about your discharge instructions: Diet   no Medications  no Follow up visit  no  Do you have questions or concerns about your Care? no  Actions: * If pain score is 4 or above: No action needed, pain <4.

## 2014-01-11 ENCOUNTER — Encounter: Payer: Self-pay | Admitting: Internal Medicine

## 2014-01-12 LAB — HM COLONOSCOPY: HM Colonoscopy: 3

## 2014-02-12 ENCOUNTER — Other Ambulatory Visit (INDEPENDENT_AMBULATORY_CARE_PROVIDER_SITE_OTHER): Payer: Managed Care, Other (non HMO)

## 2014-02-12 ENCOUNTER — Encounter: Payer: Self-pay | Admitting: Internal Medicine

## 2014-02-12 ENCOUNTER — Ambulatory Visit (INDEPENDENT_AMBULATORY_CARE_PROVIDER_SITE_OTHER): Payer: Managed Care, Other (non HMO) | Admitting: Internal Medicine

## 2014-02-12 VITALS — BP 142/80 | HR 63 | Temp 98.1°F | Resp 16 | Ht 61.0 in | Wt 186.0 lb

## 2014-02-12 DIAGNOSIS — M1990 Unspecified osteoarthritis, unspecified site: Secondary | ICD-10-CM

## 2014-02-12 DIAGNOSIS — M25559 Pain in unspecified hip: Secondary | ICD-10-CM

## 2014-02-12 DIAGNOSIS — R7309 Other abnormal glucose: Secondary | ICD-10-CM

## 2014-02-12 DIAGNOSIS — M25552 Pain in left hip: Secondary | ICD-10-CM

## 2014-02-12 DIAGNOSIS — I10 Essential (primary) hypertension: Secondary | ICD-10-CM

## 2014-02-12 DIAGNOSIS — E785 Hyperlipidemia, unspecified: Secondary | ICD-10-CM

## 2014-02-12 LAB — CBC WITH DIFFERENTIAL/PLATELET
BASOS ABS: 0 10*3/uL (ref 0.0–0.1)
Basophils Relative: 0.6 % (ref 0.0–3.0)
Eosinophils Absolute: 0.1 10*3/uL (ref 0.0–0.7)
Eosinophils Relative: 2.3 % (ref 0.0–5.0)
HCT: 41.4 % (ref 36.0–46.0)
Hemoglobin: 14.1 g/dL (ref 12.0–15.0)
LYMPHS ABS: 1.5 10*3/uL (ref 0.7–4.0)
Lymphocytes Relative: 29.3 % (ref 12.0–46.0)
MCHC: 33.9 g/dL (ref 30.0–36.0)
MCV: 89.5 fl (ref 78.0–100.0)
MONO ABS: 0.7 10*3/uL (ref 0.1–1.0)
MONOS PCT: 13.2 % — AB (ref 3.0–12.0)
Neutro Abs: 2.9 10*3/uL (ref 1.4–7.7)
Neutrophils Relative %: 54.6 % (ref 43.0–77.0)
PLATELETS: 164 10*3/uL (ref 150.0–400.0)
RBC: 4.63 Mil/uL (ref 3.87–5.11)
RDW: 14.2 % (ref 11.5–14.6)
WBC: 5.3 10*3/uL (ref 4.5–10.5)

## 2014-02-12 LAB — BASIC METABOLIC PANEL
BUN: 16 mg/dL (ref 6–23)
CALCIUM: 10.7 mg/dL — AB (ref 8.4–10.5)
CO2: 29 mEq/L (ref 19–32)
Chloride: 104 mEq/L (ref 96–112)
Creatinine, Ser: 0.9 mg/dL (ref 0.4–1.2)
GFR: 78.67 mL/min (ref >60.00)
Glucose, Bld: 99 mg/dL (ref 70–99)
Potassium: 5 mEq/L (ref 3.5–5.1)
SODIUM: 140 meq/L (ref 135–145)

## 2014-02-12 LAB — HEMOGLOBIN A1C: HEMOGLOBIN A1C: 5.5 % (ref 4.6–6.5)

## 2014-02-12 MED ORDER — PITAVASTATIN CALCIUM 2 MG PO TABS: 1.0000 | ORAL_TABLET | Freq: Every day | ORAL | Status: DC

## 2014-02-12 NOTE — Progress Notes (Signed)
Pre visit review using our clinic review tool, if applicable. No additional management support is needed unless otherwise documented below in the visit note. 

## 2014-02-12 NOTE — Progress Notes (Signed)
   Subjective:    Patient ID: Teresa Collins, female    DOB: [birth year], 63 y.o.   MRN: [id number]  Hypertension This is a chronic problem. The current episode started more than 1 year ago. The problem is unchanged. The problem is controlled. Pertinent negatives include no anxiety, blurred vision, chest pain, headaches, malaise/fatigue, neck pain, orthopnea, palpitations, peripheral edema, PND, shortness of breath or sweats. There are no associated agents to hypertension. Past treatments include ACE inhibitors and diuretics. The current treatment provides moderate improvement. Compliance problems include exercise and diet.       Review of Systems  Constitutional: Negative.  Negative for fever, chills, malaise/fatigue, diaphoresis, appetite change and fatigue.  HENT: Negative.   Eyes: Negative.  Negative for blurred vision.  Respiratory: Negative.  Negative for cough, choking, shortness of breath and stridor.   Cardiovascular: Negative.  Negative for chest pain, palpitations, orthopnea, leg swelling and PND.  Gastrointestinal: Negative.  Negative for nausea, vomiting, abdominal pain, diarrhea, constipation and blood in stool.  Endocrine: Negative.   Genitourinary: Negative.   Musculoskeletal: Negative.  Negative for neck pain.  Skin: Negative.   Allergic/Immunologic: Negative.   Neurological: Negative.  Negative for dizziness and headaches.  Hematological: Negative.  Negative for adenopathy.  Psychiatric/Behavioral: Negative.        Objective:   Physical Exam  Vitals reviewed. Constitutional: She is oriented to person, place, and time. She appears well-developed and well-nourished. No distress.  HENT:  Head: Normocephalic and atraumatic.  Mouth/Throat: Oropharynx is clear and moist.  Eyes: Conjunctivae are normal. Right eye exhibits no discharge. Left eye exhibits no discharge. No scleral icterus.  Neck: Normal range of motion. Neck supple. No JVD present. No tracheal deviation  present. No thyromegaly present.  Cardiovascular: Normal rate, regular rhythm, normal heart sounds and intact distal pulses.  Exam reveals no gallop and no friction rub.   No murmur heard. Pulmonary/Chest: Effort normal and breath sounds normal. No stridor. No respiratory distress. She has no wheezes. She has no rales. She exhibits no tenderness.  Abdominal: Soft. Bowel sounds are normal. She exhibits no distension and no mass. There is no tenderness. There is no rebound and no guarding.  Musculoskeletal: Normal range of motion. She exhibits no edema and no tenderness.  Lymphadenopathy:    She has no cervical adenopathy.  Neurological: She is oriented to person, place, and time.  Skin: Skin is warm and dry. No rash noted. She is not diaphoretic. No erythema. No pallor.  Psychiatric: She has a normal mood and affect. Her behavior is normal. Judgment and thought content normal.     Lab Results  Component Value Date   WBC 3.4* 10/31/2013   HGB 16.0* 10/31/2013   HCT 47.0* 10/31/2013   PLT 131* 10/31/2013   GLUCOSE 130* 10/31/2013   CHOL 238* 03/31/2013   TRIG 107.0 03/31/2013   HDL 45.20 03/31/2013   LDLDIRECT 166.8 03/31/2013   LDLCALC 132* 01/17/2012   ALT 15 03/31/2013   AST 18 03/31/2013   NA 141 10/31/2013   K 3.9 10/31/2013   CL 104 10/31/2013   CREATININE 1.00 10/31/2013   BUN 19 10/31/2013   CO2 28 03/31/2013   TSH 0.89 03/31/2013   HGBA1C 5.5 09/16/2012       Assessment & Plan:

## 2014-02-12 NOTE — Patient Instructions (Signed)

## 2014-02-14 ENCOUNTER — Encounter: Payer: Self-pay | Admitting: Internal Medicine

## 2014-02-14 NOTE — Assessment & Plan Note (Signed)
Ortho referral  

## 2014-02-14 NOTE — Assessment & Plan Note (Signed)
Her EKG is normal - no LVH Her BP is well controlled

## 2014-02-14 NOTE — Assessment & Plan Note (Signed)
She has not been taking livalo so I have asked her to restart it

## 2014-04-30 ENCOUNTER — Other Ambulatory Visit: Payer: Self-pay | Admitting: Internal Medicine

## 2014-05-05 ENCOUNTER — Emergency Department (HOSPITAL_COMMUNITY)
Admission: EM | Admit: 2014-05-05 | Discharge: 2014-05-05 | Disposition: A | Discharge status: Home / Self Care | Payer: Managed Care, Other (non HMO) | Attending: Emergency Medicine | Admitting: Emergency Medicine

## 2014-05-05 ENCOUNTER — Other Ambulatory Visit: Payer: Self-pay

## 2014-05-05 ENCOUNTER — Encounter (HOSPITAL_COMMUNITY): Payer: Self-pay | Admitting: Emergency Medicine

## 2014-05-05 ENCOUNTER — Other Ambulatory Visit (HOSPITAL_COMMUNITY): Payer: Self-pay | Admitting: *Deleted

## 2014-05-05 ENCOUNTER — Encounter (HOSPITAL_COMMUNITY): Payer: Self-pay

## 2014-05-05 ENCOUNTER — Ambulatory Visit (HOSPITAL_COMMUNITY)
Admit: 2014-05-05 | Discharge: 2014-05-05 | Disposition: A | Discharge status: Home / Self Care | Payer: Managed Care, Other (non HMO) | Attending: *Deleted | Admitting: *Deleted

## 2014-05-05 DIAGNOSIS — M549 Dorsalgia, unspecified: Secondary | ICD-10-CM | POA: Insufficient documentation

## 2014-05-05 DIAGNOSIS — I10 Essential (primary) hypertension: Secondary | ICD-10-CM | POA: Insufficient documentation

## 2014-05-05 DIAGNOSIS — Z860100 Personal history of colon polyps, unspecified: Secondary | ICD-10-CM | POA: Insufficient documentation

## 2014-05-05 DIAGNOSIS — R109 Unspecified abdominal pain: Secondary | ICD-10-CM

## 2014-05-05 DIAGNOSIS — Z9889 Other specified postprocedural states: Secondary | ICD-10-CM | POA: Insufficient documentation

## 2014-05-05 DIAGNOSIS — Z91040 Latex allergy status: Secondary | ICD-10-CM | POA: Insufficient documentation

## 2014-05-05 DIAGNOSIS — Z79899 Other long term (current) drug therapy: Secondary | ICD-10-CM | POA: Insufficient documentation

## 2014-05-05 DIAGNOSIS — Z872 Personal history of diseases of the skin and subcutaneous tissue: Secondary | ICD-10-CM | POA: Insufficient documentation

## 2014-05-05 DIAGNOSIS — K439 Ventral hernia without obstruction or gangrene: Secondary | ICD-10-CM | POA: Insufficient documentation

## 2014-05-05 LAB — COMPREHENSIVE METABOLIC PANEL
ALBUMIN: 4 g/dL (ref 3.5–5.2)
ALT: 16 U/L (ref 0–35)
ANION GAP: 13 (ref 5–15)
AST: 17 U/L (ref 0–37)
Alkaline Phosphatase: 83 U/L (ref 39–117)
BILIRUBIN TOTAL: 0.4 mg/dL (ref 0.3–1.2)
BUN: 14 mg/dL (ref 6–23)
CHLORIDE: 97 meq/L (ref 96–112)
CO2: 28 mEq/L (ref 19–32)
Calcium: 10.1 mg/dL (ref 8.4–10.5)
Creatinine, Ser: 0.89 mg/dL (ref 0.50–1.10)
GFR calc Af Amer: 79 mL/min — ABNORMAL LOW (ref >90)
GFR calc non Af Amer: 69 mL/min — ABNORMAL LOW (ref >90)
GLUCOSE: 87 mg/dL (ref 70–99)
Potassium: 3.7 mEq/L (ref 3.7–5.3)
Sodium: 138 mEq/L (ref 137–147)
TOTAL PROTEIN: 7.3 g/dL (ref 6.0–8.3)

## 2014-05-05 LAB — CBC WITH DIFFERENTIAL/PLATELET
BASOS PCT: 0 % (ref 0–1)
Basophils Absolute: 0 10*3/uL (ref 0.0–0.1)
EOS ABS: 0.1 10*3/uL (ref 0.0–0.7)
Eosinophils Relative: 2 % (ref 0–5)
HCT: 39.3 % (ref 36.0–46.0)
HEMOGLOBIN: 13.4 g/dL (ref 12.0–15.0)
LYMPHS ABS: 1.8 10*3/uL (ref 0.7–4.0)
Lymphocytes Relative: 34 % (ref 12–46)
MCH: 30.2 pg (ref 26.0–34.0)
MCHC: 34.1 g/dL (ref 30.0–36.0)
MCV: 88.5 fL (ref 78.0–100.0)
MONOS PCT: 9 % (ref 3–12)
Monocytes Absolute: 0.5 10*3/uL (ref 0.1–1.0)
NEUTROS ABS: 3 10*3/uL (ref 1.7–7.7)
Neutrophils Relative %: 55 % (ref 43–77)
PLATELETS: 148 10*3/uL — AB (ref 150–400)
RBC: 4.44 MIL/uL (ref 3.87–5.11)
RDW: 13.4 % (ref 11.5–15.5)
WBC: 5.4 10*3/uL (ref 4.0–10.5)

## 2014-05-05 LAB — I-STAT CG4 LACTIC ACID, ED: Lactic Acid, Venous: 0.83 mmol/L (ref 0.5–2.2)

## 2014-05-05 LAB — LIPASE, BLOOD: Lipase: 19 U/L (ref 11–59)

## 2014-05-05 MED ORDER — ONDANSETRON HCL 4 MG/2ML IJ SOLN
4.0000 mg | Freq: Once | INTRAMUSCULAR | Status: AC
  Administered 2014-05-05: 4 mg via INTRAVENOUS
  Filled 2014-05-05: qty 2

## 2014-05-05 MED ORDER — IOHEXOL 300 MG/ML  SOLN
100.0000 mL | Freq: Once | INTRAMUSCULAR | Status: AC | PRN
  Administered 2014-05-05: 100 mL via INTRAVENOUS

## 2014-05-05 MED ORDER — HYDROCODONE-ACETAMINOPHEN 5-325 MG PO TABS: 1.0000 | ORAL_TABLET | ORAL | Status: DC | PRN

## 2014-05-05 MED ORDER — MORPHINE SULFATE 4 MG/ML IJ SOLN
4.0000 mg | Freq: Once | INTRAMUSCULAR | Status: AC
  Administered 2014-05-05: 4 mg via INTRAVENOUS
  Filled 2014-05-05: qty 1

## 2014-05-05 NOTE — ED Provider Notes (Signed)
Patient discussed with Dr. Silvio Clayman. I agree with his assessment. I evaluated the patient. I reviewed her CT scan. On exam she is a palpable defect in her periumbilical abdominal wall. When she stands there is palpable herniation. When she lays down there is spontaneous reduction. She has some tenderness in the periumbilical abdomen.  Patient is appropriate for outpatient treatment. Surgical followup. Limited number of pain medication in the next few days. Avoid heavy activity lifting or straining. Surgical followup for discussion of elective repair.  Tanna Furry, MD 05/05/14 2005

## 2014-05-05 NOTE — ED Notes (Signed)
Pt returned to ED from radiology due to critical scan results, CT was unable to contact ordering doctor and was told to bring patient to ED for further evaluation

## 2014-05-05 NOTE — ED Notes (Signed)
Pt to ED from home c/o back pain x 2 day and abdominal pain starting at 4am.  Pt reports abd pain started at 4am; described as a "pulling" pain from front of abdomen to back. Pt was seen at [facility]; sent here for CT scan. Was brought to ED from CT with abnormal results.  Pt tender in left lower quadrant

## 2014-05-05 NOTE — ED Notes (Signed)
After arrival to ED it was found out that patient was supposed to go to outpatient radiology, pt will be dismissed from ED due to not needing this visit.

## 2014-05-05 NOTE — ED Provider Notes (Signed)
CSN: [id number]     Arrival date & time 05/05/14  1239 History   First MD Initiated Contact with Patient 05/05/14 [place]     Chief Complaint  Patient presents with  . Abdominal Pain  . Back Pain     (Consider location/radiation/quality/duration/timing/severity/associated sxs/prior Treatment) Patient is a 63 y.o. female presenting with abdominal pain and back pain.  Abdominal Pain Pain location:  Epigastric Pain quality: sharp   Pain quality comment:  "pulling" Pain radiates to:  Does not radiate Pain severity:  Moderate Onset quality:  Sudden Duration:  12 hours Timing:  Constant Chronicity:  New Relieved by:  None tried Associated symptoms: no chest pain, no chills, no cough, no diarrhea, no dysuria, no fever, no nausea, no shortness of breath, no sore throat, no vaginal bleeding and no vomiting   Back Pain Associated symptoms: abdominal pain   Associated symptoms: no chest pain, no dysuria, no fever, no headaches and no numbness     Past Medical History  Diagnosis Date  . Hypertension   . GERD (gastroesophageal reflux disease)   . Eczema   . Hx of colonic polyps   . Vertigo     Dx 10/16/11   Past Surgical History  Procedure Laterality Date  . Cesarean section      1 time  . Uterine fibroid surgery     Family History  Problem Relation Age of Onset  . Hypertension Other   . Cancer Other     Uterine Cancer   History  Substance Use Topics  . Smoking status: Never Smoker   . Smokeless tobacco: Never Used  . Alcohol Use: No   OB History   Grav Para Term Preterm Abortions TAB SAB Ect Mult Living                 Review of Systems  Constitutional: Negative for fever and chills.  HENT: Negative for sore throat.   Eyes: Negative for pain.  Respiratory: Negative for cough and shortness of breath.   Cardiovascular: Negative for chest pain.  Gastrointestinal: Positive for abdominal pain. Negative for nausea, vomiting and diarrhea.  Genitourinary: Negative for  dysuria and vaginal bleeding.  Musculoskeletal: Positive for back pain.  Skin: Negative for rash.  Neurological: Negative for numbness and headaches.      Allergies  Lipitor; Codeine sulfate; and Latex  Home Medications   Prior to Admission medications   Medication Sig Start Date End Date Taking? Authorizing Provider  Cholecalciferol (VITAMIN D) 2000 UNITS CAPS Take 2,000 Units by mouth daily.    Yes Historical Provider, MD  lisinopril-hydrochlorothiazide (ZESTORETIC) 10-12.5 MG per tablet Take 1 tablet by mouth daily.   Yes Historical Provider, MD  Multiple Vitamins-Minerals (CENTRUM ULTRA WOMENS) TABS Take 1 tablet by mouth daily.     Yes Historical Provider, MD  Pitavastatin Calcium (LIVALO) 2 MG TABS Take 1 tablet (2 mg total) by mouth daily. 02/12/14  Yes Janith Lima, MD  VITAMIN E PO Take 1 tablet by mouth daily.    Yes Historical Provider, MD  HYDROcodone-acetaminophen (NORCO/VICODIN) 5-325 MG per tablet Take 1 tablet by mouth every 4 (four) hours as needed for severe pain. 05/05/14   Freddi Che, MD   BP 173/86  Pulse 68  Temp(Src) 98.7 F (37.1 C) (Oral)  Resp 20  Ht 5\' 1"  (1.549 m)  Wt 180 lb (81.647 kg)  BMI 34.03 kg/m2  SpO2 97% Physical Exam  Constitutional: She is oriented to person, place, and time. She appears  well-developed and well-nourished. No distress.  HENT:  Head: Normocephalic and atraumatic.  Eyes: Pupils are equal, round, and reactive to light. Right eye exhibits no discharge. Left eye exhibits no discharge.  Neck: Normal range of motion.  Cardiovascular: Normal rate, regular rhythm and normal heart sounds.   Pulmonary/Chest: Effort normal and breath sounds normal.  Abdominal: Soft. She exhibits no distension. There is tenderness in the epigastric area. There is no rigidity, no rebound, no guarding and no tenderness at McBurney's point.  Musculoskeletal: Normal range of motion.  Neurological: She is alert and oriented to person, place, and time.   Skin: Skin is warm. She is not diaphoretic.    ED Course  Procedures (including critical care time) Labs Review Labs Reviewed  CBC WITH DIFFERENTIAL - Abnormal; Notable for the following:    Platelets 148 (*)    All other components within normal limits  COMPREHENSIVE METABOLIC PANEL - Abnormal; Notable for the following:    GFR calc non Af Amer 69 (*)    GFR calc Af Amer 79 (*)    All other components within normal limits  LIPASE, BLOOD  I-STAT CG4 LACTIC ACID, ED    Imaging Review Ct Abdomen Pelvis W Contrast  05/05/2014   CLINICAL DATA:  Left mid to lower abdominal pain  EXAM: CT ABDOMEN AND PELVIS WITH CONTRAST  TECHNIQUE: Multidetector CT imaging of the abdomen and pelvis was performed using the standard protocol following bolus administration of intravenous contrast.  CONTRAST:  127mL OMNIPAQUE IOHEXOL 300 MG/ML  SOLN  COMPARISON:  None.  FINDINGS: Visualized portions of the lung bases are clear. There are no acute musculoskeletal findings.  There are a few tiny filling defects in the fundus of the gallbladder seen best on the coronal view. These could represent a few calculi or sludge. Liver is normal except for 7 mm low-attenuation lesion medial left lobe image number 25. There is a 4 mm low-attenuation lesion lateral left lobe image 14. There is a 5 mm low-attenuation lesion medial left lobe image number 27.  Spleen is normal.  Pancreas is normal.  Adrenal glands are normal.  There is calcification of the abdominal aorta without dilatation. There is a nonobstructive bowel gas pattern. Bladder is normal. Reproductive organs are normal.  There is a periumbilical hernia containing a loop of distal small bowel. There is mild inflammatory change surrounding the herniated loop of small bowel. Appendix is normal. Mild diverticulosis of the distal descending and sigmoid colon.  Right kidney demonstrates a 2 cm low-attenuation lesion in the midpole. It demonstrates an average attenuation value  of 20. Left kidney demonstrates a 3.3 cm low-attenuation lesion in the lower pole. It demonstrates an average attenuation value of 39. It demonstrates enhancing internal septations.  There is no significant abdominal or retroperitoneal adenopathy. Normal appearing patient's left renal vein.  IMPRESSION: 1. Small abdominal wall hernia containing small bowel. Although there is no evidence of obstruction, the possibility of strangulation must be considered given the mild surrounding inflammatory change. 2. Left renal mass highly concerning for renal cell carcinoma. Right renal lesion likely represents a cyst although demonstrates indeterminate attenuation. Recommend further evaluation of this latter lesion with ultrasound or MRI. These results were called by telephone at the time of interpretation on 05/05/2014 at 6:17 pm to Dr. Everardo Beals , who verbally acknowledged these results.   Electronically Signed   By: Skipper Cliche M.D.   On: 05/05/2014 18:17     EKG Interpretation None  MDM   Final diagnoses:  Abdominal wall hernia   63 year old female with a history of hypertension presents today with abdominal pain. She was sent to have a CT scan performed which was done prior to my arrival.  On my exam, the patient is in no acute distress. She is hemodynamically stable. The abdominal exam is significant for abdominal tenderness in the epigastric area. There is a small abdominal wall defect appreciated with ability to reduce her hernia. Her CT scan demonstrates small abdominal wall hernia containing small bowel. This is able to be reduced easily. We'll recommend followup with general surgery. Of note, the CT scan demonstrated left renal mass which may be concerning at this time. Recommended the patient followup with her primary care physician [facility]. The patient and the husband the bedside demonstrated understanding of this.  Patient will be discharged in stable condition. Strict  return precautions were given. Patient seen and evaluated by myself and by the attending Dr. Jeneen Rinks.    Freddi Che, MD 05/06/14 0110

## 2014-05-05 NOTE — ED Notes (Signed)
Pt reports having lower back pain x 2 days, today having mid abd pain that she describes as "pulling" pain. Denies any urinary symptoms or n/v/d. Last bm was yesterday and normal per pt. Sent here from [place].

## 2014-05-05 NOTE — ED Notes (Signed)
Dr. Walton at bedside 

## 2014-05-05 NOTE — Discharge Instructions (Signed)

## 2014-05-17 NOTE — ED Provider Notes (Signed)
I saw and evaluated the patient, reviewed the resident's note and I agree with the findings and plan.   EKG Interpretation None        Tanna Furry, MD 05/17/14 0002

## 2014-05-27 ENCOUNTER — Encounter: Payer: Self-pay | Admitting: Internal Medicine

## 2014-05-27 ENCOUNTER — Ambulatory Visit (INDEPENDENT_AMBULATORY_CARE_PROVIDER_SITE_OTHER): Payer: Managed Care, Other (non HMO) | Admitting: Internal Medicine

## 2014-05-27 VITALS — BP 198/84 | HR 65 | Temp 97.6°F | Resp 16 | Ht 61.0 in | Wt 184.0 lb

## 2014-05-27 DIAGNOSIS — E785 Hyperlipidemia, unspecified: Secondary | ICD-10-CM

## 2014-05-27 DIAGNOSIS — I10 Essential (primary) hypertension: Secondary | ICD-10-CM

## 2014-05-27 DIAGNOSIS — N289 Disorder of kidney and ureter, unspecified: Secondary | ICD-10-CM

## 2014-05-27 DIAGNOSIS — K219 Gastro-esophageal reflux disease without esophagitis: Secondary | ICD-10-CM

## 2014-05-27 DIAGNOSIS — N2889 Other specified disorders of kidney and ureter: Secondary | ICD-10-CM

## 2014-05-27 DIAGNOSIS — C649 Malignant neoplasm of unspecified kidney, except renal pelvis: Secondary | ICD-10-CM | POA: Insufficient documentation

## 2014-05-27 DIAGNOSIS — K436 Other and unspecified ventral hernia with obstruction, without gangrene: Secondary | ICD-10-CM

## 2014-05-27 DIAGNOSIS — L259 Unspecified contact dermatitis, unspecified cause: Secondary | ICD-10-CM

## 2014-05-27 DIAGNOSIS — E559 Vitamin D deficiency, unspecified: Secondary | ICD-10-CM

## 2014-05-27 MED ORDER — HYDROCODONE-ACETAMINOPHEN 5-325 MG PO TABS: 1.0000 | ORAL_TABLET | ORAL | Status: DC | PRN

## 2014-05-27 MED ORDER — NEBIVOLOL HCL 10 MG PO TABS: 10.0000 mg | ORAL_TABLET | Freq: Every day | ORAL | Status: DC

## 2014-05-27 NOTE — Patient Instructions (Signed)

## 2014-05-27 NOTE — Progress Notes (Signed)
Pre visit review using our clinic review tool, if applicable. No additional management support is needed unless otherwise documented below in the visit note. 

## 2014-05-27 NOTE — Progress Notes (Signed)
Subjective:    Patient ID: Teresa Collins, female    DOB: [birth year], 63 y.o.   MRN: [id number]  Hypertension This is a chronic problem. The current episode started more than 1 year ago. The problem has been gradually worsening since onset. The problem is uncontrolled. Pertinent negatives include no anxiety, blurred vision, chest pain, headaches, malaise/fatigue, neck pain, orthopnea, palpitations, peripheral edema, PND, shortness of breath or sweats. There are no associated agents to hypertension. Risk factors for coronary artery disease include obesity. Past treatments include ACE inhibitors and diuretics. The current treatment provides mild improvement. Compliance problems include diet and exercise.       Review of Systems  Constitutional: Negative.  Negative for fever, chills, malaise/fatigue, diaphoresis, appetite change and fatigue.  HENT: Negative.   Eyes: Negative.  Negative for blurred vision.  Respiratory: Negative.  Negative for apnea, cough, choking, chest tightness, shortness of breath, wheezing and stridor.   Cardiovascular: Negative.  Negative for chest pain, palpitations, orthopnea, leg swelling and PND.  Gastrointestinal: Positive for abdominal pain. Negative for vomiting, diarrhea, constipation, blood in stool and abdominal distention.  Endocrine: Negative.   Genitourinary: Negative.  Negative for dysuria, urgency, frequency, hematuria, flank pain, decreased urine volume and difficulty urinating.  Musculoskeletal: Positive for back pain. Negative for arthralgias, gait problem, joint swelling, myalgias, neck pain and neck stiffness.  Skin: Negative.  Negative for rash.  Allergic/Immunologic: Negative.   Neurological: Negative.  Negative for headaches.  Hematological: Negative.  Negative for adenopathy. Does not bruise/bleed easily.  Psychiatric/Behavioral: Negative.        Objective:   Physical Exam  Vitals reviewed. Constitutional: She is oriented to person,  place, and time. She appears well-developed and well-nourished.  Non-toxic appearance. She does not have a sickly appearance. She does not appear ill. No distress.  HENT:  Head: Normocephalic and atraumatic.  Mouth/Throat: Oropharynx is clear and moist. No oropharyngeal exudate.  Eyes: Conjunctivae are normal. Right eye exhibits no discharge. Left eye exhibits no discharge. No scleral icterus.  Neck: Normal range of motion. Neck supple. No JVD present. No tracheal deviation present. No thyromegaly present.  Cardiovascular: Normal rate, regular rhythm, normal heart sounds and intact distal pulses.  Exam reveals no gallop and no friction rub.   No murmur heard. Pulmonary/Chest: Effort normal and breath sounds normal. No stridor. No respiratory distress. She has no wheezes. She has no rales. She exhibits no tenderness.  Abdominal: Soft. Normal appearance and bowel sounds are normal. She exhibits no shifting dullness, no distension, no pulsatile liver, no fluid wave, no abdominal bruit, no ascites, no pulsatile midline mass and no mass. There is no hepatosplenomegaly, splenomegaly or hepatomegaly. There is no tenderness. There is no rebound, no guarding and no CVA tenderness. No hernia. Hernia confirmed negative in the ventral area, confirmed negative in the right inguinal area and confirmed negative in the left inguinal area.  Musculoskeletal: Normal range of motion. She exhibits no edema and no tenderness.  Lymphadenopathy:    She has no cervical adenopathy.  Neurological: She is oriented to person, place, and time.  Skin: Skin is warm and dry. No rash noted. She is not diaphoretic. No erythema. No pallor.  Psychiatric: She has a normal mood and affect. Her behavior is normal. Judgment and thought content normal.     Lab Results  Component Value Date   WBC 5.4 05/05/2014   HGB 13.4 05/05/2014   HCT 39.3 05/05/2014   PLT 148* 05/05/2014   GLUCOSE 87 05/05/2014  CHOL 238* 03/31/2013   TRIG 107.0  03/31/2013   HDL 45.20 03/31/2013   LDLDIRECT 166.8 03/31/2013   LDLCALC 132* 01/17/2012   ALT 16 05/05/2014   AST 17 05/05/2014   NA 138 05/05/2014   K 3.7 05/05/2014   CL 97 05/05/2014   CREATININE 0.89 05/05/2014   BUN 14 05/05/2014   CO2 28 05/05/2014   TSH 0.89 03/31/2013   HGBA1C 5.5 02/12/2014       Assessment & Plan:

## 2014-05-28 ENCOUNTER — Encounter (INDEPENDENT_AMBULATORY_CARE_PROVIDER_SITE_OTHER): Payer: Self-pay | Admitting: Surgery

## 2014-05-28 ENCOUNTER — Telehealth: Payer: Self-pay | Admitting: Internal Medicine

## 2014-05-28 ENCOUNTER — Ambulatory Visit (INDEPENDENT_AMBULATORY_CARE_PROVIDER_SITE_OTHER): Payer: Managed Care, Other (non HMO) | Admitting: Surgery

## 2014-05-28 VITALS — BP 126/74 | HR 62 | Temp 97.4°F | Ht 61.0 in | Wt 183.0 lb

## 2014-05-28 DIAGNOSIS — K436 Other and unspecified ventral hernia with obstruction, without gangrene: Secondary | ICD-10-CM

## 2014-05-28 NOTE — Assessment & Plan Note (Signed)
General surgery referral 

## 2014-05-28 NOTE — Patient Instructions (Signed)
I will be in a discussion with the urologist about timing of surgery and I will call you back

## 2014-05-28 NOTE — Telephone Encounter (Signed)
Relevant patient education assigned to patient using Emmi. ° °

## 2014-05-28 NOTE — Assessment & Plan Note (Signed)
Her BP is not well controlled I will add bystolic to the ACEI/HCTZ combo

## 2014-05-28 NOTE — Assessment & Plan Note (Signed)
Urology referral

## 2014-05-28 NOTE — Progress Notes (Signed)
Patient ID: Teresa Collins, female   DOB: Dec 29, 1950, 63 y.o.   MRN: [id number]  Chief Complaint  Patient presents with  . eval hernia    HPI Teresa Collins is a 63 y.o. female.  She is referred by Dr. [name] for evaluation of asymptomatic ventral hernia. Several weeks ago, she had back pain followup abdominal pain. She presented to the emergency department where a CAT scan was performed which showed an incarcerated, nonobstructed ventral hernia containing small bowel. She also had a left renal mass worrisome for a renal carcinoma. She is feeling better. She still has some abdominal pain but no nausea, vomiting, or obstructive symptoms. She has been referred to a [facility] u[facility] regarding the kidney mass.  She is moving her bowels well. She denies dysuria. She does moderate lifting at work and it has some discomfort with lifting. HPI  Past Medical History  Diagnosis Date  . Hypertension   . GERD (gastroesophageal reflux disease)   . Eczema   . Hx of colonic polyps   . Vertigo     Dx 10/16/11    Past Surgical History  Procedure Laterality Date  . Cesarean section      1 time  . Uterine fibroid surgery      Family History  Problem Relation Age of Onset  . Hypertension Other   . Cancer Other     Uterine Cancer    Social History History  Substance Use Topics  . Smoking status: Never Smoker   . Smokeless tobacco: Never Used  . Alcohol Use: No    Allergies  Allergen Reactions  . Lipitor [Atorvastatin] Other (See Comments)    Muscle aches  . Codeine Sulfate Hives    REACTION: rash and itching  . Latex Rash    Current Outpatient Prescriptions  Medication Sig Dispense Refill  . Cholecalciferol (VITAMIN D) 2000 UNITS CAPS Take 2,000 Units by mouth daily.       Marland Kitchen HYDROcodone-acetaminophen (NORCO/VICODIN) 5-325 MG per tablet Take 1 tablet by mouth every 4 (four) hours as needed for severe pain.  65 tablet  0  . lisinopril-hydrochlorothiazide (ZESTORETIC) 10-12.5 MG  per tablet Take 1 tablet by mouth daily.      . Multiple Vitamins-Minerals (CENTRUM ULTRA WOMENS) TABS Take 1 tablet by mouth daily.        . nebivolol (BYSTOLIC) 10 MG tablet Take 1 tablet (10 mg total) by mouth daily.  42 tablet  0  . Pitavastatin Calcium (LIVALO) 2 MG TABS Take 1 tablet (2 mg total) by mouth daily.  90 tablet  3  . VITAMIN E PO Take 1 tablet by mouth daily.        No current facility-administered medications for this visit.    Review of Systems Review of Systems  Constitutional: Negative for fever, chills and unexpected weight change.  HENT: Negative for congestion, hearing loss, sore throat, trouble swallowing and voice change.   Eyes: Negative for visual disturbance.  Respiratory: Negative for cough and wheezing.   Cardiovascular: Negative for chest pain, palpitations and leg swelling.  Gastrointestinal: Positive for abdominal pain. Negative for nausea, vomiting, diarrhea, constipation, blood in stool, abdominal distention and anal bleeding.  Genitourinary: Negative for hematuria, vaginal bleeding and difficulty urinating.  Musculoskeletal: Negative for arthralgias.  Skin: Negative for rash and wound.  Neurological: Negative for seizures, syncope and headaches.  Hematological: Negative for adenopathy. Does not bruise/bleed easily.  Psychiatric/Behavioral: Negative for confusion.    Blood pressure 126/74, pulse 62, temperature 97.4 F (  36.3 C), height 5\' 1"  (1.549 m), weight 183 lb (83.008 kg).  Physical Exam Physical Exam  Constitutional: She is oriented to person, place, and time. She appears well-developed and well-nourished. No distress.  HENT:  Head: Normocephalic and atraumatic.  Right Ear: External ear normal.  Left Ear: External ear normal.  Nose: Nose normal.  Mouth/Throat: Oropharynx is clear and moist.  Eyes: Conjunctivae are normal. Pupils are equal, round, and reactive to light. Right eye exhibits no discharge. Left eye exhibits no discharge. No  scleral icterus.  Neck: Normal range of motion. No tracheal deviation present.  Cardiovascular: Normal rate, regular rhythm and intact distal pulses.   Murmur heard. Pulmonary/Chest: Effort normal and breath sounds normal. No respiratory distress. She has no wheezes.  Abdominal: Soft. There is tenderness. There is guarding.  Her abdomen is soft and nontender. The hernia is below the umbilicus she has a little bit of guarding. I do not believe I can completely reduce the hernia which does not seem tender  Musculoskeletal: Normal range of motion. She exhibits no edema and no tenderness.  Lymphadenopathy:    She has no cervical adenopathy.  Neurological: She is alert and oriented to person, place, and time.  Skin: Skin is warm and dry. No rash noted. No erythema.  Psychiatric: Her behavior is normal. Judgment normal.    Data Reviewed I have reviewed her CAT scan of the abdomen and pelvis. There is a small midline hernia containing a loop of nonobstructed small intestines. There is also a left renal mass  Assessment    Incarcerated ventral hernia Left renal mass     Plan    I believe she needs repair of the hernia with mesh. She is being referred to [facility] for evaluation of the kidney mass. If she will need to undergo a nephrectomy or partial nephrectomy, we can repair the hernia with mesh under the same anesthesia. If she develops symptoms from the hernia it may need to be repaired emergently prior to her nephrectomy.  I discussed signs and symptoms of strangulated bowel with her. She should refrain from any heavy lifting or straining with lifting more than 15 pounds until surgery. We'll write her a note for work regarding this. We will send our notes to the urologist. Once she has seen them, we'll discuss the timing of her surgery. I will call her back        Apostolos Blagg A 05/28/2014, 10:50 AM

## 2014-06-02 ENCOUNTER — Telehealth (INDEPENDENT_AMBULATORY_CARE_PROVIDER_SITE_OTHER): Payer: Self-pay | Admitting: *Deleted

## 2014-06-02 NOTE — Telephone Encounter (Signed)
Pt called and wanted to let Dr. Rush Farmer and Delilah Shan know that her appt with Urologist / Dr. Louis Meckel is scheduled for 06-14-14.  Anderson Malta

## 2014-06-11 ENCOUNTER — Ambulatory Visit (INDEPENDENT_AMBULATORY_CARE_PROVIDER_SITE_OTHER): Payer: Managed Care, Other (non HMO) | Admitting: Internal Medicine

## 2014-06-11 ENCOUNTER — Encounter: Payer: Self-pay | Admitting: Internal Medicine

## 2014-06-11 VITALS — BP 130/82 | HR 54 | Temp 98.5°F | Resp 16 | Ht 61.0 in | Wt 183.0 lb

## 2014-06-11 DIAGNOSIS — I10 Essential (primary) hypertension: Secondary | ICD-10-CM

## 2014-06-11 DIAGNOSIS — N2889 Other specified disorders of kidney and ureter: Secondary | ICD-10-CM

## 2014-06-11 DIAGNOSIS — K436 Other and unspecified ventral hernia with obstruction, without gangrene: Secondary | ICD-10-CM

## 2014-06-11 DIAGNOSIS — N289 Disorder of kidney and ureter, unspecified: Secondary | ICD-10-CM

## 2014-06-11 NOTE — Progress Notes (Signed)
Pre visit review using our clinic review tool, if applicable. No additional management support is needed unless otherwise documented below in the visit note. 

## 2014-06-11 NOTE — Progress Notes (Signed)
Subjective:    Patient ID: Teresa Collins, female    DOB: January 30, 1951, 63 y.o.   MRN: [id number]  Abdominal Pain This is a recurrent problem. The current episode started more than 1 month ago. The onset quality is gradual. The problem occurs intermittently. The problem has been unchanged. The pain is located in the generalized abdominal region. The pain is at a severity of 3/10. The pain is mild. The quality of the pain is aching. The abdominal pain does not radiate. Associated symptoms include arthralgias. Pertinent negatives include no anorexia, belching, constipation, diarrhea, dysuria, fever, flatus, frequency, headaches, hematochezia, hematuria, melena, myalgias, nausea, vomiting or weight loss. Nothing aggravates the pain. She has tried oral narcotic analgesics for the symptoms. The treatment provided moderate relief. Prior diagnostic workup includes CT scan.      Review of Systems  Constitutional: Negative.  Negative for fever, chills, weight loss, diaphoresis, appetite change and fatigue.  HENT: Negative.   Eyes: Negative.   Respiratory: Negative.  Negative for cough, choking, chest tightness, shortness of breath and stridor.   Cardiovascular: Negative.  Negative for chest pain, palpitations and leg swelling.  Gastrointestinal: Positive for abdominal pain. Negative for nausea, vomiting, diarrhea, constipation, blood in stool, melena, hematochezia, abdominal distention, anal bleeding, rectal pain, anorexia and flatus.  Endocrine: Negative.   Genitourinary: Negative.  Negative for dysuria, urgency, frequency, hematuria, flank pain, difficulty urinating and pelvic pain.  Musculoskeletal: Positive for arthralgias and back pain. Negative for gait problem, joint swelling, myalgias, neck pain and neck stiffness.  Skin: Negative.  Negative for rash.  Allergic/Immunologic: Negative.   Neurological: Negative.  Negative for headaches.  Hematological: Negative.  Negative for adenopathy. Does  not bruise/bleed easily.  Psychiatric/Behavioral: Negative.        Objective:   Physical Exam  Vitals reviewed. Constitutional: She is oriented to person, place, and time. She appears well-developed and well-nourished.  Non-toxic appearance. She does not have a sickly appearance. She does not appear ill. No distress.  HENT:  Head: Normocephalic and atraumatic.  Mouth/Throat: Oropharynx is clear and moist. No oropharyngeal exudate.  Eyes: Conjunctivae are normal. Right eye exhibits no discharge. Left eye exhibits no discharge. No scleral icterus.  Neck: Normal range of motion. Neck supple. No JVD present. No tracheal deviation present. No thyromegaly present.  Cardiovascular: Normal rate, regular rhythm, normal heart sounds and intact distal pulses.  Exam reveals no gallop and no friction rub.   No murmur heard. Pulmonary/Chest: Effort normal and breath sounds normal. No stridor. No respiratory distress. She has no wheezes. She has no rales. She exhibits no tenderness.  Abdominal: Soft. Normal appearance and bowel sounds are normal. She exhibits no shifting dullness, no distension, no pulsatile liver, no fluid wave, no abdominal bruit, no ascites, no pulsatile midline mass and no mass. There is no hepatosplenomegaly, splenomegaly or hepatomegaly. There is tenderness in the left upper quadrant. There is no rigidity, no rebound, no guarding, no CVA tenderness, no tenderness at McBurney's point and negative Murphy's sign. No hernia. Hernia confirmed negative in the ventral area, confirmed negative in the right inguinal area and confirmed negative in the left inguinal area.  Musculoskeletal: Normal range of motion. She exhibits no edema and no tenderness.  Lymphadenopathy:    She has no cervical adenopathy.  Neurological: She is oriented to person, place, and time.  Skin: Skin is warm and dry. No rash noted. She is not diaphoretic. No erythema. No pallor.     Lab Results  Component Value Date  WBC 5.4 05/05/2014   HGB 13.4 05/05/2014   HCT 39.3 05/05/2014   PLT 148* 05/05/2014   GLUCOSE 87 05/05/2014   CHOL 238* 03/31/2013   TRIG 107.0 03/31/2013   HDL 45.20 03/31/2013   LDLDIRECT 166.8 03/31/2013   LDLCALC 132* 01/17/2012   ALT 16 05/05/2014   AST 17 05/05/2014   NA 138 05/05/2014   K 3.7 05/05/2014   CL 97 05/05/2014   CREATININE 0.89 05/05/2014   BUN 14 05/05/2014   CO2 28 05/05/2014   TSH 0.89 03/31/2013   HGBA1C 5.5 02/12/2014       Assessment & Plan:

## 2014-06-14 ENCOUNTER — Ambulatory Visit: Payer: Managed Care, Other (non HMO) | Admitting: Internal Medicine

## 2014-06-15 ENCOUNTER — Encounter: Payer: Self-pay | Admitting: Internal Medicine

## 2014-06-15 NOTE — Assessment & Plan Note (Signed)
Her BP is well controlled 

## 2014-06-15 NOTE — Assessment & Plan Note (Signed)
She will see urology soon Plans likely will include biopsy/removal along with repair of the hernia

## 2014-06-15 NOTE — Assessment & Plan Note (Signed)
She has seen general surgery Will have this repaired with the removal of the renal mass Will cont pain meds as needed

## 2014-06-16 ENCOUNTER — Other Ambulatory Visit: Payer: Self-pay | Admitting: Urology

## 2014-06-16 DIAGNOSIS — N281 Cyst of kidney, acquired: Secondary | ICD-10-CM

## 2014-06-16 DIAGNOSIS — N2889 Other specified disorders of kidney and ureter: Secondary | ICD-10-CM

## 2014-06-23 ENCOUNTER — Ambulatory Visit
Admission: RE | Admit: 2014-06-23 | Discharge: 2014-06-23 | Disposition: A | Discharge status: Home / Self Care | Payer: PRIVATE HEALTH INSURANCE | Source: Ambulatory Visit | Attending: Urology | Admitting: Urology

## 2014-06-23 DIAGNOSIS — N2889 Other specified disorders of kidney and ureter: Secondary | ICD-10-CM

## 2014-06-23 MED ORDER — GADOBENATE DIMEGLUMINE 529 MG/ML IV SOLN
15.0000 mL | Freq: Once | INTRAVENOUS | Status: AC | PRN
  Administered 2014-06-23: 15 mL via INTRAVENOUS

## 2014-07-06 ENCOUNTER — Ambulatory Visit (HOSPITAL_COMMUNITY)
Admission: RE | Admit: 2014-07-06 | Discharge: 2014-07-06 | Disposition: A | Discharge status: Home / Self Care | Payer: PRIVATE HEALTH INSURANCE | Source: Ambulatory Visit | Attending: Urology | Admitting: Urology

## 2014-07-06 ENCOUNTER — Other Ambulatory Visit (HOSPITAL_COMMUNITY): Payer: Self-pay | Admitting: Urology

## 2014-07-06 DIAGNOSIS — N289 Disorder of kidney and ureter, unspecified: Secondary | ICD-10-CM

## 2014-07-06 DIAGNOSIS — I10 Essential (primary) hypertension: Secondary | ICD-10-CM | POA: Diagnosis not present

## 2014-07-07 ENCOUNTER — Other Ambulatory Visit (INDEPENDENT_AMBULATORY_CARE_PROVIDER_SITE_OTHER): Payer: Self-pay | Admitting: Surgery

## 2014-07-09 ENCOUNTER — Other Ambulatory Visit: Payer: Self-pay | Admitting: Urology

## 2014-07-12 ENCOUNTER — Encounter (HOSPITAL_COMMUNITY): Payer: Self-pay | Admitting: Pharmacy Technician

## 2014-07-13 ENCOUNTER — Encounter: Payer: Self-pay | Admitting: Internal Medicine

## 2014-07-13 NOTE — Progress Notes (Signed)
Called Pam RN to request order from Dr. Louis Meckel be put in [place] surgery 07-21-14 pre op 07-19-14 Thanks

## 2014-07-16 NOTE — Patient Instructions (Addendum)
Teresa Collins  07/16/2014                           YOUR PROCEDURE IS SCHEDULED ON:  07/21/14               ENTER THRU [facility] ENTRANCE AND                            FOLLOW  SIGNS TO SHORT STAY CENTER                 ARRIVE AT SHORT STAY AT:  8:30 AM               CALL THIS NUMBER IF ANY PROBLEMS THE DAY OF SURGERY :               [phone]                                REMEMBER:   Do not eat food or drink liquids AFTER MIDNIGHT              STOP ASPIRIN AND HERBAL MEDS 5 DAYS PREOP                  Take these medicines the morning of surgery with               A SIPS OF WATER : BYSTOLIC / MAY TAKE VICODIN IF NEEDED        Do not wear jewelry, make-up   Do not wear lotions, powders, or perfumes.   Do not shave legs or underarms 12 hrs. before surgery (men may shave face)  Do not bring valuables to the hospital.  Contacts, dentures or bridgework may not be worn into surgery.  Leave suitcase in the car. After surgery it may be brought to your room.  For patients admitted to the hospital more than one night, checkout time is            11:00 AM                                                        ________________________________________________________________________                                                                                                  [place]  Before surgery, you can play an important role.  Because skin is not sterile, your skin needs to be as free of germs as possible.  You can reduce the number of germs on your skin by washing with CHG (chlorahexidine gluconate) soap before surgery.  CHG is an antiseptic cleaner which kills germs and bonds with the skin to continue killing germs even after washing. Please DO NOT use  if you have an allergy to CHG or antibacterial soaps.  If your skin becomes reddened/irritated stop using the CHG and inform your nurse when you arrive at Short  Stay. Do not shave (including legs and underarms) for at least 48 hours prior to the first CHG shower.  You may shave your face. Please follow these instructions carefully:   1.  Shower with CHG Soap the night before surgery and the  morning of Surgery.   2.  If you choose to wash your hair, wash your hair first as usual with your  normal  Shampoo.   3.  After you shampoo, rinse your hair and body thoroughly to remove the  shampoo.                                         4.  Use CHG as you would any other liquid soap.  You can apply chg directly  to the skin and wash . Gently wash with scrungie or clean wascloth    5.  Apply the CHG Soap to your body ONLY FROM THE NECK DOWN.   Do not use on open                           Wound or open sores. Avoid contact with eyes, ears mouth and genitals (private parts).                        Genitals (private parts) with your normal soap.              6.  Wash thoroughly, paying special attention to the area where your surgery  will be performed.   7.  Thoroughly rinse your body with warm water from the neck down.   8.  DO NOT shower/wash with your normal soap after using and rinsing off  the CHG Soap .                9.  Pat yourself dry with a clean towel.             10.  Wear clean pajamas.             11.  Place clean sheets on your bed the night of your first shower and do not  sleep with pets.  Day of Surgery : Do not apply any lotions/deodorants the morning of surgery.  Please wear clean clothes to the hospital/surgery center.  FAILURE TO FOLLOW THESE INSTRUCTIONS MAY RESULT IN THE CANCELLATION OF YOUR SURGERY    PATIENT SIGNATURE_________________________________  ______________________________________________________________________    WHAT IS A BLOOD TRANSFUSION? Blood Transfusion Information  A transfusion is the replacement of blood or some of its parts. Blood is made up of multiple cells which provide different  functions.  Red blood cells carry oxygen and are used for blood loss replacement.  White blood cells fight against infection.  Platelets control bleeding.  Plasma helps clot blood.  Other blood products are available for specialized needs, such as hemophilia or other clotting disorders. BEFORE THE TRANSFUSION  Who gives blood for transfusions?   Healthy volunteers who are fully evaluated to make sure their blood is safe. This is blood bank blood. Transfusion therapy is the safest it has ever been in the practice of medicine. Before blood is taken from a donor, a  complete history is taken to make sure that person has no history of diseases nor engages in risky social behavior (examples are intravenous drug use or sexual activity with multiple partners). The donor's travel history is screened to minimize risk of transmitting infections, such as malaria. The donated blood is tested for signs of infectious diseases, such as HIV and hepatitis. The blood is then tested to be sure it is compatible with you in order to minimize the chance of a transfusion reaction. If you or a relative donates blood, this is often done in anticipation of surgery and is not appropriate for emergency situations. It takes many days to process the donated blood. RISKS AND COMPLICATIONS Although transfusion therapy is very safe and saves many lives, the main dangers of transfusion include:   Getting an infectious disease.  Developing a transfusion reaction. This is an allergic reaction to something in the blood you were given. Every precaution is taken to prevent this. The decision to have a blood transfusion has been considered carefully by your caregiver before blood is given. Blood is not given unless the benefits outweigh the risks. AFTER THE TRANSFUSION  Right after receiving a blood transfusion, you will usually feel much better and more energetic. This is especially true if your red blood cells have gotten low  (anemic). The transfusion raises the level of the red blood cells which carry oxygen, and this usually causes an energy increase.  The nurse administering the transfusion will monitor you carefully for complications. HOME CARE INSTRUCTIONS  No special instructions are needed after a transfusion. You may find your energy is better. Speak with your caregiver about any limitations on activity for underlying diseases you may have. SEEK MEDICAL CARE IF:   Your condition is not improving after your transfusion.  You develop redness or irritation at the intravenous (IV) site. SEEK IMMEDIATE MEDICAL CARE IF:  Any of the following symptoms occur over the next 12 hours:  Shaking chills.  You have a temperature by mouth above 102 F (38.9 C), not controlled by medicine.  Chest, back, or muscle pain.  People around you feel you are not acting correctly or are confused.  Shortness of breath or difficulty breathing.  Dizziness and fainting.  You get a rash or develop hives.  You have a decrease in urine output.  Your urine turns a dark color or changes to pink, red, or brown. Any of the following symptoms occur over the next 10 days:  You have a temperature by mouth above 102 F (38.9 C), not controlled by medicine.  Shortness of breath.  Weakness after normal activity.  The white part of the eye turns yellow (jaundice).  You have a decrease in the amount of urine or are urinating less often.  Your urine turns a dark color or changes to pink, red, or brown. Document Released: 10/05/2000 Document Revised: 12/31/2011 Document Reviewed: 05/24/2008 [facility] Patient Information 2014 [place], [place].  _______________________________________________________________________

## 2014-07-19 ENCOUNTER — Encounter (HOSPITAL_COMMUNITY)
Admission: RE | Admit: 2014-07-19 | Discharge: 2014-07-19 | Disposition: A | Discharge status: Home / Self Care | Payer: PRIVATE HEALTH INSURANCE | Source: Ambulatory Visit | Attending: Surgery | Admitting: Surgery

## 2014-07-19 ENCOUNTER — Telehealth: Payer: Self-pay | Admitting: Internal Medicine

## 2014-07-19 ENCOUNTER — Encounter (HOSPITAL_COMMUNITY): Payer: Self-pay

## 2014-07-19 DIAGNOSIS — I10 Essential (primary) hypertension: Secondary | ICD-10-CM

## 2014-07-19 HISTORY — DX: Paresthesia of skin: R20.2

## 2014-07-19 HISTORY — DX: Hyperlipidemia, unspecified: E78.5

## 2014-07-19 HISTORY — DX: Incisional hernia without obstruction or gangrene: K43.2

## 2014-07-19 HISTORY — DX: Anesthesia of skin: R20.0

## 2014-07-19 HISTORY — DX: Unspecified osteoarthritis, unspecified site: M19.90

## 2014-07-19 LAB — CBC
HEMATOCRIT: 38.2 % (ref 36.0–46.0)
Hemoglobin: 13 g/dL (ref 12.0–15.0)
MCH: 29.6 pg (ref 26.0–34.0)
MCHC: 34 g/dL (ref 30.0–36.0)
MCV: 87 fL (ref 78.0–100.0)
Platelets: 172 10*3/uL (ref 150–400)
RBC: 4.39 MIL/uL (ref 3.87–5.11)
RDW: 13.4 % (ref 11.5–15.5)
WBC: 5.7 10*3/uL (ref 4.0–10.5)

## 2014-07-19 LAB — COMPREHENSIVE METABOLIC PANEL
ALT: 15 U/L (ref 0–35)
AST: 17 U/L (ref 0–37)
Albumin: 4 g/dL (ref 3.5–5.2)
Alkaline Phosphatase: 82 U/L (ref 39–117)
Anion gap: 12 (ref 5–15)
BILIRUBIN TOTAL: 0.4 mg/dL (ref 0.3–1.2)
BUN: 19 mg/dL (ref 6–23)
CHLORIDE: 101 meq/L (ref 96–112)
CO2: 27 mEq/L (ref 19–32)
Calcium: 10.6 mg/dL — ABNORMAL HIGH (ref 8.4–10.5)
Creatinine, Ser: 1.09 mg/dL (ref 0.50–1.10)
GFR calc non Af Amer: 53 mL/min — ABNORMAL LOW (ref >90)
GFR, EST AFRICAN AMERICAN: 62 mL/min — AB (ref >90)
Glucose, Bld: 103 mg/dL — ABNORMAL HIGH (ref 70–99)
Potassium: 4.2 mEq/L (ref 3.7–5.3)
Sodium: 140 mEq/L (ref 137–147)
Total Protein: 7.8 g/dL (ref 6.0–8.3)

## 2014-07-19 LAB — ABO/RH: ABO/RH(D): O POS

## 2014-07-19 MED ORDER — NEBIVOLOL HCL 10 MG PO TABS: 10.0000 mg | ORAL_TABLET | Freq: Every morning | ORAL | Status: DC

## 2014-07-19 NOTE — Telephone Encounter (Signed)
Pt called in and said that thenebivolol (BYSTOLIC) 10 MG tablet [[id number]] is 100.00 with her ins and she can not afford that . She wanted to know is there anything else she can take other then this med?     Best number is cell number

## 2014-07-19 NOTE — Telephone Encounter (Signed)
Patient is requesting script for bystolic to be called in to [facility] [address].

## 2014-07-19 NOTE — Telephone Encounter (Signed)
RX approved.

## 2014-07-20 ENCOUNTER — Telehealth: Payer: Self-pay | Admitting: *Deleted

## 2014-07-20 LAB — URINE CULTURE
Colony Count: NO GROWTH
Culture: NO GROWTH

## 2014-07-20 MED ORDER — CARVEDILOL 6.25 MG PO TABS: 6.2500 mg | ORAL_TABLET | Freq: Two times a day (BID) | ORAL | Status: DC

## 2014-07-20 NOTE — Telephone Encounter (Signed)
Changed to a generic 

## 2014-07-20 NOTE — Telephone Encounter (Signed)
Pt informed of below.  

## 2014-07-20 NOTE — H&P (Signed)
Patient ID: Burnis Kingfisher, female DOB: 1950/12/23, 63 y.o. MRN: [id number]  Chief Complaint   Patient presents with   .  eval hernia   HPI  Teresa Collins is a 63 y.o. female. She is referred by Dr. [name] for evaluation of asymptomatic ventral hernia. Several weeks ago, she had back pain followup abdominal pain. She presented to the emergency department where a CAT scan was performed which showed an incarcerated, nonobstructed ventral hernia containing small bowel. She also had a left renal mass worrisome for a renal carcinoma. She is feeling better. She still has some abdominal pain but no nausea, vomiting, or obstructive symptoms. She has been referred to a [facility] regarding the kidney mass. She is moving her bowels well. She denies dysuria. She does moderate lifting at work and it has some discomfort with lifting.  HPI  Past Medical History   Diagnosis  Date   .  Hypertension    .  GERD (gastroesophageal reflux disease)    .  Eczema    .  Hx of colonic polyps    .  Vertigo      Dx 10/16/11    Past Surgical History   Procedure  Laterality  Date   .  Cesarean section       1 time   .  Uterine fibroid surgery      Family History   Problem  Relation  Age of Onset   .  Hypertension  Other    .  Cancer  Other      Uterine Cancer   Social History  History   Substance Use Topics   .  Smoking status:  Never Smoker   .  Smokeless tobacco:  Never Used   .  Alcohol Use:  No    Allergies   Allergen  Reactions   .  Lipitor [Atorvastatin]  Other (See Comments)     Muscle aches   .  Codeine Sulfate  Hives     REACTION: rash and itching   .  Latex  Rash    Current Outpatient Prescriptions   Medication  Sig  Dispense  Refill   .  Cholecalciferol (VITAMIN D) 2000 UNITS CAPS  Take 2,000 Units by mouth daily.     Marland Kitchen  HYDROcodone-acetaminophen (NORCO/VICODIN) 5-325 MG per tablet  Take 1 tablet by mouth every 4 (four) hours as needed for severe pain.  65 tablet  0   .   lisinopril-hydrochlorothiazide (ZESTORETIC) 10-12.5 MG per tablet  Take 1 tablet by mouth daily.     .  Multiple Vitamins-Minerals (CENTRUM ULTRA WOMENS) TABS  Take 1 tablet by mouth daily.     .  nebivolol (BYSTOLIC) 10 MG tablet  Take 1 tablet (10 mg total) by mouth daily.  42 tablet  0   .  Pitavastatin Calcium (LIVALO) 2 MG TABS  Take 1 tablet (2 mg total) by mouth daily.  90 tablet  3   .  VITAMIN E PO  Take 1 tablet by mouth daily.      No current facility-administered medications for this visit.   Review of Systems  Review of Systems  Constitutional: Negative for fever, chills and unexpected weight change.  HENT: Negative for congestion, hearing loss, sore throat, trouble swallowing and voice change.  Eyes: Negative for visual disturbance.  Respiratory: Negative for cough and wheezing.  Cardiovascular: Negative for chest pain, palpitations and leg swelling.  Gastrointestinal: Positive for abdominal pain. Negative for nausea, vomiting, diarrhea, constipation,  blood in stool, abdominal distention and anal bleeding.  Genitourinary: Negative for hematuria, vaginal bleeding and difficulty urinating.  Musculoskeletal: Negative for arthralgias.  Skin: Negative for rash and wound.  Neurological: Negative for seizures, syncope and headaches.  Hematological: Negative for adenopathy. Does not bruise/bleed easily.  Psychiatric/Behavioral: Negative for confusion.  Blood pressure 126/74, pulse 62, temperature 97.4 F (36.3 C), height 5\' 1"  (1.549 m), weight 183 lb (83.008 kg).  Physical Exam  Physical Exam  Constitutional: She is oriented to person, place, and time. She appears well-developed and well-nourished. No distress.  HENT:  Head: Normocephalic and atraumatic.  Right Ear: External ear normal.  Left Ear: External ear normal.  Nose: Nose normal.  Mouth/Throat: Oropharynx is clear and moist.  Eyes: Conjunctivae are normal. Pupils are equal, round, and reactive to light. Right eye  exhibits no discharge. Left eye exhibits no discharge. No scleral icterus.  Neck: Normal range of motion. No tracheal deviation present.  Cardiovascular: Normal rate, regular rhythm and intact distal pulses.  Murmur heard.  Pulmonary/Chest: Effort normal and breath sounds normal. No respiratory distress. She has no wheezes.  Abdominal: Soft. There is tenderness. There is guarding.  Her abdomen is soft and nontender. The hernia is below the umbilicus she has a little bit of guarding. I do not believe I can completely reduce the hernia which does not seem tender  Musculoskeletal: Normal range of motion. She exhibits no edema and no tenderness.  Lymphadenopathy:  She has no cervical adenopathy.  Neurological: She is alert and oriented to person, place, and time.  Skin: Skin is warm and dry. No rash noted. No erythema.  Psychiatric: Her behavior is normal. Judgment normal.  Data Reviewed  I have reviewed her CAT scan of the abdomen and pelvis. There is a small midline hernia containing a loop of nonobstructed small intestines. There is also a left renal mass   Assessment  Incarcerated ventral hernia  Left renal mass  Plan   I believe she needs repair of the hernia with mesh. She is being referred to [facility] for evaluation of the kidney mass. If she will need to undergo a nephrectomy or partial nephrectomy, we can repair the hernia with mesh under the same anesthesia. If she develops symptoms from the hernia it may need to be repaired emergently prior to her nephrectomy. I discussed signs and symptoms of strangulated bowel with her. She should refrain from any heavy lifting or straining with lifting more than 15 pounds until surgery. We'll write her a note for work regarding this. We will send our notes to the urologist. Once she has seen them, we'll discuss the timing of her surgery.   Addendum:  I again discussed the surgical procedure with the patient. The actual fascial defect was  quite small. It will be determined intraoperatively the we will proceed with open or laparoscopic repair of the hernia with mesh after the partial nephrectomy. I discussed the risks of repair which includes but is not limited to bleeding, infection, injury to the intestines, need to convert to an open procedure, use of mesh, cardiopulmonary issues, recurrence, etc. We will be proceeding with surgery in conjunction with the urologist

## 2014-07-20 NOTE — Telephone Encounter (Signed)
Pt called stating after she took one of the Carvedilol, she passed out. EMS came. She woke up surrounded by EMS workers. She did not go to ER. She is having two different surgeries tomorrow. She wants to know what she should do. She has d/c Carvdilol. Please advise in PCP's absence.

## 2014-07-20 NOTE — Telephone Encounter (Signed)
Okay to stop carvedilol and proceed with the surgery. If recurrence she needs to seek medical attention.

## 2014-07-21 ENCOUNTER — Encounter (HOSPITAL_COMMUNITY): Payer: PRIVATE HEALTH INSURANCE | Admitting: Certified Registered"

## 2014-07-21 ENCOUNTER — Inpatient Hospital Stay (HOSPITAL_COMMUNITY): Payer: PRIVATE HEALTH INSURANCE | Admitting: Certified Registered"

## 2014-07-21 ENCOUNTER — Encounter (HOSPITAL_COMMUNITY)
Admission: RE | Disposition: A | Discharge status: Home / Self Care | Payer: Self-pay | Source: Ambulatory Visit | Attending: Urology

## 2014-07-21 ENCOUNTER — Inpatient Hospital Stay (HOSPITAL_COMMUNITY)
Admission: RE | Admit: 2014-07-21 | Discharge: 2014-07-23 | DRG: 657 | Disposition: A | Discharge status: Home / Self Care | Payer: PRIVATE HEALTH INSURANCE | Source: Ambulatory Visit | Attending: Urology | Admitting: Urology

## 2014-07-21 ENCOUNTER — Encounter (HOSPITAL_COMMUNITY): Payer: Self-pay | Admitting: *Deleted

## 2014-07-21 DIAGNOSIS — Z86010 Personal history of colonic polyps: Secondary | ICD-10-CM

## 2014-07-21 DIAGNOSIS — N280 Ischemia and infarction of kidney: Secondary | ICD-10-CM | POA: Diagnosis not present

## 2014-07-21 DIAGNOSIS — Z79899 Other long term (current) drug therapy: Secondary | ICD-10-CM | POA: Diagnosis not present

## 2014-07-21 DIAGNOSIS — Z8049 Family history of malignant neoplasm of other genital organs: Secondary | ICD-10-CM | POA: Diagnosis not present

## 2014-07-21 DIAGNOSIS — C649 Malignant neoplasm of unspecified kidney, except renal pelvis: Secondary | ICD-10-CM | POA: Diagnosis present

## 2014-07-21 DIAGNOSIS — C642 Malignant neoplasm of left kidney, except renal pelvis: Secondary | ICD-10-CM | POA: Diagnosis present

## 2014-07-21 DIAGNOSIS — Z885 Allergy status to narcotic agent status: Secondary | ICD-10-CM | POA: Diagnosis not present

## 2014-07-21 DIAGNOSIS — I10 Essential (primary) hypertension: Secondary | ICD-10-CM | POA: Diagnosis present

## 2014-07-21 DIAGNOSIS — H409 Unspecified glaucoma: Secondary | ICD-10-CM | POA: Diagnosis present

## 2014-07-21 DIAGNOSIS — K439 Ventral hernia without obstruction or gangrene: Secondary | ICD-10-CM | POA: Diagnosis present

## 2014-07-21 DIAGNOSIS — Z23 Encounter for immunization: Secondary | ICD-10-CM | POA: Diagnosis not present

## 2014-07-21 DIAGNOSIS — Z888 Allergy status to other drugs, medicaments and biological substances status: Secondary | ICD-10-CM | POA: Diagnosis not present

## 2014-07-21 DIAGNOSIS — Z9851 Tubal ligation status: Secondary | ICD-10-CM

## 2014-07-21 DIAGNOSIS — E785 Hyperlipidemia, unspecified: Secondary | ICD-10-CM | POA: Diagnosis present

## 2014-07-21 DIAGNOSIS — M1990 Unspecified osteoarthritis, unspecified site: Secondary | ICD-10-CM | POA: Diagnosis present

## 2014-07-21 DIAGNOSIS — Z8249 Family history of ischemic heart disease and other diseases of the circulatory system: Secondary | ICD-10-CM | POA: Diagnosis not present

## 2014-07-21 DIAGNOSIS — R109 Unspecified abdominal pain: Secondary | ICD-10-CM | POA: Diagnosis present

## 2014-07-21 DIAGNOSIS — Z91040 Latex allergy status: Secondary | ICD-10-CM

## 2014-07-21 DIAGNOSIS — K219 Gastro-esophageal reflux disease without esophagitis: Secondary | ICD-10-CM | POA: Diagnosis present

## 2014-07-21 DIAGNOSIS — Z01812 Encounter for preprocedural laboratory examination: Secondary | ICD-10-CM | POA: Diagnosis not present

## 2014-07-21 HISTORY — PX: INSERTION OF MESH: SHX5868

## 2014-07-21 HISTORY — PX: ROBOTIC ASSITED PARTIAL NEPHRECTOMY: SHX6087

## 2014-07-21 HISTORY — PX: VENTRAL HERNIA REPAIR: SHX424

## 2014-07-21 LAB — TYPE AND SCREEN
ABO/RH(D): O POS
ANTIBODY SCREEN: NEGATIVE

## 2014-07-21 LAB — HEMOGLOBIN AND HEMATOCRIT, BLOOD
HEMATOCRIT: 34.6 % — AB (ref 36.0–46.0)
Hemoglobin: 11.7 g/dL — ABNORMAL LOW (ref 12.0–15.0)

## 2014-07-21 SURGERY — REPAIR, HERNIA, VENTRAL, LAPAROSCOPIC
Anesthesia: General

## 2014-07-21 MED ORDER — STERILE WATER FOR IRRIGATION IR SOLN
Status: DC | PRN
  Administered 2014-07-21: 3000 mL

## 2014-07-21 MED ORDER — CIPROFLOXACIN IN D5W 400 MG/200ML IV SOLN
INTRAVENOUS | Status: AC
  Filled 2014-07-21: qty 200

## 2014-07-21 MED ORDER — NEBIVOLOL HCL 10 MG PO TABS
10.0000 mg | ORAL_TABLET | Freq: Every day | ORAL | Status: DC
  Administered 2014-07-22 – 2014-07-23 (×2): 10 mg via ORAL
  Filled 2014-07-21 (×2): qty 1

## 2014-07-21 MED ORDER — EPHEDRINE SULFATE 50 MG/ML IJ SOLN
INTRAMUSCULAR | Status: DC | PRN
  Administered 2014-07-21: 5 mg via INTRAVENOUS
  Administered 2014-07-21: 10 mg via INTRAVENOUS

## 2014-07-21 MED ORDER — CEFAZOLIN SODIUM-DEXTROSE 2-3 GM-% IV SOLR
INTRAVENOUS | Status: AC
  Filled 2014-07-21: qty 50

## 2014-07-21 MED ORDER — ONDANSETRON HCL 4 MG/2ML IJ SOLN
INTRAMUSCULAR | Status: AC
  Filled 2014-07-21: qty 2

## 2014-07-21 MED ORDER — CEFAZOLIN SODIUM-DEXTROSE 2-3 GM-% IV SOLR
2.0000 g | Freq: Once | INTRAVENOUS | Status: AC
  Administered 2014-07-21: 2 g via INTRAVENOUS

## 2014-07-21 MED ORDER — ONDANSETRON HCL 4 MG/2ML IJ SOLN
INTRAMUSCULAR | Status: DC | PRN
  Administered 2014-07-21: 4 mg via INTRAVENOUS

## 2014-07-21 MED ORDER — HYDROMORPHONE HCL 1 MG/ML IJ SOLN
INTRAMUSCULAR | Status: AC
  Filled 2014-07-21: qty 1

## 2014-07-21 MED ORDER — LACTATED RINGERS IR SOLN
Status: DC | PRN
  Administered 2014-07-21: 1000 mL

## 2014-07-21 MED ORDER — BUPIVACAINE HCL (PF) 0.5 % IJ SOLN
INTRAMUSCULAR | Status: AC
  Filled 2014-07-21: qty 30

## 2014-07-21 MED ORDER — EPHEDRINE SULFATE 50 MG/ML IJ SOLN
INTRAMUSCULAR | Status: AC
  Filled 2014-07-21: qty 1

## 2014-07-21 MED ORDER — NEOSTIGMINE METHYLSULFATE 10 MG/10ML IV SOLN
INTRAVENOUS | Status: DC | PRN
  Administered 2014-07-21: 5 mg via INTRAVENOUS

## 2014-07-21 MED ORDER — SODIUM CHLORIDE 0.9 % IJ SOLN
INTRAMUSCULAR | Status: DC | PRN
  Administered 2014-07-21: 5 mL

## 2014-07-21 MED ORDER — HYDROMORPHONE HCL 1 MG/ML IJ SOLN
0.2500 mg | INTRAMUSCULAR | Status: DC | PRN
  Administered 2014-07-21 (×4): 0.5 mg via INTRAVENOUS

## 2014-07-21 MED ORDER — FENTANYL CITRATE 0.05 MG/ML IJ SOLN
INTRAMUSCULAR | Status: DC | PRN
  Administered 2014-07-21 (×2): 100 ug via INTRAVENOUS

## 2014-07-21 MED ORDER — DEXTROSE IN LACTATED RINGERS 5 % IV SOLN
INTRAVENOUS | Status: DC
  Administered 2014-07-21 – 2014-07-22 (×2): via INTRAVENOUS

## 2014-07-21 MED ORDER — SENNOSIDES-DOCUSATE SODIUM 8.6-50 MG PO TABS
2.0000 | ORAL_TABLET | Freq: Every day | ORAL | Status: DC
  Administered 2014-07-21 – 2014-07-22 (×2): 2 via ORAL
  Filled 2014-07-21 (×3): qty 2

## 2014-07-21 MED ORDER — HYDROCODONE-ACETAMINOPHEN 5-325 MG PO TABS
1.0000 | ORAL_TABLET | ORAL | Status: DC | PRN
  Administered 2014-07-22 – 2014-07-23 (×4): 2 via ORAL
  Filled 2014-07-21 (×4): qty 2

## 2014-07-21 MED ORDER — CIPROFLOXACIN IN D5W 400 MG/200ML IV SOLN
400.0000 mg | INTRAVENOUS | Status: AC
  Administered 2014-07-21: 400 mg via INTRAVENOUS

## 2014-07-21 MED ORDER — LIDOCAINE HCL (PF) 2 % IJ SOLN
INTRAMUSCULAR | Status: DC | PRN
  Administered 2014-07-21: 20 mg via INTRADERMAL

## 2014-07-21 MED ORDER — CEFAZOLIN SODIUM-DEXTROSE 2-3 GM-% IV SOLR
2.0000 g | INTRAVENOUS | Status: AC
  Administered 2014-07-21: 2 g via INTRAVENOUS

## 2014-07-21 MED ORDER — HYDROMORPHONE HCL 1 MG/ML IJ SOLN
INTRAMUSCULAR | Status: AC
Start: 2014-07-21 — End: 2014-07-21
  Filled 2014-07-21: qty 1

## 2014-07-21 MED ORDER — INFLUENZA VAC SPLIT QUAD 0.5 ML IM SUSY
0.5000 mL | PREFILLED_SYRINGE | INTRAMUSCULAR | Status: AC
  Administered 2014-07-22: 0.5 mL via INTRAMUSCULAR
  Filled 2014-07-21 (×2): qty 0.5

## 2014-07-21 MED ORDER — DOCUSATE SODIUM 100 MG PO CAPS
100.0000 mg | ORAL_CAPSULE | Freq: Two times a day (BID) | ORAL | Status: DC
  Administered 2014-07-21: 100 mg via ORAL
  Filled 2014-07-21 (×3): qty 1

## 2014-07-21 MED ORDER — MIDAZOLAM HCL 5 MG/5ML IJ SOLN
INTRAMUSCULAR | Status: DC | PRN
  Administered 2014-07-21: 2 mg via INTRAVENOUS

## 2014-07-21 MED ORDER — DOCUSATE SODIUM 100 MG PO CAPS: 100.0000 mg | ORAL_CAPSULE | Freq: Two times a day (BID) | ORAL | Status: DC

## 2014-07-21 MED ORDER — ROCURONIUM BROMIDE 100 MG/10ML IV SOLN
INTRAVENOUS | Status: DC | PRN
  Administered 2014-07-21 (×3): 10 mg via INTRAVENOUS
  Administered 2014-07-21: 50 mg via INTRAVENOUS
  Administered 2014-07-21 (×2): 10 mg via INTRAVENOUS

## 2014-07-21 MED ORDER — PHENYLEPHRINE HCL 10 MG/ML IJ SOLN
INTRAMUSCULAR | Status: DC | PRN
  Administered 2014-07-21: 80 ug via INTRAVENOUS

## 2014-07-21 MED ORDER — ROCURONIUM BROMIDE 100 MG/10ML IV SOLN
INTRAVENOUS | Status: AC
  Filled 2014-07-21: qty 1

## 2014-07-21 MED ORDER — DEXAMETHASONE SODIUM PHOSPHATE 10 MG/ML IJ SOLN
INTRAMUSCULAR | Status: DC | PRN
  Administered 2014-07-21: 10 mg via INTRAVENOUS

## 2014-07-21 MED ORDER — CEFAZOLIN SODIUM-DEXTROSE 2-3 GM-% IV SOLR
2.0000 g | Freq: Three times a day (TID) | INTRAVENOUS | Status: AC
  Administered 2014-07-21 – 2014-07-22 (×2): 2 g via INTRAVENOUS
  Filled 2014-07-21 (×2): qty 50

## 2014-07-21 MED ORDER — HYDROMORPHONE HCL 1 MG/ML IJ SOLN
0.5000 mg | INTRAMUSCULAR | Status: DC | PRN
  Administered 2014-07-21 (×2): 0.5 mg via INTRAVENOUS

## 2014-07-21 MED ORDER — HYDROCODONE-ACETAMINOPHEN 5-325 MG PO TABS: 1.0000 | ORAL_TABLET | Freq: Four times a day (QID) | ORAL | Status: DC | PRN

## 2014-07-21 MED ORDER — SODIUM CHLORIDE 0.9 % IJ SOLN
INTRAMUSCULAR | Status: AC
  Filled 2014-07-21: qty 20

## 2014-07-21 MED ORDER — PHENYLEPHRINE 40 MCG/ML (10ML) SYRINGE FOR IV PUSH (FOR BLOOD PRESSURE SUPPORT)
PREFILLED_SYRINGE | INTRAVENOUS | Status: AC
  Filled 2014-07-21: qty 10

## 2014-07-21 MED ORDER — MANNITOL 25 % IV SOLN
25.0000 g | Freq: Once | INTRAVENOUS | Status: AC
  Administered 2014-07-21 (×2): 12.5 g via INTRAVENOUS
  Filled 2014-07-21: qty 100

## 2014-07-21 MED ORDER — SODIUM CHLORIDE 0.9 % IJ SOLN
INTRAMUSCULAR | Status: AC
  Filled 2014-07-21: qty 10

## 2014-07-21 MED ORDER — HYDROMORPHONE HCL 2 MG/ML IJ SOLN
INTRAMUSCULAR | Status: AC
  Filled 2014-07-21: qty 1

## 2014-07-21 MED ORDER — HYDROMORPHONE HCL 1 MG/ML IJ SOLN
INTRAMUSCULAR | Status: DC | PRN
  Administered 2014-07-21: 1 mg via INTRAVENOUS
  Administered 2014-07-21: 0.5 mg via INTRAVENOUS

## 2014-07-21 MED ORDER — FENTANYL CITRATE 0.05 MG/ML IJ SOLN
INTRAMUSCULAR | Status: AC
  Filled 2014-07-21: qty 5

## 2014-07-21 MED ORDER — MIDAZOLAM HCL 2 MG/2ML IJ SOLN
INTRAMUSCULAR | Status: AC
  Filled 2014-07-21: qty 2

## 2014-07-21 MED ORDER — MORPHINE SULFATE 2 MG/ML IJ SOLN
2.0000 mg | INTRAMUSCULAR | Status: DC | PRN
  Administered 2014-07-21 – 2014-07-22 (×2): 2 mg via INTRAVENOUS
  Administered 2014-07-22 – 2014-07-23 (×4): 4 mg via INTRAVENOUS
  Filled 2014-07-21: qty 1
  Filled 2014-07-21 (×4): qty 2
  Filled 2014-07-21: qty 1

## 2014-07-21 MED ORDER — PROPOFOL 10 MG/ML IV BOLUS
INTRAVENOUS | Status: AC
  Filled 2014-07-21: qty 20

## 2014-07-21 MED ORDER — LACTATED RINGERS IV SOLN: INTRAVENOUS | Status: DC

## 2014-07-21 MED ORDER — BUPIVACAINE LIPOSOME 1.3 % IJ SUSP
20.0000 mL | Freq: Once | INTRAMUSCULAR | Status: AC
  Administered 2014-07-21: 5 mL
  Filled 2014-07-21: qty 20

## 2014-07-21 MED ORDER — DIPHENHYDRAMINE HCL 12.5 MG/5ML PO ELIX: 12.5000 mg | ORAL_SOLUTION | Freq: Four times a day (QID) | ORAL | Status: DC | PRN

## 2014-07-21 MED ORDER — GLYCOPYRROLATE 0.2 MG/ML IJ SOLN
INTRAMUSCULAR | Status: AC
  Filled 2014-07-21: qty 4

## 2014-07-21 MED ORDER — GLYCOPYRROLATE 0.2 MG/ML IJ SOLN
INTRAMUSCULAR | Status: DC | PRN
  Administered 2014-07-21: .8 mg via INTRAVENOUS

## 2014-07-21 MED ORDER — BUPIVACAINE HCL (PF) 0.5 % IJ SOLN
INTRAMUSCULAR | Status: DC | PRN
  Administered 2014-07-21: 14 mL

## 2014-07-21 MED ORDER — LACTATED RINGERS IV SOLN
INTRAVENOUS | Status: DC | PRN
  Administered 2014-07-21 (×3): via INTRAVENOUS

## 2014-07-21 MED ORDER — PROPOFOL 10 MG/ML IV BOLUS
INTRAVENOUS | Status: DC | PRN
  Administered 2014-07-21: 150 mg via INTRAVENOUS

## 2014-07-21 MED ORDER — VITAMIN D 1000 UNITS PO TABS
2000.0000 [IU] | ORAL_TABLET | Freq: Every day | ORAL | Status: DC
  Administered 2014-07-22 – 2014-07-23 (×2): 2000 [IU] via ORAL
  Filled 2014-07-21 (×2): qty 2

## 2014-07-21 MED ORDER — DIPHENHYDRAMINE HCL 50 MG/ML IJ SOLN: 12.5000 mg | Freq: Four times a day (QID) | INTRAMUSCULAR | Status: DC | PRN

## 2014-07-21 SURGICAL SUPPLY — 91 items
APPLICATOR SURGIFLO ENDO (HEMOSTASIS) ×3 IMPLANT
BANDAGE ADH SHEER 1  50/CT (GAUZE/BANDAGES/DRESSINGS) IMPLANT
BENZOIN TINCTURE PRP APPL 2/3 (GAUZE/BANDAGES/DRESSINGS) IMPLANT
BINDER ABDOMINAL 12 ML 46-62 (SOFTGOODS) IMPLANT
CABLE HIGH FREQUENCY MONO STRZ (ELECTRODE) ×3 IMPLANT
CANISTER SUCTION 2500CC (MISCELLANEOUS) IMPLANT
CHLORAPREP W/TINT 26ML (MISCELLANEOUS) ×6 IMPLANT
CLIP LIGATING HEM O LOK PURPLE (MISCELLANEOUS) ×3 IMPLANT
CLIP LIGATING HEMO LOK XL GOLD (MISCELLANEOUS) IMPLANT
CLIP LIGATING HEMO O LOK GREEN (MISCELLANEOUS) ×12 IMPLANT
CORDS BIPOLAR (ELECTRODE) ×3 IMPLANT
COVER SURGICAL LIGHT HANDLE (MISCELLANEOUS) ×3 IMPLANT
COVER TIP SHEARS 8 DVNC (MISCELLANEOUS) ×2 IMPLANT
COVER TIP SHEARS 8MM DA VINCI (MISCELLANEOUS) ×1
DECANTER SPIKE VIAL GLASS SM (MISCELLANEOUS) IMPLANT
DERMABOND ADVANCED (GAUZE/BANDAGES/DRESSINGS) ×2
DERMABOND ADVANCED .7 DNX12 (GAUZE/BANDAGES/DRESSINGS) ×4 IMPLANT
DEVICE TROCAR PUNCTURE CLOSURE (ENDOMECHANICALS) IMPLANT
DISSECTOR BLUNT TIP ENDO 5MM (MISCELLANEOUS) IMPLANT
DRAIN CHANNEL 15F RND FF 3/16 (WOUND CARE) ×3 IMPLANT
DRAIN CHANNEL RND F F (WOUND CARE) IMPLANT
DRAPE INCISE IOBAN 66X45 STRL (DRAPES) ×3 IMPLANT
DRAPE LAPAROSCOPIC ABDOMINAL (DRAPES) ×3 IMPLANT
DRAPE SHEET LG 3/4 BI-LAMINATE (DRAPES) ×6 IMPLANT
DRAPE TABLE BACK 44X90 PK DISP (DRAPES) ×3 IMPLANT
DRAPE WARM FLUID 44X44 (DRAPE) ×3 IMPLANT
DRSG TEGADERM 2-3/8X2-3/4 SM (GAUZE/BANDAGES/DRESSINGS) IMPLANT
ELECT REM PT RETURN 9FT ADLT (ELECTROSURGICAL) ×6
ELECTRODE REM PT RTRN 9FT ADLT (ELECTROSURGICAL) ×4 IMPLANT
EVACUATOR SILICONE 100CC (DRAIN) IMPLANT
GLOVE BIO SURGEON STRL SZ 6.5 (GLOVE) IMPLANT
GLOVE BIOGEL M STRL SZ7.5 (GLOVE) IMPLANT
GLOVE BIOGEL PI IND STRL 7.0 (GLOVE) ×2 IMPLANT
GLOVE BIOGEL PI INDICATOR 7.0 (GLOVE) ×1
GLOVE SURG SIGNA 7.5 PF LTX (GLOVE) ×3 IMPLANT
GOWN STRL REUS W/TWL LRG LVL3 (GOWN DISPOSABLE) ×9 IMPLANT
GOWN STRL REUS W/TWL XL LVL3 (GOWN DISPOSABLE) ×6 IMPLANT
KIT ACCESSORY DA VINCI DISP (KITS) ×1
KIT ACCESSORY DVNC DISP (KITS) ×2 IMPLANT
KIT BASIN OR (CUSTOM PROCEDURE TRAY) ×3 IMPLANT
LOOP VESSEL MAXI BLUE (MISCELLANEOUS) ×3 IMPLANT
MARKER SKIN DUAL TIP RULER LAB (MISCELLANEOUS) ×3 IMPLANT
MESH VENTRALEX ST 2.5 CRC MED (Mesh General) ×3 IMPLANT
NEEDLE INSUFFLATION 14GA 120MM (NEEDLE) IMPLANT
NEEDLE INSUFFLATION 14GA 150MM (NEEDLE) ×3 IMPLANT
NEEDLE SPNL 22GX3.5 QUINCKE BK (NEEDLE) IMPLANT
NS IRRIG 1000ML POUR BTL (IV SOLUTION) IMPLANT
PENCIL BUTTON HOLSTER BLD 10FT (ELECTRODE) ×6 IMPLANT
POSITIONER SURGICAL ARM (MISCELLANEOUS) ×3 IMPLANT
POUCH SPECIMEN RETRIEVAL 10MM (ENDOMECHANICALS) ×3 IMPLANT
SCISSORS LAP 5X35 DISP (ENDOMECHANICALS) IMPLANT
SET IRRIG TUBING LAPAROSCOPIC (IRRIGATION / IRRIGATOR) IMPLANT
SET TUBE IRRIG SUCTION NO TIP (IRRIGATION / IRRIGATOR) ×3 IMPLANT
SHEARS HARMONIC ACE PLUS 36CM (ENDOMECHANICALS) IMPLANT
SOLUTION ANTI FOG 6CC (MISCELLANEOUS) ×6 IMPLANT
SOLUTION ELECTROLUBE (MISCELLANEOUS) ×3 IMPLANT
SPONGE DRAIN TRACH 4X4 STRL 2S (GAUZE/BANDAGES/DRESSINGS) ×3 IMPLANT
SPONGE LAP 4X18 X RAY DECT (DISPOSABLE) IMPLANT
STAPLER VISISTAT 35W (STAPLE) IMPLANT
STRIP CLOSURE SKIN 1/2X4 (GAUZE/BANDAGES/DRESSINGS) IMPLANT
SURGIFLO W/THROMBIN 8M KIT (HEMOSTASIS) ×6 IMPLANT
SUT ETHILON 3 0 PS 1 (SUTURE) ×3 IMPLANT
SUT MNCRL AB 4-0 PS2 18 (SUTURE) ×15 IMPLANT
SUT MON AB 5-0 PS2 18 (SUTURE) IMPLANT
SUT NOVA 0 T19/GS 22DT (SUTURE) ×6 IMPLANT
SUT NOVA NAB DX-16 0-1 5-0 T12 (SUTURE) ×6 IMPLANT
SUT PDS AB 1 CT1 27 (SUTURE) ×3 IMPLANT
SUT V-LOC BARB 180 2/0GR6 GS22 (SUTURE) ×6
SUT V-LOC BARB 180 2/0GR9 GS23 (SUTURE)
SUT VIC AB 3-0 SH 27 (SUTURE) ×1
SUT VIC AB 3-0 SH 27X BRD (SUTURE) ×2 IMPLANT
SUT VICRYL 0 UR6 27IN ABS (SUTURE) ×6 IMPLANT
SUT VLOC BARB 180 ABS3/0GR12 (SUTURE) ×12
SUTURE V-LC BRB 180 2/0GR6GS22 (SUTURE) ×4 IMPLANT
SUTURE V-LC BRB 180 2/0GR9GS23 (SUTURE) IMPLANT
SUTURE VLOC BRB 180 ABS3/0GR12 (SUTURE) ×8 IMPLANT
SYR BULB IRRIGATION 50ML (SYRINGE) IMPLANT
TACKER 5MM HERNIA 3.5CML NAB (ENDOMECHANICALS) IMPLANT
TOWEL OR 17X26 10 PK STRL BLUE (TOWEL DISPOSABLE) ×6 IMPLANT
TOWEL OR NON WOVEN STRL DISP B (DISPOSABLE) ×6 IMPLANT
TRAY FOLEY CATH 14FRSI W/METER (CATHETERS) IMPLANT
TRAY LAP CHOLE (CUSTOM PROCEDURE TRAY) ×3 IMPLANT
TROCAR 12M 150ML BLUNT (TROCAR) ×3 IMPLANT
TROCAR BLADELESS OPT 5 75 (ENDOMECHANICALS) IMPLANT
TROCAR XCEL 12X100 BLDLESS (ENDOMECHANICALS) ×3 IMPLANT
TROCAR XCEL NON-BLD 11X100MML (ENDOMECHANICALS) IMPLANT
TROCAR XCEL NON-BLD 5MMX100MML (ENDOMECHANICALS) ×3 IMPLANT
TROCAR XCEL UNIV SLVE 11M 100M (ENDOMECHANICALS) IMPLANT
TUBING FILTER THERMOFLATOR (ELECTROSURGICAL) IMPLANT
TUBING INSUFFLATION 10FT LAP (TUBING) IMPLANT
WATER STERILE IRR 1500ML POUR (IV SOLUTION) IMPLANT

## 2014-07-21 NOTE — Anesthesia Preprocedure Evaluation (Addendum)
Anesthesia Evaluation  Patient identified by MRN, date of birth, ID band Patient awake    Reviewed: Allergy & Precautions, H&P , NPO status , Patient's Chart, lab work & pertinent test results, reviewed documented beta blocker date and time   Airway Mallampati: II TM Distance: >3 FB Neck ROM: full    Dental no notable dental hx. (+) Teeth Intact, Dental Advisory Given   Pulmonary neg pulmonary ROS,  breath sounds clear to auscultation  Pulmonary exam normal       Cardiovascular Exercise Tolerance: Good hypertension, Pt. on medications and Pt. on home beta blockers Rhythm:regular Rate:Normal     Neuro/Psych negative neurological ROS  negative psych ROS   GI/Hepatic negative GI ROS, Neg liver ROS,   Endo/Other  negative endocrine ROS  Renal/GU negative Renal ROS  negative genitourinary   Musculoskeletal   Abdominal Normal abdominal exam  (+)   Peds  Hematology negative hematology ROS (+)   Anesthesia Other Findings   Reproductive/Obstetrics negative OB ROS                         Anesthesia Physical Anesthesia Plan  ASA: II  Anesthesia Plan: General   Post-op Pain Management:    Induction: Intravenous  Airway Management Planned: Oral ETT  Additional Equipment:   Intra-op Plan:   Post-operative Plan: Extubation in OR  Informed Consent: I have reviewed the patients History and Physical, chart, labs and discussed the procedure including the risks, benefits and alternatives for the proposed anesthesia with the patient or authorized representative who has indicated his/her understanding and acceptance.   Dental Advisory Given  Plan Discussed with: CRNA and Surgeon  Anesthesia Plan Comments:         Anesthesia Quick Evaluation

## 2014-07-21 NOTE — H&P (Signed)
Reason For Visit f/u for left lower pole renal mass   History of Present Illness 44F presents for f/u of left lower pole renal mass. This was found incidentally as part of a w/u for abdominal pain. She also was found to have a ventral hernia. She has seen Dr. Ninfa Linden who recommended surgical repair. She has had an MRI of her kidneys to confirm the appearance of the mass. The MRI confirms a bosniak 4 lower pole cyst on the left.  Again the patient denies any hematuria or flank pain. She has no recent weight loss. For her renal mass she is asymptomatic. The patient continues to have pain and discomfort from her ventral hernia. She denies any nausea or vomiting, just discomfort.   Past Medical History Problems  1. History of arthritis (V13.4) 2. History of colonic polyps (V12.72) 3. History of esophageal reflux (V12.79) 4. History of glaucoma (V12.49) 5. History of hypertension (V12.59) 6. History of vertigo (V12.49)  Surgical History Problems  1. History of Cesarean Section 2. History of Tubal Ligation 3. History of Uterine Fibroid Embolization  Current Meds 1. Bystolic 10 MG Oral Tablet;  Therapy: (Recorded:24Aug2015) to Recorded 2. Hydrocodone-Acetaminophen 5-325 MG Oral Tablet;  Therapy: (Recorded:24Aug2015) to Recorded 3. Lisinopril-Hydrochlorothiazide 10-12.5 MG Oral Tablet;  Therapy: (Recorded:24Aug2015) to Recorded 4. Livalo 2 MG Oral Tablet;  Therapy: (Recorded:24Aug2015) to Recorded 5. Multiple Vitamin TABS;  Therapy: (Recorded:24Aug2015) to Recorded 6. Vitamin D 2000 UNIT Oral Tablet;  Therapy: (Recorded:24Aug2015) to Recorded 7. Vitamin E TABS;  Therapy: (Recorded:24Aug2015) to Recorded  Allergies Medication  1. Codeine Sulfate TABS 2. Lipitor TABS Non-Medication  3. Latex  Family History Problems  1. Family history of Death of family member : Father   Unknown by patient 2. Family history of hypertension (V17.49) : Sister, Brother  Social  History Problems  1. Alcohol use (V49.89)   2 a month 2. Caffeine use (V49.89)   1-2 per day 3. Never a smoker 4. Number of children   1 daughter 5. Single  Vitals Vital Signs [Data Includes: Last 1 Day]  Recorded: 15Sep2015 08:41AM  Blood Pressure: 169 / 80 Temperature: 97.4 F Heart Rate: 62  Physical Exam Constitutional: Well nourished and well developed . No acute distress.  ENT:. The ears and nose are normal in appearance.  Neck: The appearance of the neck is normal and no neck mass is present.  Pulmonary: No respiratory distress and normal respiratory rhythm and effort.  Cardiovascular: Heart rate and rhythm are normal. A grade /6 systolic murmur was heard.  Abdomen: The abdomen is soft and nontender. No masses are palpated. No CVA tenderness. Which appears incarcerated. No hepatosplenomegaly noted.  Lymphatics: The femoral and inguinal nodes are not enlarged or tender.  Skin: Normal skin turgor, no visible rash and no visible skin lesions.  Neuro/Psych:. Mood and affect are appropriate.    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 Recommend urology  consultation for surgical evaluation.    Simple right renal cyst. No  evidence of right renal neoplasm.    Assessment 63 year old female with a Bosniak 4 lower pole left renal mass confirmed on follow-up MRI. She also has a incisional hernia which she is symptomatic from and has been scheduled for repair by Dr. Ninfa Linden.   Plan Health Maintenance  1. UA With  REFLEX; [Do Not Release]; Status:Complete;   Done: [id number] 08:31AM Left renal mass  2. Follow-up Schedule Surgery Office  Follow-up  Status: Complete  Done: [id number]  Discussion/Summary I went over the MRI results the patient. I again discussed the natural history of small renal masses. I went over the options with the patient including surveillance, IR ablation, and surgical excision. Given the patient's young age and otherwise excellent health. Likely benefit most from partial nephrectomy.  The patient has been given the natural history of renal cancer, treatment options, and recommended surgical extirpation for this patient. I went over the robotic-assisted laparoscopic partial nephrectomy approach. I described for the patient the procedure in detail including port placement. I detailed the postoperative course including the fact that the patient would have both a drain and a Foley catheter following the surgery. I told the patient that most often patients are discharged on postoperative day one or 2. I then detailed the expected recovery time, I told the patient that he would not be able to lift anything greater than 20 pounds for 4 weeks. I also went over the risks and benefits of this operation in great detail. We discussed the risk of injury to surrounding structures, major blood vessels and nerves, bleeding, infection, loss of kidney, and the risk of recurrent cancer. I will plan to coordinate this with Dr. Ninfa Linden so that she can have her ventral hernia repaired on the same anesthesia

## 2014-07-21 NOTE — Discharge Instructions (Signed)

## 2014-07-21 NOTE — Interval H&P Note (Signed)
History and Physical Interval Note:no change in H and P  07/21/2014 8:58 AM  Teresa Collins  has presented today for surgery, with the diagnosis of LEFT RENAL MASS   The various methods of treatment have been discussed with the patient and family. After consideration of risks, benefits and other options for treatment, the patient has consented to  Procedure(s): LAPAROSCOPIC VENTRAL HERNIA (N/A) INSERTION OF MESH (N/A) ROBOTIC ASSISTED LEFT PARTIAL NEPHRECTOMY (Left) as a surgical intervention .  The patient's history has been reviewed, patient examined, no change in status, stable for surgery.  I have reviewed the patient's chart and labs.  Questions were answered to the patient's satisfaction.     [name] A

## 2014-07-21 NOTE — Addendum Note (Signed)
Addendum created 07/21/14 1730 by Peyton Najjar, MD   Modules edited: Orders

## 2014-07-21 NOTE — Transfer of Care (Signed)
Immediate Anesthesia Transfer of Care Note  Patient: Teresa Collins  Procedure(s) Performed: Procedure(s): INCISIONAL HERNIA REPAIR WITH MESH  (N/A) INSERTION OF MESH (N/A) ROBOTIC ASSISTED LEFT PARTIAL NEPHRECTOMY (Left)  Patient Location: PACU  Anesthesia Type:General  Level of Consciousness: awake, sedated and patient cooperative  Airway & Oxygen Therapy: Patient Spontanous Breathing and Patient connected to face mask oxygen  Post-op Assessment: Report given to PACU RN and Post -op Vital signs reviewed and stable  Post vital signs: Reviewed and stable  Complications: No apparent anesthesia complications

## 2014-07-21 NOTE — Op Note (Signed)
Preoperative diagnosis:  1. Left renal mass   Postoperative diagnosis:  1. same   Procedure: 1. Robotic assisted laparoscopic left partial nephrectomy  Surgeon: Ardis Hughs, MD  Anesthesia: General  Complications: None  Intraoperative findings: The tumor was largely exophytic located on the posterior aspect of the left lower pole. The initial clamp time of the renal artery was 23 minutes. 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 As such clamps were read placed on the renal artery a second clamp time was 25 minutes. However, the margin status was negativ  in the base of the mass.  EBL:  150 mL   Specimens: None  Indication: Teresa Collins is a 63 y.o. patient with  incidental finding of a 3.5 cm left lower pole renal mass.  In addition, the patient was found to have a ventral hernia.  the patient elected to undergo simultaneous ventral hernia repair and robotic assisted laparoscopic left partial nephrectomy. After reviewing the management options for treatment, he elected to proceed with the above surgical procedure(s). We have discussed the potential benefits and risks of the procedure, side effects of the proposed treatment, the likelihood of the patient achieving the goals of the procedure, and any potential problems that might occur during the procedure or recuperation. Informed consent has been obtained.  Description of procedure:  The patient was taken to the operating room and general anesthesia was induced.  The patient first underwent a open lower midline ventral hernia repair by Dr. Ninfa Linden. Please see his dictation for details.  Once the ventral hernia repair was completed, the patient was then placed in the right lateral decubitus position, was then secured to the table with a beanbag, axillary roll placed on the right axilla and the lower extremities were built up with pillows. All bony prominences were padded and the  patient was taped at the chest, hips and knees. The table was then flexed so as to open up the costo-vertebral angle.   A Veress needle was then inserted through just lateral to the umbilicus and the abdomen inflated to 15 mmHg. A 12 mm Visiport trocar was then passed just lateral to the umbilicus using the 8 mm laparoscopic camera under direct visualization. We then placed the two 8 mm robotic ports under visual guidance. The first port is located just below the costal margin lateral to the xiphoid process and a second port placed in the right lower quadrant so as to triangulate onto the renal hilum. An 12 mm assistant port was placed below the camera port through the umbilicus. A third robotic port was then placed along the anterior axillary line between iliac crest and the 12th rib. The robot was then docked, in the left hand a bipolar fenestrated grasper was placed and in the right hand monopolar scissors were placed. The third robotic port was fenestrated grasper which was used to retract the kidney throughout the case.   I started the case by taking down the adhesions of the omentum off the anterior abdominal wall. The adhesions ran from the splenic flexure down to the pelvis. Once the adhesions were taken down I was able to dissect the descending colon medially down into the pelvis. The ureter was identified just medial to the psoas muscle, at the lower pole of the left kidney. Further dissection of the ureter from the underlying psoas allowed the ureter to be lifted up using the third robotic arm which allowed lift of the kidney  and facilitated dissection of the hilum. We then turned our attention to the renal hilum and dissected out the main renal vein. A vesi-loop was then passed around both of these vessels and Weck clip placed on the vessel loop. This was then used to retract the vein superiorly which helped facilitate the dissection of the renal artery. The renal artery was then dissected freely and  I was able to get a vessel loop around the base of the renal artery as well.   Once the hilum had been identified and secured attention was turned to the lower pole of the kidney. Gerotas fat was dissected off the lower pole of the kidney and around the mass. The renal mass was readily identified. The fat surrounding the mass was left on top of the mass and the remaining fat was dissected off of the surface. Once the fat had been dissected off the surface of the kidney around the lesion, the lesion was marked 360 around with cautery and used to delineate the margin around the mass. 12.5 mg of mannitol was administered. 2 bulldog clamps were then placed across the renal artery and the dissection ensued of the renal mass. This was done using the scissors sharply without cautery. Once the mass had been completely excised it was placed aside and the defect was closed in 2 layers. I then took a small segment at the base of the tumor and sent this for frozen section. The first layer was closed with a 3-0 suture in a running fashion within the base of the resection, closing the collecting system and vasculature. I used a second 3-0 suture given the size of the defect to close the collecting system as well as the renal bed. The ends of the V-loc were secured to either end of the defect with a Weck clip. The second layer was closed with a 2-0 V-loc suture in an interrupted fashion reapproximating the edges and creating tension by cinching the sutures down with Weck clips.  Once the second layer of suture had been completed the bulldogs released the renal artery there was no secondary or ongoing bleeding.  An additional 12.5mg  of mannitol was given once the clamps were removed. FloSeal was then squirted into the region of the resected mass and the remaining gerota's fat was then stretched over the defect and secured with Weck clips. The mass was then placed in an Endo Catch bag. The string from the Endo Catch bag was then  pulled through the assistant port. The Vesseloops were then removed from the patient. A drain was then passed through the third port and positioned around the resected area. 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 We then repositioned the kidney replaced the bulldog clamps on the artery and cut the renorphaphy stitches. I then proceeded to re\re resect the base of the tumor down to the collecting system. Again a frozen specimen was sent from the base of this resection and sent to pathology. The collecting system and renal parenchyma were then closed again with 3-0 V-lock suture and closed in 2 layers. A third layer was then used to reapproximate the edges of the resection bed. Hemoclips were used to create tension on the suture and get the ends together. The block labs were then once again removed and hemostasis was noted to be excellent. FloSeal was then again reapplied on the defect. The resection of the tumor base was then placed in Endo Catch bag and  removed through the 12 mm assistant port. The posterior peritoneum and perirenal fat were then reapproximated over the kidney and clipped together  Hemo-lock clips.  Then using a spinal needle we injected the patient along the anterior axillary line in the plane in between the transversus abdominis and the internal oblique muscles in the region of the musculocutaneous nervesand with a total of 10 cc of 2.5% ropivacaine in for separate injections. This was done under direct vision using the robotic camera. A 19 [place] Blake drain was then brought through the third robotic port located in the left lower quadrant the anterior axillary line. This was then secured to the skin with a drain stitch.  The ports were then all removed. The specimen was removed through the camera port without a significant need for dilating the fascia. Once the specimen was removed the fascia was then approximated with 0 Vicryl in 2  interrupted stitches. All skin incisions were then closed with a 4-0 Monocryl in a subcutaneous fashion. The drain was secured with a 3-0 nylon. Dermabond was then applied to the incisions. The patient was subsequently extubated and returned to the PACU in excellent condition. All laps, needles and sponges were accounted for at the end of case.   Ardis Hughs, M.D.   Ardis Hughs, M.D.

## 2014-07-21 NOTE — Op Note (Signed)
VENTRAL HERNIA REPAIR , INSERTION OF MESH  Procedure Note  Teresa Collins 07/21/2014   Pre-op Diagnosis: INCISIONAL HERNIA     Post-op Diagnosis: SAME  Procedure(s): INCISIONAL HERNIA REPAIR WITH MESH  Surgeon:  Dr. Coralie Keens Anesthesia: General  Staff:  Circulator: Orson Ape, RN; Timothy Lasso, RN Relief Scrub: Kipp Laurence, RN Scrub Person: [facility] [place], [place]  Estimated Blood Loss: Minimal                         Teresa Collins A   Date: 07/21/2014  Time: 12:07 PM

## 2014-07-21 NOTE — Progress Notes (Signed)
Sacral dressing not placed. Ventral Hernia repair less than 2 hours. Patient will be lateral for partial nephrectomy.

## 2014-07-21 NOTE — Anesthesia Postprocedure Evaluation (Signed)
  Anesthesia Post-op Note  Patient: Teresa Collins  Procedure(s) Performed: Procedure(s) (LRB): INCISIONAL HERNIA REPAIR WITH MESH  (N/A) INSERTION OF MESH (N/A) ROBOTIC ASSISTED LEFT PARTIAL NEPHRECTOMY (Left)  Patient Location: PACU  Anesthesia Type: General  Level of Consciousness: awake and alert   Airway and Oxygen Therapy: Patient Spontanous Breathing  Post-op Pain: mild  Post-op Assessment: Post-op Vital signs reviewed, Patient's Cardiovascular Status Stable, Respiratory Function Stable, Patent Airway and No signs of Nausea or vomiting  Last Vitals:  Filed Vitals:   07/21/14 1715  BP: 142/70  Pulse: 61  Temp:   Resp: 18    Post-op Vital Signs: stable   Complications: No apparent anesthesia complications

## 2014-07-22 ENCOUNTER — Encounter (HOSPITAL_COMMUNITY): Payer: Self-pay | Admitting: Surgery

## 2014-07-22 ENCOUNTER — Encounter: Payer: Self-pay | Admitting: Gastroenterology

## 2014-07-22 LAB — CBC
HEMATOCRIT: 33.9 % — AB (ref 36.0–46.0)
HEMOGLOBIN: 11.6 g/dL — AB (ref 12.0–15.0)
MCH: 30.4 pg (ref 26.0–34.0)
MCHC: 34.2 g/dL (ref 30.0–36.0)
MCV: 88.7 fL (ref 78.0–100.0)
Platelets: 138 10*3/uL — ABNORMAL LOW (ref 150–400)
RBC: 3.82 MIL/uL — ABNORMAL LOW (ref 3.87–5.11)
RDW: 13.6 % (ref 11.5–15.5)
WBC: 9 10*3/uL (ref 4.0–10.5)

## 2014-07-22 LAB — BASIC METABOLIC PANEL
Anion gap: 10 (ref 5–15)
BUN: 15 mg/dL (ref 6–23)
CO2: 29 mEq/L (ref 19–32)
CREATININE: 1.29 mg/dL — AB (ref 0.50–1.10)
Calcium: 9.7 mg/dL (ref 8.4–10.5)
Chloride: 102 mEq/L (ref 96–112)
GFR calc Af Amer: 50 mL/min — ABNORMAL LOW (ref >90)
GFR calc non Af Amer: 43 mL/min — ABNORMAL LOW (ref >90)
GLUCOSE: 177 mg/dL — AB (ref 70–99)
POTASSIUM: 4.5 meq/L (ref 3.7–5.3)
Sodium: 141 mEq/L (ref 137–147)

## 2014-07-22 MED ORDER — ACETAMINOPHEN 500 MG PO TABS
1000.0000 mg | ORAL_TABLET | Freq: Four times a day (QID) | ORAL | Status: AC
  Administered 2014-07-22: 1000 mg via ORAL
  Filled 2014-07-22 (×2): qty 2

## 2014-07-22 MED ORDER — ACETAMINOPHEN 10 MG/ML IV SOLN
1000.0000 mg | Freq: Four times a day (QID) | INTRAVENOUS | Status: DC
  Administered 2014-07-22 (×2): 1000 mg via INTRAVENOUS
  Filled 2014-07-22 (×4): qty 100

## 2014-07-22 NOTE — Progress Notes (Addendum)
Pharmacy Brief Note  This patient is receiving acetaminophen IV. Based on criteria approved by the Pharmacy and Therapeutics Committee, this medication is being converted to the equivalent oral dose form. These criteria include:   Marland Kitchen Scheduled IV acetaminophen will be converted to the same dosage PO as soon as the patient has taken an oral or enteral medication and will be continued through the duration of the original order. . This patient has no evidence of active gastrointestinal bleeding or impaired GI absorption (gastrectomy, short bowel, patient on TNA or NPO).   Acetaminophen to be changed to 1gm PO QID x 2 remaining doses  If you have questions about this conversion, please contact the pharmacy department (ext [phone]).  Doreene Eland, PharmD, BCPS.   Pager: [phone]  07/22/2014 11:07 AM

## 2014-07-22 NOTE — Op Note (Signed)
NAMEKAYANI, RAPAPORT NO.:  [id number]  MEDICAL RECORD NO.:  [id number]  LOCATION:  6834                         FACILITY:  [facility]  PHYSICIAN:  Coralie Keens, M.D. DATE OF BIRTH:  [birth year]  DATE OF PROCEDURE:  07/21/2014 DATE OF DISCHARGE:                              OPERATIVE REPORT   PREOPERATIVE DIAGNOSIS:  Incisional hernia.  POSTOPERATIVE DIAGNOSIS:  Incisional hernia.  PROCEDURE:  Incisional hernia repair with mesh.  SURGEON:  Coralie Keens, MD  ANESTHESIA:  General.  ESTIMATED BLOOD LOSS:  Minimal.  INDICATIONS:  This is a 63 year old female who presented initially to the emergency department at [facility] with abdominal pain.  She underwent a CAT scan of the abdomen and pelvis which demonstrated an incarcerated but nonobstructed loops of small bowel and a hernia below the umbilicus.  She was also found to have a renal mass.  As she was asymptomatic, she was discharged from the ER and sent for surgical consultation.  When I evaluated her, I could palpate the hernia defect. She had a nontender abdomen.  She was sent to Urology to evaluate her renal mass and determine that a partial nephrectomy was necessary.  She now presents to the operating room for repair of the hernia as well as partial nephrectomy.  FINDINGS:  The patient was found to have a very small fascial defect below the umbilicus.  There was no bowel involved in the hernia at the time of surgery.  It was repaired with a 6.4-cm round ventral patch.  PROCEDURE IN DETAILS:  The patient was brought to the operating room and identified as Teresa Collins.  She was placed supine on the operating room table and general anesthesia was induced.  Her abdomen was then prepped and draped in usual sterile fashion.  I made a vertical incision below the umbilicus.  I carried this down to the fascia.  I could not find the actual fascial defect initially despite totally dissecting  this fascia free circumferentially.  There was a moderate amount of scarring. I then made an opening in the fascia and then to the peritoneum under direct vision.  After palpating the peritoneal surface circumferentially I could find the fascia defect which was less than a centimeter above where I made my incision.  I thus connected the fascial defect with my current incision.  I then was able to find a small hernia sac which I excised.  There was omentum stuck to the abdominal wall which I took down with the cautery.  There was no bowel involved in the hernia at this time.  I then palpated the peritoneal surface with my finger circumferentially through the fascial opening and found no other fascial defects other than a mild weak area at the umbilicus.  At this point, a 6.4-cm round ventral patch was brought to the field.  I placed it through the fascial opening and then pulled it up against the peritoneum with stay tie.  I then sewed it in place circumferentially with interrupted #1 Novafil sutures.  I then cut the stay ties and closed the fascia over the top with 2 separate figure-of-eight #1 Novafil sutures. Good coverage of the fascial defect appeared to be  achieved.  I then closed the patient's subcutaneous tissue with interrupted 3-0 Vicryl sutures and closed the skin with a running 4-0 Monocryl.  Dermabond was then applied.  The patient tolerated this part of the procedure well.  At this point, Dr. Louis Meckel of Urology presented into the room to begin his portion of the procedure.     Coralie Keens, M.D.     DB/MEDQ  D:  07/21/2014  T:  07/22/2014  Job:  [id number]

## 2014-07-22 NOTE — Progress Notes (Signed)
Nutrition Brief Note  Patient identified on the Malnutrition Screening Tool (MST) Report  Wt Readings from Last 15 Encounters:  07/21/14 180 lb (81.647 kg)  07/21/14 180 lb (81.647 kg)  06/11/14 183 lb (83.008 kg)  05/28/14 183 lb (83.008 kg)  05/27/14 184 lb (83.462 kg)  05/05/14 180 lb (81.647 kg)  02/12/14 186 lb (84.369 kg)  01/05/14 196 lb (88.905 kg)  12/22/13 196 lb 3.2 oz (88.996 kg)  08/13/13 204 lb 8 oz (92.761 kg)  03/31/13 196 lb 1.3 oz (88.941 kg)  12/09/12 203 lb (92.08 kg)  09/16/12 197 lb (89.359 kg)  05/20/12 199 lb 8 oz (90.493 kg)  03/12/12 202 lb (91.627 kg)    Body mass index is 34.03 kg/(m^2). Patient meets criteria for Obesity I based on current BMI.   Current diet order is Regular, patient is consuming approximately 50% of meals at this time. Labs and medications reviewed.   Pt reported good appetite pta, has had abd pain for past several weeks; however has been able to eat normal meals. Was on clear liquid diet since 9/29, and diet was advanced to Regular this morning on 10/01. Tolerating with minor abd discomfort.  Has had intentional and unintentional weight loss of 16 lb since 12/2013 (9% body weight loss, non significant for time frame). Encouraged intake of fiber post ventral hernia w/mesh repair to avoid straining with bowel movements.   No nutrition interventions warranted at this time. If nutrition issues arise, please consult RD.   Atlee Abide MS RD LDN Clinical Dietitian [phone]

## 2014-07-22 NOTE — Progress Notes (Signed)
1 Day Post-Op Subjective: No events overnight. OOB and ambulating. Pain controlled. No nausea/vomiting. Tolerating clears. No bowel function yet.  Objective: Vital signs in last 24 hours: Temp:  [97.1 F (36.2 C)-98.1 F (36.7 C)] 98.1 F (36.7 C) (10/01 0457) Pulse Rate:  [52-72] 62 (10/01 0457) Resp:  [12-19] 18 (10/01 0457) BP: (129-147)/(63-81) 129/66 mmHg (10/01 0457) SpO2:  [100 %] 100 % (10/01 0457)  Intake/Output from previous day: 09/30 0701 - 10/01 0700 In: 4318.8 [I.V.:4118.8; IV Piggyback:200] Out: 1430 [Urine:920; Drains:360; Blood:150] Intake/Output this shift:    Physical Exam:  General: Alert and oriented CV: RRR Lungs: Clear Abdomen: Soft, ND Incisions: C/D/I. Drain serosanguinous.  GU: Foley clear pink Ext: NT, No erythema  Lab Results:  Recent Labs  07/19/14 1400 07/21/14 1656 07/22/14 0423  HGB 13.0 11.7* 11.6*  HCT 38.2 34.6* 33.9*   BMET  Recent Labs  07/19/14 1400 07/22/14 0423  NA 140 141  K 4.2 4.5  CL 101 102  CO2 27 29  GLUCOSE 103* 177*  BUN 19 15  CREATININE 1.09 1.29*  CALCIUM 10.6* 9.7     Studies/Results: No results found.  Assessment/Plan: POD#1 robotic left partial nephrectomy and ventral hernia repair with mesh. Doing well.  -- encourage PO pain meds today -- Takes pitavastatin at hom. Not on hospital formulary and allergic to lipitor, which is hospital alternative -- home nebivolol -- holding home ace-I due to renal ischemia from clamping to minimize ATN. BP stable for now -- Medlock, advance to regular diet -- Cr incr to 1.29 from baseline of 1.09 as expected. Continue to monitor -- H/H stable.  -- Bowel regimen with colace and senna -- Adequate UOP. Drain output slightly high overnight, but slowing. Continue to monitor with foley in today. Will likely d/c tomorrow. If JP output continues to be high, well send for JP Cr. -- SCDs, no pharmacologic ppx due to risk of bleeding. Patient is ambulatory -- Dispo:  floor, likely home tomorrow   LOS: 1 day   Sherlock Nancarrow C 07/22/2014, 8:57 AM

## 2014-07-23 LAB — BASIC METABOLIC PANEL
Anion gap: 9 (ref 5–15)
BUN: 16 mg/dL (ref 6–23)
CALCIUM: 9.2 mg/dL (ref 8.4–10.5)
CO2: 28 meq/L (ref 19–32)
Chloride: 99 mEq/L (ref 96–112)
Creatinine, Ser: 1.23 mg/dL — ABNORMAL HIGH (ref 0.50–1.10)
GFR calc Af Amer: 53 mL/min — ABNORMAL LOW (ref >90)
GFR calc non Af Amer: 46 mL/min — ABNORMAL LOW (ref >90)
Glucose, Bld: 120 mg/dL — ABNORMAL HIGH (ref 70–99)
Potassium: 4 mEq/L (ref 3.7–5.3)
Sodium: 136 mEq/L — ABNORMAL LOW (ref 137–147)

## 2014-07-23 LAB — CBC
HCT: 29.2 % — ABNORMAL LOW (ref 36.0–46.0)
HEMOGLOBIN: 10.2 g/dL — AB (ref 12.0–15.0)
MCH: 30.4 pg (ref 26.0–34.0)
MCHC: 34.9 g/dL (ref 30.0–36.0)
MCV: 87.2 fL (ref 78.0–100.0)
Platelets: 124 10*3/uL — ABNORMAL LOW (ref 150–400)
RBC: 3.35 MIL/uL — AB (ref 3.87–5.11)
RDW: 13.6 % (ref 11.5–15.5)
WBC: 6.5 10*3/uL (ref 4.0–10.5)

## 2014-07-23 LAB — HEMOGLOBIN AND HEMATOCRIT, BLOOD
HCT: 29.7 % — ABNORMAL LOW (ref 36.0–46.0)
Hemoglobin: 10.2 g/dL — ABNORMAL LOW (ref 12.0–15.0)

## 2014-07-23 LAB — CREATININE, FLUID (PLEURAL, PERITONEAL, JP DRAINAGE): Creat, Fluid: 1.2 mg/dL

## 2014-07-23 NOTE — Discharge Summary (Signed)
Physician Discharge Summary  Patient ID: Teresa Collins MRN: [id number] DOB/AGE: 1951/04/07 63 y.o.  Admit date: 07/21/2014 Discharge date: 07/23/2014  Admission Diagnoses:  Discharge Diagnoses:  Active Problems:   Renal mass, left   Discharged Condition: stable  Hospital Course: Patient underwent left robotic partial nephrectomy and ventral hernia repair on 07/21/14. She tolerated this well and recovered without complication on the floor. Her pain was controlled on PO pain meds and her diet was advanced to regular. She was able to void without difficulty after her foley was removed on POD#2. Her drain output remained low so this was removed. She was passing gas and ambulating without difficulty. She was discharged on POD#2.   Consults: None  Treatments: surgery: ventral hernia repair with mesh (General surgery), left robotic partial nephrectomy (urology)  Discharge Exam: Blood pressure 112/58, pulse 74, temperature 98.6 F (37 C), temperature source Oral, resp. rate 18, height 5\' 1"  (1.549 m), weight 81.647 kg (180 lb), SpO2 97.00%. AAOx3, in NAD, normal WOB. Incisions C/D/I. Abdomen soft, NT/ND. Foley clear pink. Extremities WWP, no edema   Disposition: 01-Home or Self Care     Medication List    TAKE these medications       docusate sodium 100 MG capsule  Commonly known as:  COLACE  Take 1 capsule (100 mg total) by mouth 2 (two) times daily.      ASK your doctor about these medications       carvedilol 6.25 MG tablet  Commonly known as:  COREG  Take 1 tablet (6.25 mg total) by mouth 2 (two) times daily with a meal.     CENTRUM ULTRA WOMENS Tabs  Take 1 tablet by mouth daily.     HYDROcodone-acetaminophen 5-325 MG per tablet  Commonly known as:  NORCO/VICODIN  Take 1 tablet by mouth every 4 (four) hours as needed for severe pain.  Ask about: Which instructions should I use?     HYDROcodone-acetaminophen 5-325 MG per tablet  Commonly known as:  NORCO  Take 1-2  tablets by mouth every 6 (six) hours as needed for moderate pain.  Ask about: Which instructions should I use?     LIVALO 2 MG Tabs  Generic drug:  Pitavastatin Calcium  Take 1 tablet by mouth at bedtime.     nebivolol 10 MG tablet  Commonly known as:  BYSTOLIC  Take 10 mg by mouth daily.     Vitamin D 2000 UNITS Caps  Take 2,000 Units by mouth daily.     VITAMIN E PO  Take 1 tablet by mouth daily.     ZESTORETIC 10-12.5 MG per tablet  Generic drug:  lisinopril-hydrochlorothiazide  Take 1 tablet by mouth every morning.         SignedWynetta Emery, Marnee Sherrard C 07/23/2014, 7:49 AM

## 2014-07-23 NOTE — Progress Notes (Signed)
D/C instructions reviewed w/ pt and husband. Both verbalize understanding, all questions answered, no further questions. Pt d/c in stable condition to husband's car by NT. Pt in possession of d/c instructions, scripts, and all personal belongigns.

## 2014-07-23 NOTE — Progress Notes (Signed)
Urology Inpatient Progress Report POD#2  S/p open ventral hernia repair by Dr. Ninfa Linden, RAL left partial nephrectomy  Intv/Subj: No acute events overnight. Patient is without complaint. Intermittent abdominal pain Foley out this AM Tolerating regular diet, passing gas, ambulating  Past Medical History  Diagnosis Date  . Hypertension   . GERD (gastroesophageal reflux disease)   . Eczema   . Hx of colonic polyps   . Hyperlipidemia   . Arthritis   . Incisional hernia   . Left renal mass   . Numbness and tingling     RT THIGH   Current Facility-Administered Medications  Medication Dose Route Frequency Provider Last Rate Last Dose  . cholecalciferol (VITAMIN D) tablet 2,000 Units  2,000 Units Oral Daily Milon Score, MD   2,000 Units at 07/22/14 1109  . diphenhydrAMINE (BENADRYL) injection 12.5-25 mg  12.5-25 mg Intravenous Q6H PRN Milon Score, MD       Or  . diphenhydrAMINE (BENADRYL) 12.5 MG/5ML elixir 12.5-25 mg  12.5-25 mg Oral Q6H PRN Milon Score, MD      . HYDROcodone-acetaminophen (NORCO/VICODIN) 5-325 MG per tablet 1-2 tablet  1-2 tablet Oral Q4H PRN Milon Score, MD   2 tablet at 07/23/14 [phone]  . morphine 2 MG/ML injection 2-4 mg  2-4 mg Intravenous Q2H PRN Milon Score, MD   4 mg at 07/23/14 0545  . nebivolol (BYSTOLIC) tablet 10 mg  10 mg Oral Daily Milon Score, MD   10 mg at 07/22/14 1109  . senna-docusate (Senokot-S) tablet 2 tablet  2 tablet Oral QHS Milon Score, MD   2 tablet at 07/22/14 2250     Objective: Vital: Filed Vitals:   07/22/14 1100 07/22/14 1533 07/22/14 2253 07/23/14 0537  BP: 143/69 121/56 99/52 112/58  Pulse: 55 52 64 74  Temp: 98 F (36.7 C) 98.7 F (37.1 C) 98.7 F (37.1 C) 98.6 F (37 C)  TempSrc: Oral Oral Oral Oral  Resp: 18 18 18 18   Height:      Weight:      SpO2: 100% 100% 94% 97%   I/Os: I/O last 3 completed shifts: In: 2728.8 [P.O.:960; I.V.:1568.8; IV Piggyback:200] Out: 3165 [Urine:2750;  Drains:415]  Physical Exam:  General: Patient is in no apparent distress Lungs: Normal respiratory effort, chest expands symmetrically. GI: incisions are c/d/i, appropriately tender, soft Ext: lower extremities symmetric  Lab Results:  Recent Labs  07/21/14 1656 07/22/14 0423 07/23/14 0403  WBC  --  9.0 6.5  HGB 11.7* 11.6* 10.2*  HCT 34.6* 33.9* 29.2*    Recent Labs  07/22/14 0423 07/23/14 0403  NA 141 136*  K 4.5 4.0  CL 102 99  CO2 29 28  GLUCOSE 177* 120*  BUN 15 16  CREATININE 1.29* 1.23*  CALCIUM 9.7 9.2   No results found for this basename: LABPT, INR,  in the last 72 hours No results found for this basename: LABURIN,  in the last 72 hours Results for orders placed during the hospital encounter of 07/19/14  URINE CULTURE     Status: None   Collection Time    07/19/14  1:30 PM      Result Value Ref Range Status   Specimen Description URINE, CLEAN CATCH   Final   Special Requests NONE   Final   Culture  Setup Time     Final   Value: 07/19/2014 21:31     Performed at [place]  Final   Value: NO GROWTH     Performed at [facility]   Culture     Final   Value: NO GROWTH     Performed at [facility]   Report Status 07/20/2014 FINAL   Final    Studies/Results:   Assessment: 2 Days Post-Op doing well  Plan: HLIVF Send JP for creatinine and d/c if able Repeat h/h at 11 [facility] to discharge patient around lunch time.  Louis Meckel W 07/23/2014, 7:30 AM

## 2014-07-29 NOTE — Discharge Summary (Signed)
I performed a history and physical examination of the patient and discussed his management with the resident.  I reviewed the resident's note and agree with the documented findings and plan of care.

## 2014-08-09 ENCOUNTER — Other Ambulatory Visit: Payer: Self-pay | Admitting: Internal Medicine

## 2014-08-10 ENCOUNTER — Other Ambulatory Visit: Payer: Self-pay

## 2014-08-10 MED ORDER — PITAVASTATIN CALCIUM 2 MG PO TABS: 1.0000 | ORAL_TABLET | Freq: Every day | ORAL | Status: DC

## 2014-08-13 ENCOUNTER — Encounter: Payer: Self-pay | Admitting: *Deleted

## 2014-08-13 NOTE — Telephone Encounter (Signed)
Patient would like to know if Dr Marcello Moores will write her a note excusing her from jury Duty?  Please advise

## 2014-08-13 NOTE — Telephone Encounter (Signed)
yes

## 2014-08-13 NOTE — Telephone Encounter (Signed)
Letter completed, signed and ready for pick up. PA started for lival[place].

## 2014-11-22 ENCOUNTER — Encounter: Payer: PRIVATE HEALTH INSURANCE | Admitting: Internal Medicine

## 2014-12-03 ENCOUNTER — Other Ambulatory Visit: Payer: Self-pay | Admitting: Nurse Practitioner

## 2014-12-03 DIAGNOSIS — Z1231 Encounter for screening mammogram for malignant neoplasm of breast: Secondary | ICD-10-CM

## 2014-12-10 ENCOUNTER — Ambulatory Visit
Admission: RE | Admit: 2014-12-10 | Discharge: 2014-12-10 | Disposition: A | Discharge status: Home / Self Care | Payer: 59 | Source: Ambulatory Visit | Attending: Nurse Practitioner | Admitting: Nurse Practitioner

## 2014-12-10 DIAGNOSIS — Z1231 Encounter for screening mammogram for malignant neoplasm of breast: Secondary | ICD-10-CM

## 2014-12-15 ENCOUNTER — Other Ambulatory Visit (HOSPITAL_COMMUNITY): Payer: Self-pay | Admitting: Urology

## 2014-12-15 ENCOUNTER — Ambulatory Visit (HOSPITAL_COMMUNITY)
Admission: RE | Admit: 2014-12-15 | Discharge: 2014-12-15 | Disposition: A | Discharge status: Home / Self Care | Payer: 59 | Source: Ambulatory Visit | Attending: Urology | Admitting: Urology

## 2014-12-15 DIAGNOSIS — C642 Malignant neoplasm of left kidney, except renal pelvis: Secondary | ICD-10-CM

## 2014-12-15 DIAGNOSIS — M47894 Other spondylosis, thoracic region: Secondary | ICD-10-CM | POA: Insufficient documentation

## 2015-10-23 HISTORY — PX: CARPAL TUNNEL RELEASE: SHX101

## 2015-11-29 ENCOUNTER — Encounter: Payer: Self-pay | Admitting: Internal Medicine

## 2015-11-29 ENCOUNTER — Ambulatory Visit (INDEPENDENT_AMBULATORY_CARE_PROVIDER_SITE_OTHER)
Admission: RE | Admit: 2015-11-29 | Discharge: 2015-11-29 | Disposition: A | Discharge status: Home / Self Care | Payer: BLUE CROSS/BLUE SHIELD | Source: Ambulatory Visit | Attending: Internal Medicine | Admitting: Internal Medicine

## 2015-11-29 ENCOUNTER — Other Ambulatory Visit (INDEPENDENT_AMBULATORY_CARE_PROVIDER_SITE_OTHER): Payer: BLUE CROSS/BLUE SHIELD

## 2015-11-29 ENCOUNTER — Ambulatory Visit (INDEPENDENT_AMBULATORY_CARE_PROVIDER_SITE_OTHER): Payer: BLUE CROSS/BLUE SHIELD | Admitting: Internal Medicine

## 2015-11-29 VITALS — BP 138/82 | HR 87 | Temp 98.3°F | Resp 16 | Ht 61.0 in | Wt 188.0 lb

## 2015-11-29 DIAGNOSIS — I10 Essential (primary) hypertension: Secondary | ICD-10-CM | POA: Diagnosis not present

## 2015-11-29 DIAGNOSIS — M1612 Unilateral primary osteoarthritis, left hip: Secondary | ICD-10-CM

## 2015-11-29 DIAGNOSIS — Z0001 Encounter for general adult medical examination with abnormal findings: Secondary | ICD-10-CM | POA: Diagnosis not present

## 2015-11-29 DIAGNOSIS — M25552 Pain in left hip: Secondary | ICD-10-CM

## 2015-11-29 DIAGNOSIS — Z0000 Encounter for general adult medical examination without abnormal findings: Secondary | ICD-10-CM | POA: Diagnosis not present

## 2015-11-29 DIAGNOSIS — Z1231 Encounter for screening mammogram for malignant neoplasm of breast: Secondary | ICD-10-CM

## 2015-11-29 DIAGNOSIS — G5602 Carpal tunnel syndrome, left upper limb: Secondary | ICD-10-CM | POA: Diagnosis not present

## 2015-11-29 DIAGNOSIS — Z124 Encounter for screening for malignant neoplasm of cervix: Secondary | ICD-10-CM | POA: Insufficient documentation

## 2015-11-29 DIAGNOSIS — M25559 Pain in unspecified hip: Secondary | ICD-10-CM | POA: Insufficient documentation

## 2015-11-29 DIAGNOSIS — Z1159 Encounter for screening for other viral diseases: Secondary | ICD-10-CM

## 2015-11-29 DIAGNOSIS — L209 Atopic dermatitis, unspecified: Secondary | ICD-10-CM

## 2015-11-29 DIAGNOSIS — Z23 Encounter for immunization: Secondary | ICD-10-CM | POA: Diagnosis not present

## 2015-11-29 LAB — CBC WITH DIFFERENTIAL/PLATELET
BASOS ABS: 0 10*3/uL (ref 0.0–0.1)
Basophils Relative: 0.6 % (ref 0.0–3.0)
EOS ABS: 0.1 10*3/uL (ref 0.0–0.7)
Eosinophils Relative: 1.7 % (ref 0.0–5.0)
HEMATOCRIT: 38.8 % (ref 36.0–46.0)
HEMOGLOBIN: 12.9 g/dL (ref 12.0–15.0)
LYMPHS PCT: 28.6 % (ref 12.0–46.0)
Lymphs Abs: 1.7 10*3/uL (ref 0.7–4.0)
MCHC: 33.2 g/dL (ref 30.0–36.0)
MCV: 89.5 fl (ref 78.0–100.0)
MONOS PCT: 10.3 % (ref 3.0–12.0)
Monocytes Absolute: 0.6 10*3/uL (ref 0.1–1.0)
NEUTROS ABS: 3.4 10*3/uL (ref 1.4–7.7)
Neutrophils Relative %: 58.8 % (ref 43.0–77.0)
PLATELETS: 159 10*3/uL (ref 150.0–400.0)
RBC: 4.34 Mil/uL (ref 3.87–5.11)
RDW: 13.9 % (ref 11.5–15.5)
WBC: 5.8 10*3/uL (ref 4.0–10.5)

## 2015-11-29 LAB — URINALYSIS, ROUTINE W REFLEX MICROSCOPIC
BILIRUBIN URINE: NEGATIVE
Hgb urine dipstick: NEGATIVE
KETONES UR: NEGATIVE
LEUKOCYTES UA: NEGATIVE
NITRITE: NEGATIVE
RBC / HPF: NONE SEEN (ref 0–?)
SPECIFIC GRAVITY, URINE: 1.02 (ref 1.000–1.030)
Total Protein, Urine: NEGATIVE
URINE GLUCOSE: NEGATIVE
UROBILINOGEN UA: 0.2 (ref 0.0–1.0)
pH: 5.5 (ref 5.0–8.0)

## 2015-11-29 LAB — COMPREHENSIVE METABOLIC PANEL
ALBUMIN: 4.2 g/dL (ref 3.5–5.2)
ALK PHOS: 73 U/L (ref 39–117)
ALT: 13 U/L (ref 0–35)
AST: 18 U/L (ref 0–37)
BILIRUBIN TOTAL: 0.4 mg/dL (ref 0.2–1.2)
BUN: 25 mg/dL — ABNORMAL HIGH (ref 6–23)
CALCIUM: 10.4 mg/dL (ref 8.4–10.5)
CO2: 32 meq/L (ref 19–32)
CREATININE: 1.18 mg/dL (ref 0.40–1.20)
Chloride: 103 mEq/L (ref 96–112)
GFR: 59.43 mL/min — AB (ref >60.00)
Glucose, Bld: 96 mg/dL (ref 70–99)
Potassium: 4.3 mEq/L (ref 3.5–5.1)
Sodium: 138 mEq/L (ref 135–145)
Total Protein: 7.5 g/dL (ref 6.0–8.3)

## 2015-11-29 LAB — LIPID PANEL
CHOL/HDL RATIO: 4
Cholesterol: 204 mg/dL — ABNORMAL HIGH (ref 0–200)
HDL: 56.4 mg/dL (ref >39.00)
LDL Cholesterol: 130 mg/dL — ABNORMAL HIGH (ref 0–99)
NonHDL: 147.54
TRIGLYCERIDES: 88 mg/dL (ref 0.0–149.0)
VLDL: 17.6 mg/dL (ref 0.0–40.0)

## 2015-11-29 LAB — TSH: TSH: 0.32 u[IU]/mL — ABNORMAL LOW (ref 0.35–4.50)

## 2015-11-29 MED ORDER — TRIAMCINOLONE ACETONIDE 0.5 % EX CREA: 1.0000 "application " | TOPICAL_CREAM | Freq: Three times a day (TID) | CUTANEOUS | Status: DC

## 2015-11-29 MED ORDER — MELOXICAM 15 MG PO TABS: 15.0000 mg | ORAL_TABLET | Freq: Every day | ORAL | Status: DC

## 2015-11-29 NOTE — Progress Notes (Signed)
Subjective:  Patient ID: Teresa Collins, female    DOB: 08-13-1951  Age: 65 y.o. MRN: [id number]  CC: Hypertension; Annual Exam; Hip Pain; and Rash   HPI Sana Keesee presents for a complete physical and follow-up on hypertension. She complains today of recurrent episodes of left hip pain over the last year. She denies any trauma or injury. She describes it as a throbbing, achy pain with spasms that radiates around her left upper hip and into her groin. The pain is exacerbated by activity and she is started to walk with a limp. She has not taken anything for the pain.  Her blood pressure has been well controlled at home on the combination of an ACE inhibitor and a diuretic.  Outpatient Prescriptions Prior to Visit  Medication Sig Dispense Refill  . Cholecalciferol (VITAMIN D) 2000 UNITS CAPS Take 2,000 Units by mouth daily.     Marland Kitchen lisinopril-hydrochlorothiazide (PRINZIDE,ZESTORETIC) 10-12.5 MG per tablet TAKE ONE TABLET BY MOUTH ONCE DAILY 90 tablet 1  . Multiple Vitamins-Minerals (CENTRUM ULTRA WOMENS) TABS Take 1 tablet by mouth daily.      Marland Kitchen VITAMIN E PO Take 1 tablet by mouth daily.     Marland Kitchen lisinopril-hydrochlorothiazide (ZESTORETIC) 10-12.5 MG per tablet Take 1 tablet by mouth every morning.     . docusate sodium (COLACE) 100 MG capsule Take 1 capsule (100 mg total) by mouth 2 (two) times daily. 60 capsule 0  . HYDROcodone-acetaminophen (NORCO) 5-325 MG per tablet Take 1-2 tablets by mouth every 6 (six) hours as needed for moderate pain. 45 tablet 0  . HYDROcodone-acetaminophen (NORCO/VICODIN) 5-325 MG per tablet Take 1 tablet by mouth every 4 (four) hours as needed for severe pain. 65 tablet 0  . nebivolol (BYSTOLIC) 10 MG tablet Take 10 mg by mouth daily.    . Pitavastatin Calcium (LIVALO) 2 MG TABS Take 1 tablet (2 mg total) by mouth at bedtime. 30 tablet 5   No facility-administered medications prior to visit.    ROS Review of Systems  Constitutional: Negative.  Negative  for fever, chills, diaphoresis, appetite change and fatigue.  HENT: Negative.  Negative for sore throat and trouble swallowing.   Eyes: Negative.   Respiratory: Negative.  Negative for cough, choking, chest tightness, shortness of breath and stridor.   Cardiovascular: Negative.  Negative for chest pain, palpitations and leg swelling.  Gastrointestinal: Negative.  Negative for nausea, vomiting, abdominal pain, diarrhea, constipation and blood in stool.  Endocrine: Negative.   Genitourinary: Negative.  Negative for dysuria, urgency, hematuria, difficulty urinating and pelvic pain.  Musculoskeletal: Positive for arthralgias. Negative for myalgias, back pain, joint swelling, neck pain and neck stiffness.  Skin: Positive for rash. Negative for color change.       She has recurrent episodes of an itchy rash on both lower extremities  Allergic/Immunologic: Negative.   Neurological: Negative.  Negative for dizziness, tremors, weakness, light-headedness and numbness.  Hematological: Negative.  Negative for adenopathy. Does not bruise/bleed easily.  Psychiatric/Behavioral: Negative.     Objective:  BP 138/82 mmHg  Pulse 87  Temp(Src) 98.3 F (36.8 C) (Oral)  Resp 16  Ht 5\' 1"  (1.549 m)  Wt 188 lb (85.276 kg)  BMI 35.54 kg/m2  SpO2 97%  BP Readings from Last 3 Encounters:  11/29/15 138/82  07/23/14 137/80  06/11/14 130/82    Wt Readings from Last 3 Encounters:  11/29/15 188 lb (85.276 kg)  07/21/14 180 lb (81.647 kg)  06/11/14 183 lb (83.008 kg)    Physical  Exam  Constitutional: She is oriented to person, place, and time. She appears well-developed and well-nourished. No distress.  HENT:  Head: Normocephalic and atraumatic.  Mouth/Throat: Oropharynx is clear and moist. No oropharyngeal exudate.  Eyes: Conjunctivae are normal. Right eye exhibits no discharge. Left eye exhibits no discharge. No scleral icterus.  Neck: Normal range of motion. Neck supple. No JVD present. No tracheal  deviation present. No thyromegaly present.  Cardiovascular: Normal rate, regular rhythm, normal heart sounds and intact distal pulses.  Exam reveals no gallop and no friction rub.   No murmur heard. Pulmonary/Chest: Effort normal and breath sounds normal. No stridor. No respiratory distress. She has no wheezes. She has no rales. She exhibits no tenderness.  Abdominal: Soft. Bowel sounds are normal. She exhibits no distension and no mass. There is no tenderness. There is no rebound and no guarding.  Musculoskeletal: Normal range of motion. She exhibits no edema or tenderness.       Left hip: Normal. She exhibits normal range of motion, normal strength, no tenderness, no bony tenderness, no swelling, no crepitus, no deformity and no laceration.  Lymphadenopathy:    She has no cervical adenopathy.  Neurological: She is oriented to person, place, and time. She has normal strength. She displays no atrophy and no tremor. No cranial nerve deficit or sensory deficit. She exhibits normal muscle tone. She displays a negative Romberg sign. She displays no seizure activity. Coordination and gait normal.  Skin: Skin is warm and dry. No rash noted. She is not diaphoretic. No erythema. No pallor.  On both lower extremities there are patches of scale and xerosis consistent with eczema  Psychiatric: She has a normal mood and affect. Her behavior is normal. Judgment and thought content normal.  Vitals reviewed.   Lab Results  Component Value Date   WBC 5.8 11/29/2015   HGB 12.9 11/29/2015   HCT 38.8 11/29/2015   PLT 159.0 11/29/2015   GLUCOSE 96 11/29/2015   CHOL 204* 11/29/2015   TRIG 88.0 11/29/2015   HDL 56.40 11/29/2015   LDLDIRECT 166.8 03/31/2013   LDLCALC 130* 11/29/2015   ALT 13 11/29/2015   AST 18 11/29/2015   NA 138 11/29/2015   K 4.3 11/29/2015   CL 103 11/29/2015   CREATININE 1.18 11/29/2015   BUN 25* 11/29/2015   CO2 32 11/29/2015   TSH 0.32* 11/29/2015   HGBA1C 5.5 02/12/2014     Dg Chest 2 View  12/15/2014  CLINICAL DATA:  Renal cell carcinoma left kidney EXAM: CHEST  2 VIEW COMPARISON:  07/06/2014 FINDINGS: Cardiomediastinal silhouette is stable. No acute infiltrate or pleural effusion. No pulmonary edema. Stable mild degenerative changes thoracic spine. IMPRESSION: No active cardiopulmonary disease. Stable degenerative changes thoracic spine. Electronically Signed   By: Lahoma Crocker M.D.   On: 12/15/2014 13:15    Assessment & Plan:   Caytlyn was seen today for hypertension, annual exam, hip pain and rash.  Diagnoses and all orders for this visit:  Routine general medical examination at a health care facility- her vaccines were reviewed and updated, she was referred for mammogram and a Pap smear, labs ordered and reviewed, exam completed, she was given patient education material. -     Lipid panel; Future -     Comprehensive metabolic panel; Future -     CBC with Differential/Platelet; Future -     TSH; Future -     Urinalysis, Routine w reflex microscopic (not at [facility]); Future -     Hepatitis  C antibody; Future -     HIV antibody; Future  Need for influenza vaccination -     Flu vaccine, recombinant, trivalent, inj (Flublok egg free)  Need for hepatitis C screening test  Carpal tunnel syndrome of left wrist  Hip pain, acute, left- the exam is unremarkable, plain films are positive for degenerative joint disease -     DG HIP UNILAT WITH PELVIS MIN 4 VIEWS LEFT; Future -     meloxicam (MOBIC) 15 MG tablet; Take 1 tablet (15 mg total) by mouth daily.  Cervical cancer screening -     Ambulatory referral to Gynecology  Visit for screening mammogram -     MM DIGITAL SCREENING BILATERAL; Future  Essential hypertension- her blood pressure is well-controlled, electrolytes and renal function are stable.  Atopic eczema -     triamcinolone cream (KENALOG) 0.5 %; Apply 1 application topically 3 (three) times daily.  Primary osteoarthritis of left hip - I  will treat with an anti-inflammatory -     meloxicam (MOBIC) 15 MG tablet; Take 1 tablet (15 mg total) by mouth daily.  I have discontinued Ms. Lenn's HYDROcodone-acetaminophen, nebivolol, HYDROcodone-acetaminophen, docusate sodium, and Pitavastatin Calcium. I am also having her start on triamcinolone cream and meloxicam. Additionally, I am having her maintain her CENTRUM ULTRA WOMENS, Vitamin D, VITAMIN E PO, and lisinopril-hydrochlorothiazide.  Meds ordered this encounter  Medications  . triamcinolone cream (KENALOG) 0.5 %    Sig: Apply 1 application topically 3 (three) times daily.    Dispense:  60 g    Refill:  3  . meloxicam (MOBIC) 15 MG tablet    Sig: Take 1 tablet (15 mg total) by mouth daily.    Dispense:  30 tablet    Refill:  5     Follow-up: Return in about 3 months (around 02/26/2016).  [name], MD

## 2015-11-29 NOTE — Patient Instructions (Signed)
Preventive Care for Adults, Female A healthy lifestyle and preventive care can promote health and wellness. Preventive health guidelines for women include the following key practices.  A routine yearly physical is a good way to check with your health care provider about your health and preventive screening. It is a chance to share any concerns and updates on your health and to receive a thorough exam.  Visit your dentist for a routine exam and preventive care every 6 months. Brush your teeth twice a day and floss once a day. Good oral hygiene prevents tooth decay and gum disease.  The frequency of eye exams is based on your age, health, family medical history, use of contact lenses, and other factors. Follow your health care provider's recommendations for frequency of eye exams.  Eat a healthy diet. Foods like vegetables, fruits, whole grains, low-fat dairy products, and lean protein foods contain the nutrients you need without too many calories. Decrease your intake of foods high in solid fats, added sugars, and salt. Eat the right amount of calories for you.Get information about a proper diet from your health care provider, if necessary.  Regular physical exercise is one of the most important things you can do for your health. Most adults should get at least 150 minutes of moderate-intensity exercise (any activity that increases your heart rate and causes you to sweat) each week. In addition, most adults need muscle-strengthening exercises on 2 or more days a week.  Maintain a healthy weight. The body mass index (BMI) is a screening tool to identify possible weight problems. It provides an estimate of body fat based on height and weight. Your health care provider can find your BMI and can help you achieve or maintain a healthy weight.For adults 20 years and older:  A BMI below 18.5 is considered underweight.  A BMI of 18.5 to 24.9 is normal.  A BMI of 25 to 29.9 is considered overweight.  A  BMI of 30 and above is considered obese.  Maintain normal blood lipids and cholesterol levels by exercising and minimizing your intake of saturated fat. Eat a balanced diet with plenty of fruit and vegetables. Blood tests for lipids and cholesterol should begin at age 45 and be repeated every 5 years. If your lipid or cholesterol levels are high, you are over 50, or you are at high risk for heart disease, you may need your cholesterol levels checked more frequently.Ongoing high lipid and cholesterol levels should be treated with medicines if diet and exercise are not working.  If you smoke, find out from your health care provider how to quit. If you do not use tobacco, do not start.  Lung cancer screening is recommended for adults aged 45-80 years who are at high risk for developing lung cancer because of a history of smoking. A yearly low-dose CT scan of the lungs is recommended for people who have at least a 30-pack-year history of smoking and are a current smoker or have quit within the past 15 years. A pack year of smoking is smoking an average of 1 pack of cigarettes a day for 1 year (for example: 1 pack a day for 30 years or 2 packs a day for 15 years). Yearly screening should continue until the smoker has stopped smoking for at least 15 years. Yearly screening should be stopped for people who develop a health problem that would prevent them from having lung cancer treatment.  If you are pregnant, do not drink alcohol. If you are  breastfeeding, be very cautious about drinking alcohol. If you are not pregnant and choose to drink alcohol, do not have more than 1 drink per day. One drink is considered to be 12 ounces (355 mL) of beer, 5 ounces (148 mL) of wine, or 1.5 ounces (44 mL) of liquor.  Avoid use of street drugs. Do not share needles with anyone. Ask for help if you need support or instructions about stopping the use of drugs.  High blood pressure causes heart disease and increases the risk  of stroke. Your blood pressure should be checked at least every 1 to 2 years. Ongoing high blood pressure should be treated with medicines if weight loss and exercise do not work.  If you are 55-79 years old, ask your health care provider if you should take aspirin to prevent strokes.  Diabetes screening is done by taking a blood sample to check your blood glucose level after you have not eaten for a certain period of time (fasting). If you are not overweight and you do not have risk factors for diabetes, you should be screened once every 3 years starting at age 45. If you are overweight or obese and you are 40-70 years of age, you should be screened for diabetes every year as part of your cardiovascular risk assessment.  Breast cancer screening is essential preventive care for women. You should practice "breast self-awareness." This means understanding the normal appearance and feel of your breasts and may include breast self-examination. Any changes detected, no matter how small, should be reported to a health care provider. Women in their 20s and 30s should have a clinical breast exam (CBE) by a health care provider as part of a regular health exam every 1 to 3 years. After age 40, women should have a CBE every year. Starting at age 40, women should consider having a mammogram (breast X-ray test) every year. Women who have a family history of breast cancer should talk to their health care provider about genetic screening. Women at a high risk of breast cancer should talk to their health care providers about having an MRI and a mammogram every year.  Breast cancer gene (BRCA)-related cancer risk assessment is recommended for women who have family members with BRCA-related cancers. BRCA-related cancers include breast, ovarian, tubal, and peritoneal cancers. Having family members with these cancers may be associated with an increased risk for harmful changes (mutations) in the breast cancer genes BRCA1 and  BRCA2. Results of the assessment will determine the need for genetic counseling and BRCA1 and BRCA2 testing.  Your health care provider may recommend that you be screened regularly for cancer of the pelvic organs (ovaries, uterus, and vagina). This screening involves a pelvic examination, including checking for microscopic changes to the surface of your cervix (Pap test). You may be encouraged to have this screening done every 3 years, beginning at age 21.  For women ages 30-65, health care providers may recommend pelvic exams and Pap testing every 3 years, or they may recommend the Pap and pelvic exam, combined with testing for human papilloma virus (HPV), every 5 years. Some types of HPV increase your risk of cervical cancer. Testing for HPV may also be done on women of any age with unclear Pap test results.  Other health care providers may not recommend any screening for nonpregnant women who are considered low risk for pelvic cancer and who do not have symptoms. Ask your health care provider if a screening pelvic exam is right for   you.  If you have had past treatment for cervical cancer or a condition that could lead to cancer, you need Pap tests and screening for cancer for at least 20 years after your treatment. If Pap tests have been discontinued, your risk factors (such as having a new sexual partner) need to be reassessed to determine if screening should resume. Some women have medical problems that increase the chance of getting cervical cancer. In these cases, your health care provider may recommend more frequent screening and Pap tests.  Colorectal cancer can be detected and often prevented. Most routine colorectal cancer screening begins at the age of 50 years and continues through age 75 years. However, your health care provider may recommend screening at an earlier age if you have risk factors for colon cancer. On a yearly basis, your health care provider may provide home test kits to check  for hidden blood in the stool. Use of a small camera at the end of a tube, to directly examine the colon (sigmoidoscopy or colonoscopy), can detect the earliest forms of colorectal cancer. Talk to your health care provider about this at age 50, when routine screening begins. Direct exam of the colon should be repeated every 5-10 years through age 75 years, unless early forms of precancerous polyps or small growths are found.  People who are at an increased risk for hepatitis B should be screened for this virus. You are considered at high risk for hepatitis B if:  You were born in a country where hepatitis B occurs often. Talk with your health care provider about which countries are considered high risk.  Your parents were born in a high-risk country and you have not received a shot to protect against hepatitis B (hepatitis B vaccine).  You have HIV or AIDS.  You use needles to inject street drugs.  You live with, or have sex with, someone who has hepatitis B.  You get hemodialysis treatment.  You take certain medicines for conditions like cancer, organ transplantation, and autoimmune conditions.  Hepatitis C blood testing is recommended for all people born from 1945 through 1965 and any individual with known risks for hepatitis C.  Practice safe sex. Use condoms and avoid high-risk sexual practices to reduce the spread of sexually transmitted infections (STIs). STIs include gonorrhea, chlamydia, syphilis, trichomonas, herpes, HPV, and human immunodeficiency virus (HIV). Herpes, HIV, and HPV are viral illnesses that have no cure. They can result in disability, cancer, and death.  You should be screened for sexually transmitted illnesses (STIs) including gonorrhea and chlamydia if:  You are sexually active and are younger than 24 years.  You are older than 24 years and your health care provider tells you that you are at risk for this type of infection.  Your sexual activity has changed  since you were last screened and you are at an increased risk for chlamydia or gonorrhea. Ask your health care provider if you are at risk.  If you are at risk of being infected with HIV, it is recommended that you take a prescription medicine daily to prevent HIV infection. This is called preexposure prophylaxis (PrEP). You are considered at risk if:  You are sexually active and do not regularly use condoms or know the HIV status of your partner(s).  You take drugs by injection.  You are sexually active with a partner who has HIV.  Talk with your health care provider about whether you are at high risk of being infected with HIV. If   you choose to begin PrEP, you should first be tested for HIV. You should then be tested every 3 months for as long as you are taking PrEP.  Osteoporosis is a disease in which the bones lose minerals and strength with aging. This can result in serious bone fractures or breaks. The risk of osteoporosis can be identified using a bone density scan. Women ages 67 years and over and women at risk for fractures or osteoporosis should discuss screening with their health care providers. Ask your health care provider whether you should take a calcium supplement or vitamin D to reduce the rate of osteoporosis.  Menopause can be associated with physical symptoms and risks. Hormone replacement therapy is available to decrease symptoms and risks. You should talk to your health care provider about whether hormone replacement therapy is right for you.  Use sunscreen. Apply sunscreen liberally and repeatedly throughout the day. You should seek shade when your shadow is shorter than you. Protect yourself by wearing long sleeves, pants, a wide-brimmed hat, and sunglasses year round, whenever you are outdoors.  Once a month, do a whole body skin exam, using a mirror to look at the skin on your back. Tell your health care provider of new moles, moles that have irregular borders, moles that  are larger than a pencil eraser, or moles that have changed in shape or color.  Stay current with required vaccines (immunizations).  Influenza vaccine. All adults should be immunized every year.  Tetanus, diphtheria, and acellular pertussis (Td, Tdap) vaccine. Pregnant women should receive 1 dose of Tdap vaccine during each pregnancy. The dose should be obtained regardless of the length of time since the last dose. Immunization is preferred during the 27th-36th week of gestation. An adult who has not previously received Tdap or who does not know her vaccine status should receive 1 dose of Tdap. This initial dose should be followed by tetanus and diphtheria toxoids (Td) booster doses every 10 years. Adults with an unknown or incomplete history of completing a 3-dose immunization series with Td-containing vaccines should begin or complete a primary immunization series including a Tdap dose. Adults should receive a Td booster every 10 years.  Varicella vaccine. An adult without evidence of immunity to varicella should receive 2 doses or a second dose if she has previously received 1 dose. Pregnant females who do not have evidence of immunity should receive the first dose after pregnancy. This first dose should be obtained before leaving the health care facility. The second dose should be obtained 4-8 weeks after the first dose.  Human papillomavirus (HPV) vaccine. Females aged 13-26 years who have not received the vaccine previously should obtain the 3-dose series. The vaccine is not recommended for use in pregnant females. However, pregnancy testing is not needed before receiving a dose. If a female is found to be pregnant after receiving a dose, no treatment is needed. In that case, the remaining doses should be delayed until after the pregnancy. Immunization is recommended for any person with an immunocompromised condition through the age of 61 years if she did not get any or all doses earlier. During the  3-dose series, the second dose should be obtained 4-8 weeks after the first dose. The third dose should be obtained 24 weeks after the first dose and 16 weeks after the second dose.  Zoster vaccine. One dose is recommended for adults aged 30 years or older unless certain conditions are present.  Measles, mumps, and rubella (MMR) vaccine. Adults born  before 1957 generally are considered immune to measles and mumps. Adults born in 1957 or later should have 1 or more doses of MMR vaccine unless there is a contraindication to the vaccine or there is laboratory evidence of immunity to each of the three diseases. A routine second dose of MMR vaccine should be obtained at least 28 days after the first dose for students attending postsecondary schools, health care workers, or international travelers. People who received inactivated measles vaccine or an unknown type of measles vaccine during 1963-1967 should receive 2 doses of MMR vaccine. People who received inactivated mumps vaccine or an unknown type of mumps vaccine before 1979 and are at high risk for mumps infection should consider immunization with 2 doses of MMR vaccine. For females of childbearing age, rubella immunity should be determined. If there is no evidence of immunity, females who are not pregnant should be vaccinated. If there is no evidence of immunity, females who are pregnant should delay immunization until after pregnancy. Unvaccinated health care workers born before 1957 who lack laboratory evidence of measles, mumps, or rubella immunity or laboratory confirmation of disease should consider measles and mumps immunization with 2 doses of MMR vaccine or rubella immunization with 1 dose of MMR vaccine.  Pneumococcal 13-valent conjugate (PCV13) vaccine. When indicated, a person who is uncertain of his immunization history and has no record of immunization should receive the PCV13 vaccine. All adults 65 years of age and older should receive this  vaccine. An adult aged 19 years or older who has certain medical conditions and has not been previously immunized should receive 1 dose of PCV13 vaccine. This PCV13 should be followed with a dose of pneumococcal polysaccharide (PPSV23) vaccine. Adults who are at high risk for pneumococcal disease should obtain the PPSV23 vaccine at least 8 weeks after the dose of PCV13 vaccine. Adults older than 65 years of age who have normal immune system function should obtain the PPSV23 vaccine dose at least 1 year after the dose of PCV13 vaccine.  Pneumococcal polysaccharide (PPSV23) vaccine. When PCV13 is also indicated, PCV13 should be obtained first. All adults aged 65 years and older should be immunized. An adult younger than age 65 years who has certain medical conditions should be immunized. Any person who resides in a nursing home or long-term care facility should be immunized. An adult smoker should be immunized. People with an immunocompromised condition and certain other conditions should receive both PCV13 and PPSV23 vaccines. People with human immunodeficiency virus (HIV) infection should be immunized as soon as possible after diagnosis. Immunization during chemotherapy or radiation therapy should be avoided. Routine use of PPSV23 vaccine is not recommended for American Indians, [place] Natives, or people younger than 65 years unless there are medical conditions that require PPSV23 vaccine. When indicated, people who have unknown immunization and have no record of immunization should receive PPSV23 vaccine. One-time revaccination 5 years after the first dose of PPSV23 is recommended for people aged 19-64 years who have chronic kidney failure, nephrotic syndrome, asplenia, or immunocompromised conditions. People who received 1-2 doses of PPSV23 before age 65 years should receive another dose of PPSV23 vaccine at age 65 years or later if at least 5 years have passed since the previous dose. Doses of PPSV23 are not  needed for people immunized with PPSV23 at or after age 65 years.  Meningococcal vaccine. Adults with asplenia or persistent complement component deficiencies should receive 2 doses of quadrivalent meningococcal conjugate (MenACWY-D) vaccine. The doses should be obtained   at least 2 months apart. Microbiologists working with certain meningococcal bacteria, [place] recruits, people at risk during an outbreak, and people who travel to or live in countries with a high rate of meningitis should be immunized. A first-year college student up through age 34 years who is living in a residence hall should receive a dose if she did not receive a dose on or after her 16th birthday. Adults who have certain high-risk conditions should receive one or more doses of vaccine.  Hepatitis A vaccine. Adults who wish to be protected from this disease, have certain high-risk conditions, work with hepatitis A-infected animals, work in hepatitis A research labs, or travel to or work in countries with a high rate of hepatitis A should be immunized. Adults who were previously unvaccinated and who anticipate close contact with an international adoptee during the first 60 days after arrival in the [place] from a country with a high rate of hepatitis A should be immunized.  Hepatitis B vaccine. Adults who wish to be protected from this disease, have certain high-risk conditions, may be exposed to blood or other infectious body fluids, are household contacts or sex partners of hepatitis B positive people, are clients or workers in certain care facilities, or travel to or work in countries with a high rate of hepatitis B should be immunized.  Haemophilus influenzae type b (Hib) vaccine. A previously unvaccinated person with asplenia or sickle cell disease or having a scheduled splenectomy should receive 1 dose of Hib vaccine. Regardless of previous immunization, a recipient of a hematopoietic stem cell transplant should receive a  3-dose series 6-12 months after her successful transplant. Hib vaccine is not recommended for adults with HIV infection. Preventive Services / Frequency Ages 35 to 4 years  Blood pressure check.** / Every 3-5 years.  Lipid and cholesterol check.** / Every 5 years beginning at age 60.  Clinical breast exam.** / Every 3 years for women in their 71s and 10s.  BRCA-related cancer risk assessment.** / For women who have family members with a BRCA-related cancer (breast, ovarian, tubal, or peritoneal cancers).  Pap test.** / Every 2 years from ages 76 through 26. Every 3 years starting at age 61 through age 76 or 93 with a history of 3 consecutive normal Pap tests.  HPV screening.** / Every 3 years from ages 37 through ages 60 to 51 with a history of 3 consecutive normal Pap tests.  Hepatitis C blood test.** / For any individual with known risks for hepatitis C.  Skin self-exam. / Monthly.  Influenza vaccine. / Every year.  Tetanus, diphtheria, and acellular pertussis (Tdap, Td) vaccine.** / Consult your health care provider. Pregnant women should receive 1 dose of Tdap vaccine during each pregnancy. 1 dose of Td every 10 years.  Varicella vaccine.** / Consult your health care provider. Pregnant females who do not have evidence of immunity should receive the first dose after pregnancy.  HPV vaccine. / 3 doses over 6 months, if 93 and younger. The vaccine is not recommended for use in pregnant females. However, pregnancy testing is not needed before receiving a dose.  Measles, mumps, rubella (MMR) vaccine.** / You need at least 1 dose of MMR if you were born in 1957 or later. You may also need a 2nd dose. For females of childbearing age, rubella immunity should be determined. If there is no evidence of immunity, females who are not pregnant should be vaccinated. If there is no evidence of immunity, females who are  pregnant should delay immunization until after pregnancy.  Pneumococcal  13-valent conjugate (PCV13) vaccine.** / Consult your health care provider.  Pneumococcal polysaccharide (PPSV23) vaccine.** / 1 to 2 doses if you smoke cigarettes or if you have certain conditions.  Meningococcal vaccine.** / 1 dose if you are age 68 to 8 years and a Market researcher living in a residence hall, or have one of several medical conditions, you need to get vaccinated against meningococcal disease. You may also need additional booster doses.  Hepatitis A vaccine.** / Consult your health care provider.  Hepatitis B vaccine.** / Consult your health care provider.  Haemophilus influenzae type b (Hib) vaccine.** / Consult your health care provider. Ages 7 to 53 years  Blood pressure check.** / Every year.  Lipid and cholesterol check.** / Every 5 years beginning at age 25 years.  Lung cancer screening. / Every year if you are aged 11-80 years and have a 30-pack-year history of smoking and currently smoke or have quit within the past 15 years. Yearly screening is stopped once you have quit smoking for at least 15 years or develop a health problem that would prevent you from having lung cancer treatment.  Clinical breast exam.** / Every year after age 48 years.  BRCA-related cancer risk assessment.** / For women who have family members with a BRCA-related cancer (breast, ovarian, tubal, or peritoneal cancers).  Mammogram.** / Every year beginning at age 41 years and continuing for as long as you are in good health. Consult with your health care provider.  Pap test.** / Every 3 years starting at age 65 years through age 37 or 70 years with a history of 3 consecutive normal Pap tests.  HPV screening.** / Every 3 years from ages 72 years through ages 60 to 40 years with a history of 3 consecutive normal Pap tests.  Fecal occult blood test (FOBT) of stool. / Every year beginning at age 21 years and continuing until age 5 years. You may not need to do this test if you get  a colonoscopy every 10 years.  Flexible sigmoidoscopy or colonoscopy.** / Every 5 years for a flexible sigmoidoscopy or every 10 years for a colonoscopy beginning at age 35 years and continuing until age 48 years.  Hepatitis C blood test.** / For all people born from [birth year] through 1965 and any individual with known risks for hepatitis C.  Skin self-exam. / Monthly.  Influenza vaccine. / Every year.  Tetanus, diphtheria, and acellular pertussis (Tdap/Td) vaccine.** / Consult your health care provider. Pregnant women should receive 1 dose of Tdap vaccine during each pregnancy. 1 dose of Td every 10 years.  Varicella vaccine.** / Consult your health care provider. Pregnant females who do not have evidence of immunity should receive the first dose after pregnancy.  Zoster vaccine.** / 1 dose for adults aged 30 years or older.  Measles, mumps, rubella (MMR) vaccine.** / You need at least 1 dose of MMR if you were born in 1957 or later. You may also need a second dose. For females of childbearing age, rubella immunity should be determined. If there is no evidence of immunity, females who are not pregnant should be vaccinated. If there is no evidence of immunity, females who are pregnant should delay immunization until after pregnancy.  Pneumococcal 13-valent conjugate (PCV13) vaccine.** / Consult your health care provider.  Pneumococcal polysaccharide (PPSV23) vaccine.** / 1 to 2 doses if you smoke cigarettes or if you have certain conditions.  Meningococcal vaccine.** /  Consult your health care provider.  Hepatitis A vaccine.** / Consult your health care provider.  Hepatitis B vaccine.** / Consult your health care provider.  Haemophilus influenzae type b (Hib) vaccine.** / Consult your health care provider. Ages 64 years and over  Blood pressure check.** / Every year.  Lipid and cholesterol check.** / Every 5 years beginning at age 23 years.  Lung cancer screening. / Every year if you  are aged 16-80 years and have a 30-pack-year history of smoking and currently smoke or have quit within the past 15 years. Yearly screening is stopped once you have quit smoking for at least 15 years or develop a health problem that would prevent you from having lung cancer treatment.  Clinical breast exam.** / Every year after age 74 years.  BRCA-related cancer risk assessment.** / For women who have family members with a BRCA-related cancer (breast, ovarian, tubal, or peritoneal cancers).  Mammogram.** / Every year beginning at age 44 years and continuing for as long as you are in good health. Consult with your health care provider.  Pap test.** / Every 3 years starting at age 58 years through age 22 or 39 years with 3 consecutive normal Pap tests. Testing can be stopped between 65 and 70 years with 3 consecutive normal Pap tests and no abnormal Pap or HPV tests in the past 10 years.  HPV screening.** / Every 3 years from ages 64 years through ages 70 or 61 years with a history of 3 consecutive normal Pap tests. Testing can be stopped between 65 and 70 years with 3 consecutive normal Pap tests and no abnormal Pap or HPV tests in the past 10 years.  Fecal occult blood test (FOBT) of stool. / Every year beginning at age 40 years and continuing until age 27 years. You may not need to do this test if you get a colonoscopy every 10 years.  Flexible sigmoidoscopy or colonoscopy.** / Every 5 years for a flexible sigmoidoscopy or every 10 years for a colonoscopy beginning at age 7 years and continuing until age 32 years.  Hepatitis C blood test.** / For all people born from [birth year] through 1964 and any individual with known risks for hepatitis C.  Osteoporosis screening.** / A one-time screening for women ages 30 years and over and women at risk for fractures or osteoporosis.  Skin self-exam. / Monthly.  Influenza vaccine. / Every year.  Tetanus, diphtheria, and acellular pertussis (Tdap/Td)  vaccine.** / 1 dose of Td every 10 years.  Varicella vaccine.** / Consult your health care provider.  Zoster vaccine.** / 1 dose for adults aged 35 years or older.  Pneumococcal 13-valent conjugate (PCV13) vaccine.** / Consult your health care provider.  Pneumococcal polysaccharide (PPSV23) vaccine.** / 1 dose for all adults aged 46 years and older.  Meningococcal vaccine.** / Consult your health care provider.  Hepatitis A vaccine.** / Consult your health care provider.  Hepatitis B vaccine.** / Consult your health care provider.  Haemophilus influenzae type b (Hib) vaccine.** / Consult your health care provider. ** Family history and personal history of risk and conditions may change your health care provider's recommendations.   This information is not intended to replace advice given to you by your health care provider. Make sure you discuss any questions you have with your health care provider.   Document Released: 12/04/2001 Document Revised: 10/29/2014 Document Reviewed: 03/05/2011 Elsevier Interactive Patient Education [employer].

## 2015-11-29 NOTE — Progress Notes (Signed)
Pre visit review using our clinic review tool, if applicable. No additional management support is needed unless otherwise documented below in the visit note. 

## 2015-11-30 ENCOUNTER — Encounter: Payer: Self-pay | Admitting: Internal Medicine

## 2015-11-30 LAB — HIV ANTIBODY (ROUTINE TESTING W REFLEX): HIV: NONREACTIVE

## 2015-11-30 LAB — HEPATITIS C ANTIBODY: HCV Ab: NEGATIVE

## 2015-12-01 ENCOUNTER — Telehealth: Payer: Self-pay | Admitting: Internal Medicine

## 2015-12-01 NOTE — Telephone Encounter (Signed)
Please call patient regasrding lab results.

## 2015-12-02 NOTE — Telephone Encounter (Signed)
Schedule f/u for Tuesday

## 2015-12-06 ENCOUNTER — Other Ambulatory Visit (INDEPENDENT_AMBULATORY_CARE_PROVIDER_SITE_OTHER): Payer: BLUE CROSS/BLUE SHIELD

## 2015-12-06 ENCOUNTER — Ambulatory Visit (INDEPENDENT_AMBULATORY_CARE_PROVIDER_SITE_OTHER): Payer: BLUE CROSS/BLUE SHIELD | Admitting: Internal Medicine

## 2015-12-06 ENCOUNTER — Encounter: Payer: Self-pay | Admitting: Internal Medicine

## 2015-12-06 VITALS — BP 144/82 | HR 60 | Temp 97.8°F | Resp 16 | Ht 61.0 in | Wt 186.0 lb

## 2015-12-06 DIAGNOSIS — R946 Abnormal results of thyroid function studies: Secondary | ICD-10-CM | POA: Diagnosis not present

## 2015-12-06 DIAGNOSIS — R7989 Other specified abnormal findings of blood chemistry: Secondary | ICD-10-CM

## 2015-12-06 DIAGNOSIS — K591 Functional diarrhea: Secondary | ICD-10-CM | POA: Diagnosis not present

## 2015-12-06 DIAGNOSIS — G5602 Carpal tunnel syndrome, left upper limb: Secondary | ICD-10-CM

## 2015-12-06 DIAGNOSIS — K589 Irritable bowel syndrome without diarrhea: Secondary | ICD-10-CM | POA: Diagnosis not present

## 2015-12-06 LAB — SEDIMENTATION RATE: Sed Rate: 2 mm/hr (ref 0–22)

## 2015-12-06 LAB — TSH: TSH: 0.4 u[IU]/mL (ref 0.35–4.50)

## 2015-12-06 LAB — T4, FREE: Free T4: 1.06 ng/dL (ref 0.60–1.60)

## 2015-12-06 LAB — C-REACTIVE PROTEIN: CRP: 1 mg/dL (ref 0.5–20.0)

## 2015-12-06 LAB — T3: T3, Total: 73 ng/dL — ABNORMAL LOW (ref 76–181)

## 2015-12-06 LAB — T4: T4 TOTAL: 8.3 ug/dL (ref 4.5–12.0)

## 2015-12-06 MED ORDER — ELUXADOLINE 75 MG PO TABS: 1.0000 | ORAL_TABLET | Freq: Two times a day (BID) | ORAL | Status: DC

## 2015-12-06 MED ORDER — LISINOPRIL-HYDROCHLOROTHIAZIDE 10-12.5 MG PO TABS: 1.0000 | ORAL_TABLET | Freq: Every day | ORAL | Status: DC

## 2015-12-06 NOTE — Patient Instructions (Signed)
Irritable Bowel Syndrome, Adult Irritable bowel syndrome (IBS) is not one specific disease. It is a group of symptoms that affects the organs responsible for digestion (gastrointestinal or GI tract).  To regulate how your GI tract works, your body sends signals back and forth between your intestines and your brain. If you have IBS, there may be a problem with these signals. As a result, your GI tract does not function normally. Your intestines may become more sensitive and overreact to certain things. This is especially true when you eat certain foods or when you are under stress.  There are four types of IBS. These may be determined based on the consistency of your stool:   IBS with diarrhea.   IBS with constipation.   Mixed IBS.   Unsubtyped IBS.  It is important to know which type of IBS you have. Some treatments are more likely to be helpful for certain types of IBS.  CAUSES  The exact cause of IBS is not known. RISK FACTORS You may have a higher risk of IBS if:  You are a woman.  You are younger than 64 years old.  You have a family history of IBS.  You have mental health problems.  You have had bacterial infection of your GI tract. SIGNS AND SYMPTOMS  Symptoms of IBS vary from person to person. The main symptom is abdominal pain or discomfort. Additional symptoms usually include one or more of the following:   Diarrhea, constipation, or both.   Abdominal swelling or bloating.   Feeling full or sick after eating a small or regular-size meal.   Frequent gas.   Mucus in the stool.   A feeling of having more stool left after a bowel movement.  Symptoms tend to come and go. They may be associated with stress, psychiatric conditions, or nothing at all.  DIAGNOSIS  There is no specific test to diagnose IBS. Your health care provider will make a diagnosis based on a physical exam, medical history, and your symptoms. You may have other tests to rule out other  conditions that may be causing your symptoms. These may include:   Blood tests.   X-rays.   CT scan.  Endoscopy and colonoscopy. This is a test in which your GI tract is viewed with a long, thin, flexible tube. TREATMENT There is no cure for IBS, but treatment can help relieve symptoms. IBS treatment often includes:   Changes to your diet, such as:  Eating more fiber.  Avoiding foods that cause symptoms.  Drinking more water.  Eating regular, medium-sized portioned meals.  Medicines. These may include:  Fiber supplements if you have constipation.  Medicine to control diarrhea (antidiarrheal medicines).  Medicine to help control muscle spasms in your GI tract (antispasmodic medicines).  Medicines to help with any mental health issues, such as antidepressants or tranquilizers.  Therapy.  Talk therapy may help with anxiety, depression, or other mental health issues that can make IBS symptoms worse.  Stress reduction.  Managing your stress can help keep symptoms under control. HOME CARE INSTRUCTIONS   Take medicines only as directed by your health care provider.  Eat a healthy diet.  Avoid foods and drinks with added sugar.  Include more whole grains, fruits, and vegetables gradually into your diet. This may be especially helpful if you have IBS with constipation.  Avoid any foods and drinks that make your symptoms worse. These may include dairy products and caffeinated or carbonated drinks.  Do not eat large meals.    Drink enough fluid to keep your urine clear or pale yellow.  Exercise regularly. Ask your health care provider for recommendations of good activities for you.  Keep all follow-up visits as directed by your health care provider. This is important. SEEK MEDICAL CARE IF:   You have constant pain.  You have trouble or pain with swallowing.  You have worsening diarrhea. SEEK IMMEDIATE MEDICAL CARE IF:   You have severe and worsening abdominal  pain.   You have diarrhea and:   You have a rash, stiff neck, or severe headache.   You are irritable, sleepy, or difficult to awaken.   You are weak, dizzy, or extremely thirsty.   You have bright red blood in your stool or you have black tarry stools.   You have unusual abdominal swelling that is painful.   You vomit continuously.   You vomit blood (hematemesis).   You have both abdominal pain and a fever.    This information is not intended to replace advice given to you by your health care provider. Make sure you discuss any questions you have with your health care provider.   Document Released: 10/08/2005 Document Revised: 10/29/2014 Document Reviewed: 06/25/2014 Elsevier Interactive Patient Education 2016 Elsevier Inc.  

## 2015-12-06 NOTE — Progress Notes (Signed)
Subjective:  Patient ID: Teresa Collins, female    DOB: [birth year]  Age: 65 y.o. MRN: [id number]  CC: Abdominal Pain and Hypertension   HPI Teresa Collins presents for follow-up after recent labs showed a suppressed TSH. Her review of systems reveals intermittent episodes of diarrhea and abdominal cramping for about 7 or 8 months. She states she can have bowel movements up to 5 times per day with urgency. The symptoms have an impact on her life since she adjusts her activities related to the availability of bathrooms. The abdominal pain is a crampy sensation that resolves after a bowel movement. She never sees blood or mucus in her stools.  Outpatient Prescriptions Prior to Visit  Medication Sig Dispense Refill  . Cholecalciferol (VITAMIN D) 2000 UNITS CAPS Take 2,000 Units by mouth daily.     . meloxicam (MOBIC) 15 MG tablet Take 1 tablet (15 mg total) by mouth daily. 30 tablet 5  . Multiple Vitamins-Minerals (CENTRUM ULTRA WOMENS) TABS Take 1 tablet by mouth daily.      Marland Kitchen triamcinolone cream (KENALOG) 0.5 % Apply 1 application topically 3 (three) times daily. 60 g 3  . VITAMIN E PO Take 1 tablet by mouth daily.     Marland Kitchen lisinopril-hydrochlorothiazide (PRINZIDE,ZESTORETIC) 10-12.5 MG per tablet TAKE ONE TABLET BY MOUTH ONCE DAILY 90 tablet 1   No facility-administered medications prior to visit.    ROS Review of Systems  Constitutional: Negative.  Negative for fever, chills, diaphoresis, activity change, appetite change and fatigue.  HENT: Negative.  Negative for sinus pressure, trouble swallowing and voice change.   Eyes: Negative.   Respiratory: Negative.  Negative for cough, choking, chest tightness, shortness of breath, wheezing and stridor.   Cardiovascular: Negative.  Negative for chest pain, palpitations and leg swelling.  Gastrointestinal: Positive for abdominal pain and diarrhea. Negative for nausea, vomiting, constipation, blood in stool, abdominal distention, anal bleeding  and rectal pain.  Endocrine: Negative.   Genitourinary: Negative.  Negative for difficulty urinating.  Musculoskeletal: Negative.  Negative for myalgias, back pain, joint swelling and arthralgias.       She complains of left wrist pain and wants to see an orthopedist about carpal tunnel syndrome.  Skin: Negative.  Negative for color change and rash.  Allergic/Immunologic: Negative.   Neurological: Negative.  Negative for dizziness and weakness.  Hematological: Negative.  Negative for adenopathy. Does not bruise/bleed easily.  Psychiatric/Behavioral: Negative.  Negative for sleep disturbance and dysphoric mood. The patient is not nervous/anxious.     Objective:  BP 144/82 mmHg  Pulse 60  Temp(Src) 97.8 F (36.6 C) (Oral)  Resp 16  Ht 5\' 1"  (1.549 m)  Wt 186 lb (84.369 kg)  BMI 35.16 kg/m2  SpO2 98%  BP Readings from Last 3 Encounters:  12/06/15 144/82  11/29/15 138/82  07/23/14 137/80    Wt Readings from Last 3 Encounters:  12/06/15 186 lb (84.369 kg)  11/29/15 188 lb (85.276 kg)  07/21/14 180 lb (81.647 kg)    Physical Exam  Constitutional: She is oriented to person, place, and time. She appears well-developed and well-nourished. No distress.  HENT:  Mouth/Throat: Oropharynx is clear and moist. No oropharyngeal exudate.  Eyes: Conjunctivae are normal. Right eye exhibits no discharge. Left eye exhibits no discharge. No scleral icterus.  Neck: Normal range of motion. Neck supple. No JVD present. No tracheal deviation present. No thyromegaly present.  Cardiovascular: Normal rate, regular rhythm, normal heart sounds and intact distal pulses.  Exam reveals no gallop and  no friction rub.   No murmur heard. Pulmonary/Chest: Effort normal and breath sounds normal. No stridor. No respiratory distress. She has no wheezes. She has no rales. She exhibits no tenderness.  Abdominal: Soft. Bowel sounds are normal. She exhibits no distension and no mass. There is no tenderness. There is  no rebound and no guarding.  Musculoskeletal: Normal range of motion. She exhibits no edema or tenderness.  Lymphadenopathy:    She has no cervical adenopathy.  Neurological: She is oriented to person, place, and time.  Skin: Skin is warm and dry. No rash noted. She is not diaphoretic. No erythema. No pallor.  Psychiatric: She has a normal mood and affect. Her behavior is normal. Judgment and thought content normal.  Vitals reviewed.   Lab Results  Component Value Date   WBC 5.8 11/29/2015   HGB 12.9 11/29/2015   HCT 38.8 11/29/2015   PLT 159.0 11/29/2015   GLUCOSE 96 11/29/2015   CHOL 204* 11/29/2015   TRIG 88.0 11/29/2015   HDL 56.40 11/29/2015   LDLDIRECT 166.8 03/31/2013   LDLCALC 130* 11/29/2015   ALT 13 11/29/2015   AST 18 11/29/2015   NA 138 11/29/2015   K 4.3 11/29/2015   CL 103 11/29/2015   CREATININE 1.18 11/29/2015   BUN 25* 11/29/2015   CO2 32 11/29/2015   TSH 0.40 12/06/2015   HGBA1C 5.5 02/12/2014    Dg Hip Unilat With Pelvis Min 4 Views Left  11/29/2015  CLINICAL DATA:  Generalized left hip pain for 4 days, no injury EXAM: DG HIP (WITH OR WITHOUT PELVIS) 4+V LEFT COMPARISON:  None. FINDINGS: Only mild degenerative joint disease of the left hip is seen with some degenerative spurring noted from the acetabulum. No fracture is seen. The left pelvic ramus is intact. The left SI joint is unremarkable. 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 Electronically Signed   By: Ivar Drape M.D.   On: 11/29/2015 11:37    Assessment & Plan:   Teresa Collins was seen today for abdominal pain and hypertension.  Diagnoses and all orders for this visit:  Low TSH level- her TSH and other TFTs are normal now, screening for autoimmune thyroiditis was negative. -     TSH; Future -     Thyroid peroxidase antibody; Future -     T4; Future -     T4, free; Future -     T3; Future  Carpal tunnel syndrome of left wrist -     Ambulatory referral to Orthopedic  Surgery  Functional diarrhea- testing for celiac disease was negative, recent CBC does not show any concern for infection or anemia, test for inflammation today are negative for any evidence of inflammatory bowel disease, will treat with Viberzi -     Eluxadoline (VIBERZI) 75 MG TABS; Take 1 tablet by mouth 2 (two) times daily. -     Gliadin antibodies, serum -     Tissue transglutaminase, IgA -     Reticulin Antibody, IgA w reflex titer -     Sedimentation rate; Future -     C-reactive protein; Future  IBS (irritable bowel syndrome)- will start Viberzi -     Eluxadoline (VIBERZI) 75 MG TABS; Take 1 tablet by mouth 2 (two) times daily.  Other orders -     lisinopril-hydrochlorothiazide (PRINZIDE,ZESTORETIC) 10-12.5 MG tablet; Take 1 tablet by mouth daily.  I have changed Teresa Collins's lisinopril-hydrochlorothiazide. I am also having her start on Eluxadoline. Additionally, I am having her maintain  her CENTRUM ULTRA WOMENS, Vitamin D, VITAMIN E PO, triamcinolone cream, meloxicam, and cyanocobalamin.  Meds ordered this encounter  Medications  . lisinopril-hydrochlorothiazide (PRINZIDE,ZESTORETIC) 10-12.5 MG tablet    Sig: Take 1 tablet by mouth daily.    Dispense:  90 tablet    Refill:  1  . cyanocobalamin 500 MCG tablet    Sig: Take 500 mcg by mouth daily.  . Eluxadoline (VIBERZI) 75 MG TABS    Sig: Take 1 tablet by mouth 2 (two) times daily.    Dispense:  180 tablet    Refill:  1     Follow-up: Return in about 3 months (around 03/04/2016).  [name], MD

## 2015-12-06 NOTE — Progress Notes (Signed)
Pre visit review using our clinic review tool, if applicable. No additional management support is needed unless otherwise documented below in the visit note. 

## 2015-12-07 ENCOUNTER — Encounter: Payer: Self-pay | Admitting: Internal Medicine

## 2015-12-07 LAB — GLIADIN ANTIBODIES, SERUM
GLIADIN IGG: 3 U (ref <20)
Gliadin IgA: 3 Units (ref <20)

## 2015-12-07 LAB — THYROID PEROXIDASE ANTIBODY: Thyroperoxidase Ab SerPl-aCnc: <1 IU/mL (ref <9)

## 2015-12-07 LAB — TISSUE TRANSGLUTAMINASE, IGA: Tissue Transglutaminase Ab, IgA: 1 U/mL (ref <4)

## 2015-12-09 LAB — RETICULIN ANTIBODIES, IGA W TITER: Reticulin Ab, IgA: NEGATIVE

## 2015-12-14 ENCOUNTER — Ambulatory Visit
Admission: RE | Admit: 2015-12-14 | Discharge: 2015-12-14 | Disposition: A | Discharge status: Home / Self Care | Payer: BLUE CROSS/BLUE SHIELD | Source: Ambulatory Visit | Attending: Internal Medicine | Admitting: Internal Medicine

## 2015-12-14 DIAGNOSIS — Z1231 Encounter for screening mammogram for malignant neoplasm of breast: Secondary | ICD-10-CM

## 2015-12-15 LAB — HM MAMMOGRAPHY

## 2015-12-15 NOTE — Addendum Note (Signed)
Addended by: Janith Lima on: 12/15/2015 08:11 AM   Modules accepted: Miquel Dunn

## 2015-12-21 DIAGNOSIS — IMO0002 Reserved for concepts with insufficient information to code with codable children: Secondary | ICD-10-CM

## 2015-12-21 HISTORY — DX: Reserved for concepts with insufficient information to code with codable children: IMO0002

## 2015-12-29 ENCOUNTER — Telehealth: Payer: Self-pay | Admitting: *Deleted

## 2015-12-29 MED ORDER — LISINOPRIL-HYDROCHLOROTHIAZIDE 10-12.5 MG PO TABS: 1.0000 | ORAL_TABLET | Freq: Every day | ORAL | Status: DC

## 2015-12-29 NOTE — Telephone Encounter (Signed)
Receive call pt states she need refills on her Lisinopril. Inform ot will send to [facility] on [address]...[name]

## 2016-01-04 ENCOUNTER — Encounter: Payer: Self-pay | Admitting: Gynecology

## 2016-01-04 ENCOUNTER — Ambulatory Visit (INDEPENDENT_AMBULATORY_CARE_PROVIDER_SITE_OTHER): Payer: BLUE CROSS/BLUE SHIELD | Admitting: Gynecology

## 2016-01-04 VITALS — BP 130/74 | Ht 63.0 in | Wt 184.0 lb

## 2016-01-04 DIAGNOSIS — N952 Postmenopausal atrophic vaginitis: Secondary | ICD-10-CM | POA: Diagnosis not present

## 2016-01-04 DIAGNOSIS — Z01419 Encounter for gynecological examination (general) (routine) without abnormal findings: Secondary | ICD-10-CM | POA: Diagnosis not present

## 2016-01-04 LAB — HM PAP SMEAR

## 2016-01-04 LAB — HM DEXA SCAN

## 2016-01-04 NOTE — Addendum Note (Signed)
Addended by: Nelva Nay on: 01/04/2016 11:16 AM   Modules accepted: Orders

## 2016-01-04 NOTE — Progress Notes (Signed)
    Teresa Collins May 17, 1951 [id number]        65 y.o.  [id number]  for annual exam.  Has not had a GYN exam in a number of years. Doing well without GYN complaints. History of abdominal myomectomy in the past with no other GYN pathology. Reports normal Pap smears.  Past medical history,surgical history, problem list, medications, allergies, family history and social history were all reviewed and documented as reviewed in the EPIC chart.  ROS:  Performed with pertinent positives and negatives included in the history, assessment and plan.   Additional significant findings :  none   Exam: Teresa Collins Filed Vitals:   01/04/16 1011  BP: 130/74  Height: 5\' 3"  (1.6 m)  Weight: 184 lb (83.462 kg)   General appearance:  Normal affect, orientation and appearance. Skin: Grossly normal HEENT: Without gross lesions.  No cervical or supraclavicular adenopathy. Thyroid normal.  Lungs:  Clear without wheezing, rales or rhonchi Cardiac: RR, without RMG Abdominal:  Soft, nontender, without masses, guarding, rebound, organomegaly or hernia Breasts:  Examined lying and sitting without masses, retractions, discharge or axillary adenopathy. Pelvic:  Ext/BUS/vagina with atrophic changes  Cervix with atrophic changes. Pap smear done  Uterus anteverted, normal size, shape and contour, midline and mobile nontender   Adnexa without masses or tenderness    Anus and perineum normal   Rectovaginal normal sphincter tone without palpated masses or tenderness.    Assessment/Plan:  65 y.o. [id number] female for annual exam.   1. Postmenopausal/atrophic genital changes. Doing well without significant hot flushes, night sweats, vaginal dryness or any vaginal bleeding. Continue to monitor and report any issues or vaginal bleeding.  2. History of leiomyoma. Uterus palpates normal today. Continue to follow with annual exams. 3. Pap smear 2012. Pap smear done today. No history of abnormal Pap smears  previously. 4. Mammography 11/2015. Continue with annual mammography when due.  SBE reviewed. 5. Colonoscopy 2015. Repeat at their recommended interval. 6. DEXA a number of years ago. Recommend baseline DEXA now she agrees to schedule. Increase calcium vitamin D reviewed. 7. Health maintenance. No routine lab work done as this is done at her primary physician's office. Follow up 1 year, sooner as needed.   Anastasio Auerbach MD, 10:45 AM 01/04/2016

## 2016-01-04 NOTE — Addendum Note (Signed)
Addended by: Janith Lima on: 01/04/2016 10:56 AM   Modules accepted: Miquel Dunn

## 2016-01-04 NOTE — Patient Instructions (Signed)
Follow up for Bone density as scheduled  Menopause is a normal process in which your reproductive ability comes to an end. This process happens gradually over a span of months to years, usually between the ages of 64 and 76. Menopause is complete when you have missed 12 consecutive menstrual periods. It is important to talk with your health care provider about some of the most common conditions that affect postmenopausal women, such as heart disease, cancer, and bone loss (osteoporosis). Adopting a healthy lifestyle and getting preventive care can help to promote your health and wellness. Those actions can also lower your chances of developing some of these common conditions. WHAT SHOULD I KNOW ABOUT MENOPAUSE? During menopause, you may experience a number of symptoms, such as:  Moderate-to-severe hot flashes.  Night sweats.  Decrease in sex drive.  Mood swings.  Headaches.  Tiredness.  Irritability.  Memory problems.  Insomnia. Choosing to treat or not to treat menopausal changes is an individual decision that you make with your health care provider. WHAT SHOULD I KNOW ABOUT HORMONE REPLACEMENT THERAPY AND SUPPLEMENTS? Hormone therapy products are effective for treating symptoms that are associated with menopause, such as hot flashes and night sweats. Hormone replacement carries certain risks, especially as you become older. If you are thinking about using estrogen or estrogen with progestin treatments, discuss the benefits and risks with your health care provider. WHAT SHOULD I KNOW ABOUT HEART DISEASE AND STROKE? Heart disease, heart attack, and stroke become more likely as you age. This may be due, in part, to the hormonal changes that your body experiences during menopause. These can affect how your body processes dietary fats, triglycerides, and cholesterol. Heart attack and stroke are both medical emergencies. There are many things that you can do to help prevent heart disease and  stroke:  Have your blood pressure checked at least every 1-2 years. High blood pressure causes heart disease and increases the risk of stroke.  If you are 54-47 years old, ask your health care provider if you should take aspirin to prevent a heart attack or a stroke.  Do not use any tobacco products, including cigarettes, chewing tobacco, or electronic cigarettes. If you need help quitting, ask your health care provider.  It is important to eat a healthy diet and maintain a healthy weight.  Be sure to include plenty of vegetables, fruits, low-fat dairy products, and lean protein.  Avoid eating foods that are high in solid fats, added sugars, or salt (sodium).  Get regular exercise. This is one of the most important things that you can do for your health.  Try to exercise for at least 150 minutes each week. The type of exercise that you do should increase your heart rate and make you sweat. This is known as moderate-intensity exercise.  Try to do strengthening exercises at least twice each week. Do these in addition to the moderate-intensity exercise.  Know your numbers.Ask your health care provider to check your cholesterol and your blood glucose. Continue to have your blood tested as directed by your health care provider. WHAT SHOULD I KNOW ABOUT CANCER SCREENING? There are several types of cancer. Take the following steps to reduce your risk and to catch any cancer development as early as possible. Breast Cancer  Practice breast self-awareness.  This means understanding how your breasts normally appear and feel.  It also means doing regular breast self-exams. Let your health care provider know about any changes, no matter how small.  If you are  64 or older, have a clinician do a breast exam (clinical breast exam or CBE) every year. Depending on your age, family history, and medical history, it may be recommended that you also have a yearly breast X-ray (mammogram).  If you  have a family history of breast cancer, talk with your health care provider about genetic screening.  If you are at high risk for breast cancer, talk with your health care provider about having an MRI and a mammogram every year.  Breast cancer (BRCA) gene test is recommended for women who have family members with BRCA-related cancers. Results of the assessment will determine the need for genetic counseling and BRCA1 and for BRCA2 testing. BRCA-related cancers include these types:  Breast. This occurs in males or females.  Ovarian.  Tubal. This may also be called fallopian tube cancer.  Cancer of the abdominal or pelvic lining (peritoneal cancer).  Prostate.  Pancreatic. Cervical, Uterine, and Ovarian Cancer Your health care provider may recommend that you be screened regularly for cancer of the pelvic organs. These include your ovaries, uterus, and vagina. This screening involves a pelvic exam, which includes checking for microscopic changes to the surface of your cervix (Pap test).  For women ages 21-64, health care providers may recommend a pelvic exam and a Pap test every three years. For women ages 30-64, they may recommend the Pap test and pelvic exam, combined with testing for human papilloma virus (HPV), every five years. Some types of HPV increase your risk of cervical cancer. Testing for HPV may also be done on women of any age who have unclear Pap test results.  Other health care providers may not recommend any screening for nonpregnant women who are considered low risk for pelvic cancer and have no symptoms. Ask your health care provider if a screening pelvic exam is right for you.  If you have had past treatment for cervical cancer or a condition that could lead to cancer, you need Pap tests and screening for cancer for at least 20 years after your treatment. If Pap tests have been discontinued for you, your risk factors (such as having a new sexual partner) need to be reassessed  to determine if you should start having screenings again. Some women have medical problems that increase the chance of getting cervical cancer. In these cases, your health care provider may recommend that you have screening and Pap tests more often.  If you have a family history of uterine cancer or ovarian cancer, talk with your health care provider about genetic screening.  If you have vaginal bleeding after reaching menopause, tell your health care provider.  There are currently no reliable tests available to screen for ovarian cancer. Lung Cancer Lung cancer screening is recommended for adults 55-80 years old who are at high risk for lung cancer because of a history of smoking. A yearly low-dose CT scan of the lungs is recommended if you:  Currently smoke.  Have a history of at least 30 pack-years of smoking and you currently smoke or have quit within the past 15 years. A pack-year is smoking an average of one pack of cigarettes per day for one year. Yearly screening should:  Continue until it has been 15 years since you quit.  Stop if you develop a health problem that would prevent you from having lung cancer treatment. Colorectal Cancer  This type of cancer can be detected and can often be prevented.  Routine colorectal cancer screening usually begins at age 50 and   continues through age 75.  If you have risk factors for colon cancer, your health care provider may recommend that you be screened at an earlier age.  If you have a family history of colorectal cancer, talk with your health care provider about genetic screening.  Your health care provider may also recommend using home test kits to check for hidden blood in your stool.  A small camera at the end of a tube can be used to examine your colon directly (sigmoidoscopy or colonoscopy). This is done to check for the earliest forms of colorectal cancer.  Direct examination of the colon should be repeated every 5-10 years until  age 75. However, if early forms of precancerous polyps or small growths are found or if you have a family history or genetic risk for colorectal cancer, you may need to be screened more often. Skin Cancer  Check your skin from head to toe regularly.  Monitor any moles. Be sure to tell your health care provider:  About any new moles or changes in moles, especially if there is a change in a mole's shape or color.  If you have a mole that is larger than the size of a pencil eraser.  If any of your family members has a history of skin cancer, especially at a young age, talk with your health care provider about genetic screening.  Always use sunscreen. Apply sunscreen liberally and repeatedly throughout the day.  Whenever you are outside, protect yourself by wearing long sleeves, pants, a wide-brimmed hat, and sunglasses. WHAT SHOULD I KNOW ABOUT OSTEOPOROSIS? Osteoporosis is a condition in which bone destruction happens more quickly than new bone creation. After menopause, you may be at an increased risk for osteoporosis. To help prevent osteoporosis or the bone fractures that can happen because of osteoporosis, the following is recommended:  If you are 19-50 years old, get at least 1,000 mg of calcium and at least 600 mg of vitamin D per day.  If you are older than age 50 but younger than age 70, get at least 1,200 mg of calcium and at least 600 mg of vitamin D per day.  If you are older than age 70, get at least 1,200 mg of calcium and at least 800 mg of vitamin D per day. Smoking and excessive alcohol intake increase the risk of osteoporosis. Eat foods that are rich in calcium and vitamin D, and do weight-bearing exercises several times each week as directed by your health care provider. WHAT SHOULD I KNOW ABOUT HOW MENOPAUSE AFFECTS MY MENTAL HEALTH? Depression may occur at any age, but it is more common as you become older. Common symptoms of depression include:  Low or sad  mood.  Changes in sleep patterns.  Changes in appetite or eating patterns.  Feeling an overall lack of motivation or enjoyment of activities that you previously enjoyed.  Frequent crying spells. Talk with your health care provider if you think that you are experiencing depression. WHAT SHOULD I KNOW ABOUT IMMUNIZATIONS? It is important that you get and maintain your immunizations. These include:  Tetanus, diphtheria, and pertussis (Tdap) booster vaccine.  Influenza every year before the flu season begins.  Pneumonia vaccine.  Shingles vaccine. Your health care provider may also recommend other immunizations.   This information is not intended to replace advice given to you by your health care provider. Make sure you discuss any questions you have with your health care provider.   Document Released: 11/30/2005 Document Revised: 10/29/2014 Document   Reviewed: 06/10/2014 Elsevier Interactive Patient Education 2016 [employer]  

## 2016-01-05 LAB — PAP IG W/ RFLX HPV ASCU

## 2016-01-09 ENCOUNTER — Encounter: Payer: Self-pay | Admitting: Gynecology

## 2016-01-09 LAB — HUMAN PAPILLOMAVIRUS, HIGH RISK: HPV DNA HIGH RISK: NOT DETECTED

## 2016-01-12 NOTE — Progress Notes (Signed)
Patient informed. Recall placed. 

## 2016-02-21 ENCOUNTER — Ambulatory Visit (INDEPENDENT_AMBULATORY_CARE_PROVIDER_SITE_OTHER): Payer: BLUE CROSS/BLUE SHIELD

## 2016-02-21 ENCOUNTER — Other Ambulatory Visit: Payer: Self-pay | Admitting: Gynecology

## 2016-02-21 ENCOUNTER — Encounter: Payer: Self-pay | Admitting: Gynecology

## 2016-02-21 DIAGNOSIS — Z01419 Encounter for gynecological examination (general) (routine) without abnormal findings: Secondary | ICD-10-CM

## 2016-02-21 DIAGNOSIS — Z13820 Encounter for screening for osteoporosis: Secondary | ICD-10-CM

## 2016-03-29 ENCOUNTER — Telehealth: Payer: Self-pay | Admitting: Internal Medicine

## 2016-03-29 NOTE — Telephone Encounter (Signed)
Pt scheduled with Raliegh Ip Pt is aware

## 2016-03-29 NOTE — Telephone Encounter (Signed)
Patient called regarding referral to [facility]- she states that she has an outstanding balancwe with them, and there is some "back and forth" that is not getting resolved. She is requesting [place] to refer her to a different location in the meantime, as her situation continues to digress with her hand. Please give her a call to advise of an update

## 2016-05-10 ENCOUNTER — Encounter: Payer: BLUE CROSS/BLUE SHIELD | Admitting: Diagnostic Neuroimaging

## 2016-05-16 ENCOUNTER — Encounter: Payer: Self-pay | Admitting: Neurology

## 2016-05-16 ENCOUNTER — Ambulatory Visit (INDEPENDENT_AMBULATORY_CARE_PROVIDER_SITE_OTHER): Payer: BLUE CROSS/BLUE SHIELD | Admitting: Neurology

## 2016-05-16 ENCOUNTER — Encounter (INDEPENDENT_AMBULATORY_CARE_PROVIDER_SITE_OTHER): Payer: Self-pay | Admitting: Neurology

## 2016-05-16 DIAGNOSIS — G5602 Carpal tunnel syndrome, left upper limb: Secondary | ICD-10-CM

## 2016-05-16 DIAGNOSIS — Z0289 Encounter for other administrative examinations: Secondary | ICD-10-CM

## 2016-05-16 DIAGNOSIS — M5412 Radiculopathy, cervical region: Secondary | ICD-10-CM | POA: Diagnosis not present

## 2016-05-16 NOTE — Progress Notes (Addendum)
   STUDY DATE: 05/16/16 PATIENT NAME: Shanalee Selfe DOB: 02/01/1951 MRN: [id number]  REFERRING PROVIDER:  Dr. Tamera Reason   HISTORY: Teresa Collins is a 65 year old woman who has several months of left hand painful numbness and tingling. She also feels that the hand is weaker.  NERVE CONDUCTION STUDIES:  The left median motor response was slowed across the wrist (distal latency of 5.0 ms) with normal forearm conduction. Amplitude was mildly reduced (6 mV).   The left median sensory response was slowed (5.2 ms).  Left ulnar motor and sensory responses had normal distal latencies, and amplitudes. There was no slowing of the motor response across the elbow.  Median F-wave response was high normal and the ulnar F-wave response was normal.  EMG STUDIES:  Needle EMG of selected muscles of the left arm was performed: Deltoid: No spontaneous activity.  Increased numbers of high amplitude and wide polyphasic motor units with moderate neuropathic recruitment. Biceps: No spontaneous activity.  Increased numbers of high amplitude and wide polyphasic motor units with moderate neuropathic recruitment. Triceps: No spontaneous activity, increased numbers of high amplitude and wide polyphasic motor units with moderate neuropathic recruitment. Extensor Digitorum Communis: No spontaneous activity.  Increased numbers of high amplitude and wide polyphasic motor units with moderate neuropathic recruitment. First DIO:   No spontaneous activity. Normal motor units with normal recruitment APB:   No spontaneous activity. Occasional polyphasic motor units with normal recruitment  IMPRESSION:  This NCV/EMG of the left arm shows the following: 1.   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 2.   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 There is no evidence of acute radiculopathy.   Bailynn Dyk A. Felecia Shelling, MD, PhD Certified in Neurology, [place]  [facility], Sleep Medicine, Pain Medicine and Neuroimaging  [facility] [address] [place], [place] [address] [phone]

## 2016-06-04 ENCOUNTER — Telehealth: Payer: Self-pay | Admitting: Neurology

## 2016-06-04 NOTE — Telephone Encounter (Signed)
I have spoken with Teresa Collins this morning and per her request, have faxed EMG/NCV ov note/results to [facility] at [phone]/fim

## 2016-06-04 NOTE — Telephone Encounter (Signed)
Pt called and is requesting a call for results and would also like results sent to [facility] office Fax: [phone], phone: [phone].

## 2016-07-10 ENCOUNTER — Other Ambulatory Visit: Payer: Self-pay | Admitting: Urology

## 2016-07-10 ENCOUNTER — Ambulatory Visit (HOSPITAL_COMMUNITY)
Admission: RE | Admit: 2016-07-10 | Discharge: 2016-07-10 | Disposition: A | Discharge status: Home / Self Care | Payer: BLUE CROSS/BLUE SHIELD | Source: Ambulatory Visit | Attending: Urology | Admitting: Urology

## 2016-07-10 DIAGNOSIS — C642 Malignant neoplasm of left kidney, except renal pelvis: Secondary | ICD-10-CM | POA: Diagnosis present

## 2016-11-26 ENCOUNTER — Encounter: Payer: Self-pay | Admitting: Internal Medicine

## 2016-12-19 ENCOUNTER — Encounter: Payer: Self-pay | Admitting: Internal Medicine

## 2016-12-19 ENCOUNTER — Ambulatory Visit (INDEPENDENT_AMBULATORY_CARE_PROVIDER_SITE_OTHER): Payer: BLUE CROSS/BLUE SHIELD | Admitting: Internal Medicine

## 2016-12-19 ENCOUNTER — Other Ambulatory Visit (INDEPENDENT_AMBULATORY_CARE_PROVIDER_SITE_OTHER): Payer: BLUE CROSS/BLUE SHIELD

## 2016-12-19 VITALS — BP 108/62 | HR 65 | Temp 98.3°F | Resp 16 | Ht 63.0 in | Wt 193.2 lb

## 2016-12-19 DIAGNOSIS — E785 Hyperlipidemia, unspecified: Secondary | ICD-10-CM

## 2016-12-19 DIAGNOSIS — R7309 Other abnormal glucose: Secondary | ICD-10-CM | POA: Diagnosis not present

## 2016-12-19 DIAGNOSIS — Z0001 Encounter for general adult medical examination with abnormal findings: Secondary | ICD-10-CM

## 2016-12-19 DIAGNOSIS — Z1231 Encounter for screening mammogram for malignant neoplasm of breast: Secondary | ICD-10-CM

## 2016-12-19 DIAGNOSIS — Z23 Encounter for immunization: Secondary | ICD-10-CM | POA: Diagnosis not present

## 2016-12-19 DIAGNOSIS — Z0000 Encounter for general adult medical examination without abnormal findings: Secondary | ICD-10-CM

## 2016-12-19 DIAGNOSIS — I10 Essential (primary) hypertension: Secondary | ICD-10-CM | POA: Diagnosis not present

## 2016-12-19 LAB — COMPREHENSIVE METABOLIC PANEL
ALT: 11 U/L (ref 0–35)
AST: 13 U/L (ref 0–37)
Albumin: 4.3 g/dL (ref 3.5–5.2)
Alkaline Phosphatase: 75 U/L (ref 39–117)
BUN: 24 mg/dL — ABNORMAL HIGH (ref 6–23)
CALCIUM: 10.4 mg/dL (ref 8.4–10.5)
CHLORIDE: 103 meq/L (ref 96–112)
CO2: 28 meq/L (ref 19–32)
CREATININE: 1.3 mg/dL — AB (ref 0.40–1.20)
GFR: 52.96 mL/min — AB (ref >60.00)
GLUCOSE: 99 mg/dL (ref 70–99)
Potassium: 3.9 mEq/L (ref 3.5–5.1)
Sodium: 138 mEq/L (ref 135–145)
Total Bilirubin: 0.5 mg/dL (ref 0.2–1.2)
Total Protein: 7.7 g/dL (ref 6.0–8.3)

## 2016-12-19 LAB — CBC WITH DIFFERENTIAL/PLATELET
BASOS PCT: 1.2 % (ref 0.0–3.0)
Basophils Absolute: 0.1 10*3/uL (ref 0.0–0.1)
Eosinophils Absolute: 0.1 10*3/uL (ref 0.0–0.7)
Eosinophils Relative: 1.5 % (ref 0.0–5.0)
HEMATOCRIT: 40 % (ref 36.0–46.0)
Hemoglobin: 13.7 g/dL (ref 12.0–15.0)
LYMPHS PCT: 25.4 % (ref 12.0–46.0)
Lymphs Abs: 1.6 10*3/uL (ref 0.7–4.0)
MCHC: 34.3 g/dL (ref 30.0–36.0)
MCV: 88 fl (ref 78.0–100.0)
MONOS PCT: 12.4 % — AB (ref 3.0–12.0)
Monocytes Absolute: 0.8 10*3/uL (ref 0.1–1.0)
NEUTROS ABS: 3.8 10*3/uL (ref 1.4–7.7)
Neutrophils Relative %: 59.5 % (ref 43.0–77.0)
PLATELETS: 174 10*3/uL (ref 150.0–400.0)
RBC: 4.55 Mil/uL (ref 3.87–5.11)
RDW: 13.6 % (ref 11.5–15.5)
WBC: 6.3 10*3/uL (ref 4.0–10.5)

## 2016-12-19 LAB — LIPID PANEL
CHOL/HDL RATIO: 4
Cholesterol: 227 mg/dL — ABNORMAL HIGH (ref 0–200)
HDL: 60.5 mg/dL (ref >39.00)
LDL CALC: 153 mg/dL — AB (ref 0–99)
NONHDL: 166.77
Triglycerides: 70 mg/dL (ref 0.0–149.0)
VLDL: 14 mg/dL (ref 0.0–40.0)

## 2016-12-19 LAB — HEMOGLOBIN A1C: Hgb A1c MFr Bld: 5.8 % (ref 4.6–6.5)

## 2016-12-19 LAB — TSH: TSH: 0.7 u[IU]/mL (ref 0.35–4.50)

## 2016-12-19 MED ORDER — HYDROCHLOROTHIAZIDE 12.5 MG PO CAPS: 12.5000 mg | ORAL_CAPSULE | Freq: Every day | ORAL | 1 refills | Status: DC

## 2016-12-19 NOTE — Progress Notes (Signed)
Subjective:  Patient ID: Teresa Collins, female    DOB: Sep 20, 1951  Age: 66 y.o. MRN: [id number]  CC: Annual Exam and Hypertension   HPI Nyhla Salter presents for a CPX.  She complains of a few episodes of lightheadedness and dizziness recently. She is concerned her blood pressure may be over controlled on the combination of the ARB and thiazide diuretic. She has had no recent episodes of chest pain, shortness of breath, palpitations, edema, or fatigue.  Outpatient Medications Prior to Visit  Medication Sig Dispense Refill  . Cholecalciferol (VITAMIN D) 2000 UNITS CAPS Take 2,000 Units by mouth daily.     . cyanocobalamin 500 MCG tablet Take 500 mcg by mouth daily.    Marland Kitchen triamcinolone cream (KENALOG) 0.5 % Apply 1 application topically 3 (three) times daily. 60 g 3  . VITAMIN E PO Take 1 tablet by mouth daily.     . Eluxadoline (VIBERZI) 75 MG TABS Take 1 tablet by mouth 2 (two) times daily. 180 tablet 1  . lisinopril-hydrochlorothiazide (PRINZIDE,ZESTORETIC) 10-12.5 MG tablet Take 1 tablet by mouth daily. 90 tablet 1  . meloxicam (MOBIC) 15 MG tablet Take 1 tablet (15 mg total) by mouth daily. 30 tablet 5  . Multiple Vitamins-Minerals (CENTRUM ULTRA WOMENS) TABS Take 1 tablet by mouth daily.       No facility-administered medications prior to visit.     ROS Review of Systems  Constitutional: Positive for unexpected weight change (wt gain). Negative for activity change, appetite change, diaphoresis and fatigue.  HENT: Negative.   Eyes: Negative for visual disturbance.  Respiratory: Negative for cough, chest tightness, shortness of breath and wheezing.   Cardiovascular: Negative.  Negative for chest pain, palpitations and leg swelling.  Gastrointestinal: Negative for abdominal pain, constipation, diarrhea, nausea and vomiting.  Endocrine: Negative.   Genitourinary: Negative.  Negative for decreased urine volume, difficulty urinating, dysuria, flank pain, frequency, genital  sores, hematuria and urgency.  Musculoskeletal: Negative.  Negative for back pain and myalgias.  Skin: Negative.  Negative for color change and pallor.  Allergic/Immunologic: Negative.   Neurological: Positive for dizziness and light-headedness. Negative for syncope, weakness, numbness and headaches.  Hematological: Negative for adenopathy. Does not bruise/bleed easily.  Psychiatric/Behavioral: Negative.     Objective:  BP 108/62 (BP Location: Left Arm, Patient Position: Sitting, Cuff Size: Large)   Pulse 65   Temp 98.3 F (36.8 C) (Oral)   Resp 16   Ht 5\' 3"  (1.6 m)   Wt 193 lb 4 oz (87.7 kg)   SpO2 95%   BMI 34.23 kg/m   BP Readings from Last 3 Encounters:  12/19/16 108/62  01/04/16 130/74  12/06/15 (!) 144/82    Wt Readings from Last 3 Encounters:  12/19/16 193 lb 4 oz (87.7 kg)  01/04/16 184 lb (83.5 kg)  12/06/15 186 lb (84.4 kg)    Physical Exam  Constitutional: She is oriented to person, place, and time. No distress.  HENT:  Mouth/Throat: Oropharynx is clear and moist. No oropharyngeal exudate.  Eyes: Conjunctivae are normal. Right eye exhibits no discharge. Left eye exhibits no discharge. No scleral icterus.  Neck: Normal range of motion. Neck supple. No JVD present. No tracheal deviation present. No thyromegaly present.  Cardiovascular: Normal rate, regular rhythm, normal heart sounds and intact distal pulses.  Exam reveals no gallop and no friction rub.   No murmur heard. Pulmonary/Chest: Effort normal and breath sounds normal. No stridor. No respiratory distress. She has no wheezes. She has no rales.  She exhibits no tenderness.  Abdominal: Soft. Bowel sounds are normal. She exhibits no distension and no mass. There is no tenderness. There is no rebound and no guarding.  Musculoskeletal: Normal range of motion. She exhibits no edema, tenderness or deformity.  Lymphadenopathy:    She has no cervical adenopathy.  Neurological: She is oriented to person, place,  and time.  Skin: Skin is warm and dry. No rash noted. She is not diaphoretic. No erythema. No pallor.  Psychiatric: She has a normal mood and affect. Her behavior is normal. Judgment and thought content normal.  Vitals reviewed.   Lab Results  Component Value Date   WBC 6.3 12/19/2016   HGB 13.7 12/19/2016   HCT 40.0 12/19/2016   PLT 174.0 12/19/2016   GLUCOSE 99 12/19/2016   CHOL 227 (H) 12/19/2016   TRIG 70.0 12/19/2016   HDL 60.50 12/19/2016   LDLDIRECT 166.8 03/31/2013   LDLCALC 153 (H) 12/19/2016   ALT 11 12/19/2016   AST 13 12/19/2016   NA 138 12/19/2016   K 3.9 12/19/2016   CL 103 12/19/2016   CREATININE 1.30 (H) 12/19/2016   BUN 24 (H) 12/19/2016   CO2 28 12/19/2016   TSH 0.70 12/19/2016   HGBA1C 5.8 12/19/2016    Dg Chest 2 View  Result Date: 07/10/2016 CLINICAL DATA:  Hypertension. History of prior left-sided renal cell carcinoma EXAM: CHEST  2 VIEW COMPARISON:  December 15, 2014 FINDINGS: Lungs are clear. Heart size and pulmonary vascularity are normal. No adenopathy. There is degenerative change in the thoracic spine. There are no blastic or lytic bone lesions. IMPRESSION: No neoplastic focus.  Lungs clear.  No adenopathy evident. Electronically Signed   By: Lowella Grip III M.D.   On: 07/10/2016 15:08    Assessment & Plan:   Korbyn was seen today for annual exam and hypertension.  Diagnoses and all orders for this visit:  Routine general medical examination at a health care facility- exam completed, labs ordered and reviewed, vaccines reviewed and updated, her Pap and colonoscopy are up-to-date, she was referred for a mammogram, patient education material was given. -     Lipid panel; Future -     Comprehensive metabolic panel; Future -     CBC with Differential/Platelet; Future -     TSH; Future  Need for prophylactic vaccination and inoculation against influenza -     Flu Vaccine QUAD 36+ mos IM  Visit for screening mammogram -     MM DIGITAL  SCREENING BILATERAL; Future  HYPERGLYCEMIA- she is prediabetic, no medications are needed, she was encouraged to improve her lifestyle modifications. -     Hemoglobin A1c; Future  Essential hypertension- her blood pressure is slightly over controlled and she is symptomatic and has also had a slight decrease in her renal function. Will stop the ACE inhibitor but will continue low-dose thiazide diuretic therapy. I've also asked her to avoid anti-inflammatories. -     hydrochlorothiazide (MICROZIDE) 12.5 MG capsule; Take 1 capsule (12.5 mg total) by mouth daily.  Need for diphtheria-tetanus-pertussis (Tdap) vaccine, adult/adolescent -     Tdap vaccine greater than or equal to 7yo IM   I have discontinued Ms. Lheureux's CENTRUM ULTRA WOMENS, meloxicam, Eluxadoline, and lisinopril-hydrochlorothiazide. I am also having her start on hydrochlorothiazide. Additionally, I am having her maintain her Vitamin D, VITAMIN E PO, triamcinolone cream, and cyanocobalamin.  Meds ordered this encounter  Medications  . hydrochlorothiazide (MICROZIDE) 12.5 MG capsule    Sig: Take 1 capsule (  12.5 mg total) by mouth daily.    Dispense:  90 capsule    Refill:  1     Follow-up: Return in about 4 months (around 04/18/2017).  [name], MD

## 2016-12-19 NOTE — Patient Instructions (Signed)

## 2016-12-19 NOTE — Assessment & Plan Note (Signed)
She has a very low Framingham risk score so I do not recommend that she take a statin for cardiovascular risk reduction

## 2016-12-19 NOTE — Progress Notes (Signed)
Pre visit review using our clinic review tool, if applicable. No additional management support is needed unless otherwise documented below in the visit note. 

## 2016-12-20 ENCOUNTER — Encounter: Payer: Self-pay | Admitting: Internal Medicine

## 2016-12-20 ENCOUNTER — Other Ambulatory Visit: Payer: Self-pay | Admitting: Internal Medicine

## 2016-12-20 DIAGNOSIS — M25552 Pain in left hip: Secondary | ICD-10-CM

## 2016-12-20 DIAGNOSIS — M1612 Unilateral primary osteoarthritis, left hip: Secondary | ICD-10-CM

## 2016-12-26 ENCOUNTER — Other Ambulatory Visit: Payer: Self-pay | Admitting: Internal Medicine

## 2016-12-26 ENCOUNTER — Telehealth: Payer: Self-pay | Admitting: Internal Medicine

## 2016-12-26 ENCOUNTER — Ambulatory Visit: Payer: BLUE CROSS/BLUE SHIELD

## 2016-12-26 VITALS — BP 202/100

## 2016-12-26 DIAGNOSIS — I10 Essential (primary) hypertension: Secondary | ICD-10-CM

## 2016-12-26 MED ORDER — AZILSARTAN-CHLORTHALIDONE 40-25 MG PO TABS
1.0000 | ORAL_TABLET | Freq: Every day | ORAL | 0 refills | Status: DC
Start: 2016-12-26 — End: 2017-01-16

## 2016-12-26 NOTE — Progress Notes (Signed)
Patient was given samples (21 day supply) of edarbyclor (with chlorthalidone)---she will try this med and take bp readings periodically during the day to monitor---appt scheduled for 3 weeks to recheck bp (nurse visit) and seek help at ED if symptoms worsen or bp remains high ---or can call back to be seen again

## 2016-12-26 NOTE — Telephone Encounter (Signed)
Patient states she came off her bp medication and since then her bp has been.  3/6 - 190/100 6pm176/95  3/7 - 210/100 This morning - 220/119   Spoke with Stef. Patient is coming in now with her medications and her bp cuff for a bp check.

## 2017-01-04 ENCOUNTER — Encounter: Payer: Self-pay | Admitting: Gynecology

## 2017-01-04 ENCOUNTER — Ambulatory Visit (INDEPENDENT_AMBULATORY_CARE_PROVIDER_SITE_OTHER): Payer: BLUE CROSS/BLUE SHIELD | Admitting: Gynecology

## 2017-01-04 VITALS — BP 110/76 | Ht 63.0 in | Wt 193.0 lb

## 2017-01-04 DIAGNOSIS — N952 Postmenopausal atrophic vaginitis: Secondary | ICD-10-CM | POA: Diagnosis not present

## 2017-01-04 DIAGNOSIS — Z01411 Encounter for gynecological examination (general) (routine) with abnormal findings: Secondary | ICD-10-CM

## 2017-01-04 NOTE — Progress Notes (Signed)
    Teresa Collins 04-07-1951 [id number]        66 y.o.  [id number] for annual exam.    Past medical history,surgical history, problem list, medications, allergies, family history and social history were all reviewed and documented as reviewed in the EPIC chart.  ROS:  Performed with pertinent positives and negatives included in the history, assessment and plan.   Additional significant findings :  None   Exam: Copywriter, advertising Vitals:   01/04/17 0933  BP: 110/76  Weight: 193 lb (87.5 kg)  Height: 5\' 3"  (1.6 m)   Body mass index is 34.19 kg/m.  General appearance:  Normal affect, orientation and appearance. Skin: Grossly normal HEENT: Without gross lesions.  No cervical or supraclavicular adenopathy. Thyroid normal.  Lungs:  Clear without wheezing, rales or rhonchi Cardiac: RR, without RMG Abdominal:  Soft, nontender, without masses, guarding, rebound, organomegaly or hernia Breasts:  Examined lying and sitting without masses, retractions, discharge or axillary adenopathy. Pelvic:  Ext, BUS, Vagina: With atrophic changes  Cervix: With atrophic changes  Uterus: Anteverted, normal size, shape and contour, midline and mobile nontender   Adnexa: Without masses or tenderness    Anus and perineum: Normal   Rectovaginal: Normal sphincter tone without palpated masses or tenderness.    Assessment/Plan:  66 y.o. [id number] female for annual exam.   1. Postmenopausal/atrophic genital changes. No significant hot flushes, night sweats, vaginal dryness or any vaginal bleeding. Report any issues or bleeding. 2. History of leiomyoma. Exam shows uterus palpating normal size. Continue to follow with annual exams. 3. Pap smear 2017. No Pap smear done today. No history of abnormal Pap smears. 4. Mammography scheduled next week. SBE monthly reviewed. 5. DEXA 2017 normal. Repeat at 5 year interval. 6. Patient has colonoscopy scheduled in April. 7. Health maintenance. No routine lab work done as  patient does this elsewhere. Follow up 1 year, sooner as needed.  Anastasio Auerbach MD, 9:48 AM 01/04/2017

## 2017-01-04 NOTE — Patient Instructions (Signed)

## 2017-01-09 ENCOUNTER — Ambulatory Visit
Admission: RE | Admit: 2017-01-09 | Discharge: 2017-01-09 | Disposition: A | Discharge status: Home / Self Care | Payer: BLUE CROSS/BLUE SHIELD | Source: Ambulatory Visit | Attending: Internal Medicine | Admitting: Internal Medicine

## 2017-01-09 DIAGNOSIS — Z1231 Encounter for screening mammogram for malignant neoplasm of breast: Secondary | ICD-10-CM

## 2017-01-16 ENCOUNTER — Other Ambulatory Visit: Payer: Self-pay | Admitting: Internal Medicine

## 2017-01-16 ENCOUNTER — Ambulatory Visit (INDEPENDENT_AMBULATORY_CARE_PROVIDER_SITE_OTHER): Payer: BLUE CROSS/BLUE SHIELD

## 2017-01-16 ENCOUNTER — Telehealth: Payer: Self-pay

## 2017-01-16 VITALS — BP 138/78

## 2017-01-16 DIAGNOSIS — I10 Essential (primary) hypertension: Secondary | ICD-10-CM

## 2017-01-16 MED ORDER — AZILSARTAN-CHLORTHALIDONE 40-12.5 MG PO TABS: 1.0000 | ORAL_TABLET | Freq: Every day | ORAL | 1 refills | Status: DC

## 2017-01-16 NOTE — Telephone Encounter (Signed)
Cont same med but at a lower dose RX sent to her pharmacy

## 2017-01-16 NOTE — Telephone Encounter (Signed)
Patient came in (nurse visit) today to have bp rechecked after taking new bp med for about 3 weeks----patient's reading today was 138/78 and patient states she is feeling much better, medicine agrees with her well---however, she states that appx 3x weekly, when taking bp after med administration, sometimes it drops as low as 90/60's but if she holds the med, her bp will eventually get up to 885'O systolic later in the day, ---routing to dr jones---do you want to keep med at current dosage or is there something else we need to do to prevent drastic lows after med administration---please advise, I will call patient back, thanks

## 2017-01-16 NOTE — Telephone Encounter (Signed)
Advised patient of dr Ronnald Ramp new rx sent to [facility]---I also left a message earlier on her home phone, need to disregard that message to call us back that was left on home phone, I was calling for same reason

## 2017-01-17 LAB — HM MAMMOGRAPHY

## 2017-01-22 ENCOUNTER — Other Ambulatory Visit: Payer: Self-pay | Admitting: Internal Medicine

## 2017-01-22 DIAGNOSIS — I10 Essential (primary) hypertension: Secondary | ICD-10-CM

## 2017-01-22 MED ORDER — TELMISARTAN-HCTZ 40-12.5 MG PO TABS: 1.0000 | ORAL_TABLET | Freq: Every day | ORAL | 1 refills | Status: DC

## 2017-01-29 ENCOUNTER — Telehealth: Payer: Self-pay

## 2017-01-29 NOTE — Telephone Encounter (Signed)
Called pt to confirm if PA needed to be done on the Edarbychlor. Pt confirmed the series of events that lead to the change and I confirmed the change of medication to Micardis.   Pt stated that she has not been taking any BP Medication. Pt has only been monitoring. Pt also states that her BP's have been very well controlled since last BP check and she also states that she is feeling great. I advised pt that I would send note to PCP for instructions.   Please advise.

## 2017-01-30 NOTE — Telephone Encounter (Signed)
If her BP is well controlled with no meds then she does not needs meds Right?

## 2017-01-30 NOTE — Telephone Encounter (Signed)
Pt informed to keep the BP med dc'ed.

## 2017-02-04 ENCOUNTER — Ambulatory Visit (AMBULATORY_SURGERY_CENTER): Payer: Self-pay | Admitting: *Deleted

## 2017-02-04 ENCOUNTER — Encounter: Payer: Self-pay | Admitting: Internal Medicine

## 2017-02-04 VITALS — Ht 63.0 in | Wt 206.8 lb

## 2017-02-04 DIAGNOSIS — Z86010 Personal history of colonic polyps: Secondary | ICD-10-CM

## 2017-02-04 MED ORDER — NA SULFATE-K SULFATE-MG SULF 17.5-3.13-1.6 GM/177ML PO SOLN: ORAL | 0 refills | Status: DC

## 2017-02-04 NOTE — Progress Notes (Signed)
No allergies to eggs or soy. No problems with anesthesia.  Pt given Emmi instructions for colonoscopy  No oxygen use  No diet drug use  

## 2017-02-13 ENCOUNTER — Ambulatory Visit (INDEPENDENT_AMBULATORY_CARE_PROVIDER_SITE_OTHER): Payer: BLUE CROSS/BLUE SHIELD

## 2017-02-13 DIAGNOSIS — Z23 Encounter for immunization: Secondary | ICD-10-CM

## 2017-02-14 ENCOUNTER — Telehealth: Payer: Self-pay | Admitting: Internal Medicine

## 2017-02-14 NOTE — Telephone Encounter (Signed)
Can we get it cheaper?

## 2017-02-14 NOTE — Telephone Encounter (Signed)
She would have to talk to them upstairs about this. She did call up there and according to the note it is the prep that she can't afford.   She also wanted to know if Dr. Ronnald Ramp thought this procedure is necessary.

## 2017-02-14 NOTE — Telephone Encounter (Signed)
Patient states she has a colonoscopy coming up and can not afford the cost of the procedure.  She wanted to know if Dr. Ronnald Ramp thought it was neccessary for her to go through with this procedure.  I have also requested the patient to contact Dr. Blanch Media office as well b/c patient should have cancelled colonoscopy yesterday if she was going to cancel.

## 2017-02-15 MED FILL — SUPREP BOWEL PREP KIT: 17.5-3.13-1 | 2 days supply | Qty: 354 | Fill #0

## 2017-02-15 NOTE — Telephone Encounter (Signed)
Called pt and informed pt that she can call to other pharmacies to have it filled if they are able to give it cheaper than [facility] on [address]. Gave pt the name and number to some other pharmacies in the area that may be able to get it cheaper.   Closing note.

## 2017-02-18 ENCOUNTER — Ambulatory Visit (AMBULATORY_SURGERY_CENTER): Payer: BLUE CROSS/BLUE SHIELD | Admitting: Internal Medicine

## 2017-02-18 ENCOUNTER — Encounter: Payer: Self-pay | Admitting: Internal Medicine

## 2017-02-18 VITALS — BP 162/78 | HR 53 | Temp 96.6°F | Resp 15 | Ht 63.0 in | Wt 206.0 lb

## 2017-02-18 DIAGNOSIS — Z86010 Personal history of colonic polyps: Secondary | ICD-10-CM

## 2017-02-18 DIAGNOSIS — D122 Benign neoplasm of ascending colon: Secondary | ICD-10-CM

## 2017-02-18 DIAGNOSIS — J40 Bronchitis, not specified as acute or chronic: Secondary | ICD-10-CM | POA: Diagnosis present

## 2017-02-18 DIAGNOSIS — D123 Benign neoplasm of transverse colon: Secondary | ICD-10-CM

## 2017-02-18 LAB — HM COLONOSCOPY

## 2017-02-18 MED ORDER — SODIUM CHLORIDE 0.9 % IV SOLN: 500.0000 mL | INTRAVENOUS | Status: DC

## 2017-02-18 NOTE — Progress Notes (Signed)
Report to PACU, RN, vss, BBS= Clear.  

## 2017-02-18 NOTE — Progress Notes (Signed)
Pt's states no medical or surgical changes since previsit or office visit. 

## 2017-02-18 NOTE — Op Note (Signed)
[place] Patient Name: Teresa Collins Procedure Date: 02/18/2017 11:14 AM MRN: [id number] Endoscopist: Docia Chuck. Henrene Pastor , MD Age: 66 Referring MD:  Date of Birth: [birth year] Gender: Female Account #: [id number] Procedure:                Colonoscopy, with cold snare polypectomy x 2 Indications:              High risk colon cancer surveillance: Personal                            history of adenoma with villous component, High                            risk colon cancer surveillance: Personal history of                            multiple (3 or more) adenomasindex exam 2012 with                            multiple adenomatous. 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 The patient's tolerance of                            previous anesthesia was also reviewed. The risks                            and benefits of the procedure and the sedation                            options and risks were discussed with the patient.                            All questions were answered, and informed consent                            was obtained. Prior Anticoagulants: The patient has                            taken no previous anticoagulant or antiplatelet  agents. ASA Grade Assessment: II - A patient with                            mild systemic disease. After reviewing the risks                            and benefits, the patient was deemed in                            satisfactory condition to undergo the procedure.                           After obtaining informed consent, the colonoscope                            was  passed under direct vision. Throughout the                            procedure, the patient's blood pressure, pulse, and                            oxygen saturations were monitored continuously. The                            Colonoscope was introduced through the anus and                            advanced to the the cecum, identified by                            appendiceal orifice and ileocecal valve. The                            ileocecal valve, appendiceal orifice, and rectum                            were photographed. The quality of the bowel                            preparation was excellent. The colonoscopy was                            performed without difficulty. The patient tolerated                            the procedure well. The bowel preparation used was                            SUPREP. Scope In: 11:24:20 AM Scope Out: 11:37:50 AM Scope Withdrawal Time: 0 hours 8 minutes 38 seconds  Total Procedure Duration: 0 hours 13 minutes 30 seconds  Findings:                 Two polyps were found in the transverse colon and  ascending colon. The polyps were 1 to 4 mm in size.                            These polyps were removed with a cold snare.                            Resection and retrieval were complete.                           Multiple diverticula were found in the left colon.                           The exam was otherwise without abnormality on                            direct and retroflexion views. Complications:            No immediate complications. Estimated blood loss:                            None. Estimated Blood Loss:     Estimated blood loss: none. Impression:               - Two 1 to 4 mm polyps in the transverse colon and                            in the ascending colon, removed with a cold snare.                            Resected and retrieved.                           - Diverticulosis in the left colon.                            - The examination was otherwise normal on direct                            and retroflexion views. Recommendation:           - Repeat colonoscopy in 5 years for surveillance.                           - Patient has a contact number available for                            emergencies. The signs and symptoms of potential                            delayed complications were discussed with the                            patient. Return to normal activities tomorrow.  Written discharge instructions were provided to the                            patient.                           - Resume previous diet.                           - Continue present medications.                           - Await pathology results. Docia Chuck. Henrene Pastor, MD 02/18/2017 11:43:35 AM This report has been signed electronically.

## 2017-02-18 NOTE — Patient Instructions (Addendum)
YOU HAD AN ENDOSCOPIC PROCEDURE TODAY AT THE [facility]:   Refer to the procedure report that was given to you for any specific questions about what was found during the examination.  If the procedure report does not answer your questions, please call your gastroenterologist to clarify.  If you requested that your care partner not be given the details of your procedure findings, then the procedure report has been included in a sealed envelope for you to review at your convenience later.  YOU SHOULD EXPECT: Some feelings of bloating in the abdomen. Passage of more gas than usual.  Walking can help get rid of the air that was put into your GI tract during the procedure and reduce the bloating. If you had a lower endoscopy (such as a colonoscopy or flexible sigmoidoscopy) you may notice spotting of blood in your stool or on the toilet paper. If you underwent a bowel prep for your procedure, you may not have a normal bowel movement for a few days.  Please Note:  You might notice some irritation and congestion in your nose or some drainage.  This is from the oxygen used during your procedure.  There is no need for concern and it should clear up in a day or so.  SYMPTOMS TO REPORT IMMEDIATELY:   Following lower endoscopy (colonoscopy or flexible sigmoidoscopy):  Excessive amounts of blood in the stool  Significant tenderness or worsening of abdominal pains  Swelling of the abdomen that is new, acute  Fever of 100F or higher  For urgent or emergent issues, a gastroenterologist can be reached at any hour by calling [phone].   DIET:  We do recommend a small meal at first, but then you may proceed to your regular diet.  Drink plenty of fluids but you should avoid alcoholic beverages for 24 hours.  MEDICATIONS: Continue present medications.  Please see handouts given to you by your recovery nurse.  ACTIVITY:  You should plan to take it easy for the rest of today and you should NOT  DRIVE or use heavy machinery until tomorrow (because of the sedation medicines used during the test).    FOLLOW UP: Our staff will call the number listed on your records the next business day following your procedure to check on you and address any questions or concerns that you may have regarding the information given to you following your procedure. If we do not reach you, we will leave a message.  However, if you are feeling well and you are not experiencing any problems, there is no need to return our call.  We will assume that you have returned to your regular daily activities without incident.  If any biopsies were taken you will be contacted by phone or by letter within the next 1-3 weeks.  Please call us at [phone] if you have not heard about the biopsies in 3 weeks.   Thank you for allowing us to provide for your healthcare needs today.   SIGNATURES/CONFIDENTIALITY: You and/or your care partner have signed paperwork which will be entered into your electronic medical record.  These signatures attest to the fact that that the information above on your After Visit Summary has been reviewed and is understood.  Full responsibility of the confidentiality of this discharge information lies with you and/or your care-partner. 

## 2017-02-18 NOTE — Progress Notes (Signed)
Called to room to assist during endoscopic procedure.  Patient ID and intended procedure confirmed with present staff. Received instructions for my participation in the procedure from the performing physician.  

## 2017-02-19 ENCOUNTER — Telehealth: Payer: Self-pay | Admitting: *Deleted

## 2017-02-19 NOTE — Telephone Encounter (Signed)
  Follow up Call-  Call back number 02/18/2017  Post procedure Call Back phone  # [phone]  Permission to leave phone message Yes  Some recent data might be hidden     Patient questions:  Do you have a fever, pain , or abdominal swelling? No. Pain Score  0 *  Have you tolerated food without any problems? Yes.    Have you been able to return to your normal activities? Yes.    Do you have any questions about your discharge instructions: Diet   No. Medications  No. Follow up visit  No.  Do you have questions or concerns about your Care? No.  Actions: * If pain score is 4 or above: No action needed, pain <4.

## 2017-02-19 NOTE — Telephone Encounter (Signed)
Patient was able to obtain prep.

## 2017-02-21 ENCOUNTER — Encounter: Payer: Self-pay | Admitting: Internal Medicine

## 2017-02-27 ENCOUNTER — Encounter: Payer: Self-pay | Admitting: Internal Medicine

## 2017-02-27 ENCOUNTER — Ambulatory Visit (INDEPENDENT_AMBULATORY_CARE_PROVIDER_SITE_OTHER): Payer: BLUE CROSS/BLUE SHIELD | Admitting: Internal Medicine

## 2017-02-27 VITALS — BP 210/100 | HR 62 | Temp 98.3°F | Resp 16 | Ht 63.0 in | Wt 200.0 lb

## 2017-02-27 DIAGNOSIS — I10 Essential (primary) hypertension: Secondary | ICD-10-CM | POA: Diagnosis not present

## 2017-02-27 MED ORDER — TORSEMIDE 20 MG PO TABS: 20.0000 mg | ORAL_TABLET | Freq: Every day | ORAL | 1 refills | Status: DC

## 2017-02-27 MED ORDER — TELMISARTAN-AMLODIPINE 40-5 MG PO TABS: 1.0000 | ORAL_TABLET | Freq: Every day | ORAL | 1 refills | Status: DC

## 2017-02-27 NOTE — Patient Instructions (Signed)

## 2017-02-27 NOTE — Progress Notes (Signed)
Subjective:  Patient ID: Teresa Collins, female    DOB: [birth year]  Age: 66 y.o. MRN: [id number]  CC: Hypertension   HPI Teresa Collins presents for a BP check - She had been taking hydrochlorothiazide for blood pressure control but she complains that it caused some facial swelling so she has not been taking anything for the last few weeks to control her blood pressure. She has had a few episodes of blurred vision but denies headache, chest pain, shortness of breath, diaphoresis, palpitations, edema, or fatigue.   Outpatient Medications Prior to Visit  Medication Sig Dispense Refill  . Cholecalciferol (VITAMIN D) 2000 UNITS CAPS Take 1,000 Units by mouth daily.     . cyanocobalamin 500 MCG tablet Take 500 mcg by mouth daily.    Marland Kitchen triamcinolone cream (KENALOG) 0.5 % Apply 1 application topically 3 (three) times daily. 60 g 3  . VITAMIN E PO Take 1 tablet by mouth daily.     Marland Kitchen telmisartan-hydrochlorothiazide (MICARDIS HCT) 40-12.5 MG tablet Take 1 tablet by mouth daily. 90 tablet 1  . 0.9 %  sodium chloride infusion      No facility-administered medications prior to visit.     ROS Review of Systems  Constitutional: Negative.  Negative for appetite change, fatigue and unexpected weight change.  HENT: Negative for sinus pressure and trouble swallowing.   Eyes: Positive for visual disturbance. Negative for photophobia, pain and itching.  Respiratory: Negative.  Negative for cough, chest tightness, shortness of breath and wheezing.   Cardiovascular: Negative for chest pain, palpitations and leg swelling.  Gastrointestinal: Negative for abdominal pain, constipation, diarrhea, nausea and vomiting.  Genitourinary: Negative.  Negative for difficulty urinating, dysuria, frequency, hematuria and urgency.  Musculoskeletal: Negative.  Negative for back pain and myalgias.  Skin: Negative.   Neurological: Negative.  Negative for dizziness, tremors, weakness, numbness and headaches.    Hematological: Negative for adenopathy. Does not bruise/bleed easily.  Psychiatric/Behavioral: Negative.     Objective:  BP (!) 210/100 (BP Location: Left Arm, Patient Position: Sitting, Cuff Size: Large)   Pulse 62   Temp 98.3 F (36.8 C) (Oral)   Resp 16   Ht 5\' 3"  (1.6 m)   Wt 200 lb (90.7 kg)   SpO2 100%   BMI 35.43 kg/m   BP Readings from Last 3 Encounters:  02/27/17 (!) 210/100  02/18/17 (!) 162/78  01/16/17 138/78    Wt Readings from Last 3 Encounters:  02/27/17 200 lb (90.7 kg)  02/18/17 206 lb (93.4 kg)  02/04/17 206 lb 12.8 oz (93.8 kg)    Physical Exam  Constitutional: She is oriented to person, place, and time. No distress.  HENT:  Mouth/Throat: Oropharynx is clear and moist. No oropharyngeal exudate.  Eyes: Conjunctivae are normal. Right eye exhibits no discharge. Left eye exhibits no discharge. No scleral icterus.  Neck: Normal range of motion. Neck supple. No JVD present. No tracheal deviation present. No thyromegaly present.  Cardiovascular: Normal rate, regular rhythm, normal heart sounds and intact distal pulses.  Exam reveals no gallop and no friction rub.   No murmur heard. EKG --  Sinus  Rhythm  WITHIN NORMAL LIMITS No LVH or ischemia No change from prior EKG  Pulmonary/Chest: Effort normal and breath sounds normal. No stridor. No respiratory distress. She has no wheezes. She has no rales. She exhibits no tenderness.  Abdominal: Soft. Bowel sounds are normal. She exhibits no distension and no mass. There is no tenderness. There is no rebound and no guarding.  Musculoskeletal: Normal range of motion. She exhibits no edema, tenderness or deformity.  Lymphadenopathy:    She has no cervical adenopathy.  Neurological: She is oriented to person, place, and time.  Skin: Skin is warm and dry. No rash noted. She is not diaphoretic. No erythema. No pallor.  Vitals reviewed.   Lab Results  Component Value Date   WBC 6.3 12/19/2016   HGB 13.7  12/19/2016   HCT 40.0 12/19/2016   PLT 174.0 12/19/2016   GLUCOSE 99 12/19/2016   CHOL 227 (H) 12/19/2016   TRIG 70.0 12/19/2016   HDL 60.50 12/19/2016   LDLDIRECT 166.8 03/31/2013   LDLCALC 153 (H) 12/19/2016   ALT 11 12/19/2016   AST 13 12/19/2016   NA 138 12/19/2016   K 3.9 12/19/2016   CL 103 12/19/2016   CREATININE 1.30 (H) 12/19/2016   BUN 24 (H) 12/19/2016   CO2 28 12/19/2016   TSH 0.70 12/19/2016   HGBA1C 5.8 12/19/2016    Mm Digital Screening Bilateral  Result Date: 01/09/2017 CLINICAL DATA:  Screening. EXAM: DIGITAL SCREENING BILATERAL MAMMOGRAM WITH CAD COMPARISON:  Previous exam(s). ACR Breast Density Category c: The breast tissue is heterogeneously dense, which may obscure small masses. FINDINGS: There are no findings suspicious for malignancy. Images were processed with CAD. IMPRESSION: No mammographic evidence of malignancy. A result letter of this screening mammogram will be mailed directly to the patient. RECOMMENDATION: Screening mammogram in one year. (Code:SM-B-01Y) BI-RADS CATEGORY  1: Negative. Electronically Signed   By: Dorise Bullion III M.D   On: 01/09/2017 14:59    Assessment & Plan:   Luanna was seen today for hypertension.  Diagnoses and all orders for this visit:  Essential hypertension- her blood pressure is not adequately well controlled, she has no changes on EKG that suggests LVH or ischemia, she now tells me she is allergic to hydrochlorothiazide so will try to control her blood pressure with a combination of a loop diuretic/ARB/calcium channel blocker. -     torsemide (DEMADEX) 20 MG tablet; Take 1 tablet (20 mg total) by mouth daily. -     Telmisartan-Amlodipine 40-5 MG TABS; Take 1 tablet by mouth daily. -     EKG 12-Lead   I have discontinued Ms. Browder's telmisartan-hydrochlorothiazide. I am also having her start on torsemide and Telmisartan-Amlodipine. Additionally, I am having her maintain her Vitamin D, VITAMIN E PO, triamcinolone  cream, and cyanocobalamin. We will stop administering sodium chloride.  Meds ordered this encounter  Medications  . torsemide (DEMADEX) 20 MG tablet    Sig: Take 1 tablet (20 mg total) by mouth daily.    Dispense:  90 tablet    Refill:  1  . Telmisartan-Amlodipine 40-5 MG TABS    Sig: Take 1 tablet by mouth daily.    Dispense:  90 tablet    Refill:  1     Follow-up: No Follow-up on file.  [name], MD

## 2017-03-19 ENCOUNTER — Ambulatory Visit: Payer: BLUE CROSS/BLUE SHIELD | Admitting: Internal Medicine

## 2017-04-09 ENCOUNTER — Ambulatory Visit (INDEPENDENT_AMBULATORY_CARE_PROVIDER_SITE_OTHER): Payer: BLUE CROSS/BLUE SHIELD | Admitting: Internal Medicine

## 2017-04-09 ENCOUNTER — Encounter: Payer: Self-pay | Admitting: Internal Medicine

## 2017-04-09 ENCOUNTER — Other Ambulatory Visit (INDEPENDENT_AMBULATORY_CARE_PROVIDER_SITE_OTHER): Payer: BLUE CROSS/BLUE SHIELD

## 2017-04-09 VITALS — BP 146/84 | HR 60 | Temp 98.2°F | Resp 16 | Ht 63.0 in | Wt 199.0 lb

## 2017-04-09 DIAGNOSIS — I10 Essential (primary) hypertension: Secondary | ICD-10-CM | POA: Diagnosis not present

## 2017-04-09 LAB — BASIC METABOLIC PANEL
BUN: 17 mg/dL (ref 6–23)
CALCIUM: 10.4 mg/dL (ref 8.4–10.5)
CO2: 29 meq/L (ref 19–32)
CREATININE: 1.1 mg/dL (ref 0.40–1.20)
Chloride: 105 mEq/L (ref 96–112)
GFR: 64.16 mL/min (ref >60.00)
Glucose, Bld: 109 mg/dL — ABNORMAL HIGH (ref 70–99)
Potassium: 4 mEq/L (ref 3.5–5.1)
SODIUM: 140 meq/L (ref 135–145)

## 2017-04-09 LAB — MAGNESIUM: MAGNESIUM: 2 mg/dL (ref 1.5–2.5)

## 2017-04-09 NOTE — Progress Notes (Signed)
Subjective:  Patient ID: Teresa Collins, female    DOB: Nov 03, 1950  Age: 66 y.o. MRN: [id number]  CC: Hypertension   HPI Fate Galanti presents for a BP check - she feels well and offers no complaints. Her BP has been well controlled on the ARB/CCB/loop diuretic combination.  Outpatient Medications Prior to Visit  Medication Sig Dispense Refill  . Cholecalciferol (VITAMIN D) 2000 UNITS CAPS Take 1,000 Units by mouth daily.     . cyanocobalamin 500 MCG tablet Take 500 mcg by mouth daily.    . Telmisartan-Amlodipine 40-5 MG TABS Take 1 tablet by mouth daily. 90 tablet 1  . torsemide (DEMADEX) 20 MG tablet Take 1 tablet (20 mg total) by mouth daily. 90 tablet 1  . triamcinolone cream (KENALOG) 0.5 % Apply 1 application topically 3 (three) times daily. 60 g 3  . VITAMIN E PO Take 1 tablet by mouth daily.      No facility-administered medications prior to visit.     ROS Review of Systems  Constitutional: Negative.  Negative for diaphoresis, fatigue and unexpected weight change.  HENT: Negative.   Eyes: Negative for visual disturbance.  Respiratory: Negative for apnea, cough, chest tightness, shortness of breath and wheezing.   Cardiovascular: Negative for chest pain, palpitations and leg swelling.  Gastrointestinal: Negative for abdominal pain, constipation, diarrhea, nausea and vomiting.  Endocrine: Negative.   Genitourinary: Negative.  Negative for difficulty urinating, dysuria, frequency, hematuria and urgency.  Musculoskeletal: Negative.  Negative for back pain, myalgias and neck pain.  Skin: Negative.  Negative for color change and rash.  Neurological: Negative.  Negative for dizziness, weakness and headaches.  Hematological: Negative.   Psychiatric/Behavioral: Negative.     Objective:  BP (!) 146/84 (BP Location: Left Arm, Patient Position: Sitting, Cuff Size: Large)   Pulse 60   Temp 98.2 F (36.8 C) (Oral)   Resp 16   Ht 5\' 3"  (1.6 m)   Wt 199 lb (90.3 kg)    SpO2 99%   BMI 35.25 kg/m   BP Readings from Last 3 Encounters:  04/09/17 (!) 146/84  02/27/17 (!) 210/100  02/18/17 (!) 162/78    Wt Readings from Last 3 Encounters:  04/09/17 199 lb (90.3 kg)  02/27/17 200 lb (90.7 kg)  02/18/17 206 lb (93.4 kg)    Physical Exam  Constitutional: She is oriented to person, place, and time. No distress.  HENT:  Mouth/Throat: Oropharynx is clear and moist. No oropharyngeal exudate.  Eyes: Conjunctivae are normal. Right eye exhibits no discharge. Left eye exhibits no discharge. No scleral icterus.  Neck: Normal range of motion. Neck supple. No JVD present. No tracheal deviation present. No thyromegaly present.  Cardiovascular: Normal rate, regular rhythm and intact distal pulses.  Exam reveals no gallop.   No murmur heard. Pulmonary/Chest: Effort normal and breath sounds normal. No respiratory distress. She has no wheezes. She has no rales. She exhibits no tenderness.  Abdominal: Soft. Bowel sounds are normal. She exhibits no distension and no mass. There is no tenderness. There is no rebound and no guarding.  Musculoskeletal: Normal range of motion. She exhibits no edema, tenderness or deformity.  Lymphadenopathy:    She has no cervical adenopathy.  Neurological: She is alert and oriented to person, place, and time.  Skin: Skin is warm and dry. No rash noted. She is not diaphoretic. No erythema. No pallor.  Psychiatric: She has a normal mood and affect. Her behavior is normal. Judgment and thought content normal.  Vitals reviewed.  Lab Results  Component Value Date   WBC 6.3 12/19/2016   HGB 13.7 12/19/2016   HCT 40.0 12/19/2016   PLT 174.0 12/19/2016   GLUCOSE 109 (H) 04/09/2017   CHOL 227 (H) 12/19/2016   TRIG 70.0 12/19/2016   HDL 60.50 12/19/2016   LDLDIRECT 166.8 03/31/2013   LDLCALC 153 (H) 12/19/2016   ALT 11 12/19/2016   AST 13 12/19/2016   NA 140 04/09/2017   K 4.0 04/09/2017   CL 105 04/09/2017   CREATININE 1.10  04/09/2017   BUN 17 04/09/2017   CO2 29 04/09/2017   TSH 0.70 12/19/2016   HGBA1C 5.8 12/19/2016    Mm Digital Screening Bilateral  Result Date: 01/09/2017 CLINICAL DATA:  Screening. EXAM: DIGITAL SCREENING BILATERAL MAMMOGRAM WITH CAD COMPARISON:  Previous exam(s). ACR Breast Density Category c: The breast tissue is heterogeneously dense, which may obscure small masses. FINDINGS: There are no findings suspicious for malignancy. Images were processed with CAD. IMPRESSION: No mammographic evidence of malignancy. A result letter of this screening mammogram will be mailed directly to the patient. RECOMMENDATION: Screening mammogram in one year. (Code:SM-B-01Y) BI-RADS CATEGORY  1: Negative. Electronically Signed   By: Dorise Bullion III M.D   On: 01/09/2017 14:59    Assessment & Plan:   Tavia was seen today for hypertension.  Diagnoses and all orders for this visit:  Essential hypertension- Her BP is well-controlled, electrolytes and renal function are normal. Will continue the current combination. -     Basic metabolic panel; Future -     Magnesium; Future   I am having Ms. Lukasik maintain her Vitamin D, VITAMIN E PO, triamcinolone cream, cyanocobalamin, torsemide, Telmisartan-Amlodipine, and meloxicam.  Meds ordered this encounter  Medications  . meloxicam (MOBIC) 15 MG tablet    Sig: Take 15 mg by mouth daily.     Follow-up: No Follow-up on file.  [name], MD

## 2017-04-11 NOTE — Patient Instructions (Signed)

## 2017-07-13 ENCOUNTER — Other Ambulatory Visit: Payer: Self-pay | Admitting: Internal Medicine

## 2017-07-13 DIAGNOSIS — L209 Atopic dermatitis, unspecified: Secondary | ICD-10-CM

## 2017-10-02 ENCOUNTER — Ambulatory Visit: Payer: BLUE CROSS/BLUE SHIELD | Admitting: Internal Medicine

## 2017-10-04 ENCOUNTER — Ambulatory Visit (INDEPENDENT_AMBULATORY_CARE_PROVIDER_SITE_OTHER): Payer: Medicare Other | Admitting: Internal Medicine

## 2017-10-04 ENCOUNTER — Other Ambulatory Visit (INDEPENDENT_AMBULATORY_CARE_PROVIDER_SITE_OTHER): Payer: Medicare Other

## 2017-10-04 ENCOUNTER — Encounter: Payer: Self-pay | Admitting: Internal Medicine

## 2017-10-04 VITALS — BP 130/80 | HR 81 | Temp 98.3°F | Resp 16 | Ht 63.0 in | Wt 198.5 lb

## 2017-10-04 DIAGNOSIS — Z23 Encounter for immunization: Secondary | ICD-10-CM | POA: Diagnosis not present

## 2017-10-04 DIAGNOSIS — I10 Essential (primary) hypertension: Secondary | ICD-10-CM

## 2017-10-04 DIAGNOSIS — B354 Tinea corporis: Secondary | ICD-10-CM | POA: Insufficient documentation

## 2017-10-04 DIAGNOSIS — R252 Cramp and spasm: Secondary | ICD-10-CM | POA: Diagnosis not present

## 2017-10-04 DIAGNOSIS — E785 Hyperlipidemia, unspecified: Secondary | ICD-10-CM | POA: Diagnosis not present

## 2017-10-04 LAB — COMPREHENSIVE METABOLIC PANEL
ALT: 11 U/L (ref 0–35)
AST: 15 U/L (ref 0–37)
Albumin: 4.6 g/dL (ref 3.5–5.2)
Alkaline Phosphatase: 88 U/L (ref 39–117)
BILIRUBIN TOTAL: 0.7 mg/dL (ref 0.2–1.2)
BUN: 22 mg/dL (ref 6–23)
CALCIUM: 10.3 mg/dL (ref 8.4–10.5)
CO2: 29 meq/L (ref 19–32)
CREATININE: 1.3 mg/dL — AB (ref 0.40–1.20)
Chloride: 101 mEq/L (ref 96–112)
GFR: 52.83 mL/min — ABNORMAL LOW (ref >60.00)
GLUCOSE: 91 mg/dL (ref 70–99)
Potassium: 4.5 mEq/L (ref 3.5–5.1)
SODIUM: 137 meq/L (ref 135–145)
Total Protein: 8.3 g/dL (ref 6.0–8.3)

## 2017-10-04 LAB — MAGNESIUM: MAGNESIUM: 2 mg/dL (ref 1.5–2.5)

## 2017-10-04 LAB — CK: CK TOTAL: 171 U/L (ref 7–177)

## 2017-10-04 LAB — PHOSPHORUS: Phosphorus: 2.4 mg/dL (ref 2.3–4.6)

## 2017-10-04 LAB — SEDIMENTATION RATE: Sed Rate: 19 mm/hr (ref 0–30)

## 2017-10-04 MED ORDER — CICLOPIROX OLAMINE 0.77 % EX CREA: TOPICAL_CREAM | Freq: Two times a day (BID) | CUTANEOUS | 2 refills | Status: DC

## 2017-10-04 NOTE — Progress Notes (Signed)
Subjective:  Patient ID: Teresa Collins, female    DOB: 03-Sep-1951  Age: 66 y.o. MRN: [id number]  CC: Hypertension and Rash   HPI Teresa Collins presents for a CPX.  She complains of an intermittent rash under both breasts.  She has tried to treat it with a topical steroid to no relief.  At times the rash itches and there is some redness and irritation.  She also complains of a several month history of diffuse body cramps and muscle stiffness.  She tells me Teresa Collins blood pressure has been well controlled.  Outpatient Medications Prior to Visit  Medication Sig Dispense Refill  . Cholecalciferol (VITAMIN D) 2000 UNITS CAPS Take 1,000 Units by mouth daily.     . cyanocobalamin 500 MCG tablet Take 500 mcg by mouth daily.    . meloxicam (MOBIC) 15 MG tablet Take 15 mg by mouth daily.    . Telmisartan-Amlodipine 40-5 MG TABS Take 1 tablet by mouth daily. 90 tablet 1  . torsemide (DEMADEX) 20 MG tablet Take 1 tablet (20 mg total) by mouth daily. 90 tablet 1  . triamcinolone cream (KENALOG) 0.5 % APPLY  CREAM TOPICALLY TO AFFECTED AREA THREE TIMES DAILY 60 g 3  . VITAMIN E PO Take 1 tablet by mouth daily.      No facility-administered medications prior to visit.     ROS Review of Systems  Objective:  BP 130/80 (BP Location: Left Arm, Patient Position: Sitting, Cuff Size: Large)   Pulse 81   Temp 98.3 F (36.8 C) (Oral)   Resp 16   Ht 5\' 3"  (1.6 m)   Wt 198 lb 8 oz (90 kg)   SpO2 96%   BMI 35.16 kg/m   BP Readings from Last 3 Encounters:  10/04/17 130/80  04/09/17 (!) 146/84  02/27/17 (!) 210/100    Wt Readings from Last 3 Encounters:  10/04/17 198 lb 8 oz (90 kg)  04/09/17 199 lb (90.3 kg)  02/27/17 200 lb (90.7 kg)    Physical Exam  Constitutional: She is oriented to person, place, and time. No distress.  HENT:  Mouth/Throat: Oropharynx is clear and moist. No oropharyngeal exudate.  Eyes: Conjunctivae are normal. Left eye exhibits no discharge. No scleral  icterus.  Neck: Normal range of motion. Neck supple. No JVD present. No thyromegaly present.  Cardiovascular: Normal rate, regular rhythm and normal heart sounds.  No murmur heard. Pulmonary/Chest: Breath sounds normal. No respiratory distress. She has no wheezes. She has no rales.  Abdominal: Soft. Bowel sounds are normal. She exhibits no distension and no mass. There is no tenderness. There is no guarding.  Musculoskeletal: Normal range of motion. She exhibits no edema, tenderness or deformity.  Lymphadenopathy:    She has no cervical adenopathy.  Neurological: She is alert and oriented to person, place, and time.  Skin: Skin is warm and dry. No rash noted. She is not diaphoretic. No erythema. No pallor.     Under both breasts there are large confluent areas of erythema and hyperpigmentation  Vitals reviewed.   Lab Results  Component Value Date   WBC 6.3 12/19/2016   HGB 13.7 12/19/2016   HCT 40.0 12/19/2016   PLT 174.0 12/19/2016   GLUCOSE 91 10/04/2017   CHOL 227 (H) 12/19/2016   TRIG 70.0 12/19/2016   HDL 60.50 12/19/2016   LDLDIRECT 166.8 03/31/2013   LDLCALC 153 (H) 12/19/2016   ALT 11 10/04/2017   AST 15 10/04/2017   NA 137 10/04/2017   K  4.5 10/04/2017   CL 101 10/04/2017   CREATININE 1.30 (H) 10/04/2017   BUN 22 10/04/2017   CO2 29 10/04/2017   TSH 0.70 12/19/2016   HGBA1C 5.8 12/19/2016    Mm Digital Screening Bilateral  Result Date: 01/09/2017 CLINICAL DATA:  Screening. EXAM: DIGITAL SCREENING BILATERAL MAMMOGRAM WITH CAD COMPARISON:  Previous exam(s). ACR Breast Density Category c: The breast tissue is heterogeneously dense, which may obscure small masses. FINDINGS: There are no findings suspicious for malignancy. Images were processed with CAD. IMPRESSION: No mammographic evidence of malignancy. A result letter of this screening mammogram will be mailed directly to the patient. RECOMMENDATION: Screening mammogram in one year. (Code:SM-B-01Y) BI-RADS CATEGORY   1: Negative. Electronically Signed   By: Dorise Bullion III M.D   On: 01/09/2017 14:59    Assessment & Plan:   Teresa Collins was seen today for hypertension and rash.  Diagnoses and all orders for this visit:  Muscle cramps- Teresa Collins CPK and sed rate are normal so I do not think she has a myopathy.  She is not taking any medications that would cause a myopathy.  The only abnormal finding on Teresa Collins electrolytes is a slightly low phosphorus level.  I have asked Teresa Collins to drink a glass of milk every day to correct this and to see if this will relieve Teresa Collins muscle cramps.  If not she will let me know and we will proceed further. -     Comprehensive metabolic panel; Future -     CK; Future -     Magnesium; Future -     Phosphorus; Future -     Sedimentation rate; Future  Essential hypertension- Teresa Collins blood pressure is adequately well controlled. -     Comprehensive metabolic panel; Future -     Magnesium; Future  Tinea corporis -     ciclopirox (LOPROX) 0.77 % cream; Apply topically 2 (two) times daily.  Need for influenza vaccination -     Flu vaccine HIGH DOSE PF (Fluzone High dose)   I am having Teresa Collins start on ciclopirox. I am also having Teresa Collins maintain Teresa Collins Vitamin D, VITAMIN E PO, cyanocobalamin, torsemide, Telmisartan-Amlodipine, meloxicam, and triamcinolone cream.   Meds ordered this encounter  Medications  . ciclopirox (LOPROX) 0.77 % cream    Sig: Apply topically 2 (two) times daily.    Dispense:  90 g    Refill:  2     Follow-up: Return in about 4 months (around 02/02/2018).  [name], MD

## 2017-10-04 NOTE — Patient Instructions (Signed)

## 2017-10-09 ENCOUNTER — Ambulatory Visit: Payer: BLUE CROSS/BLUE SHIELD | Admitting: Internal Medicine

## 2017-10-23 ENCOUNTER — Other Ambulatory Visit: Payer: Self-pay | Admitting: Internal Medicine

## 2017-10-23 DIAGNOSIS — I10 Essential (primary) hypertension: Secondary | ICD-10-CM

## 2017-12-03 ENCOUNTER — Other Ambulatory Visit: Payer: Self-pay | Admitting: Gynecology

## 2017-12-03 DIAGNOSIS — Z1231 Encounter for screening mammogram for malignant neoplasm of breast: Secondary | ICD-10-CM

## 2017-12-25 ENCOUNTER — Other Ambulatory Visit: Payer: Self-pay | Admitting: Internal Medicine

## 2017-12-25 DIAGNOSIS — M1612 Unilateral primary osteoarthritis, left hip: Secondary | ICD-10-CM

## 2017-12-25 DIAGNOSIS — M25552 Pain in left hip: Secondary | ICD-10-CM

## 2017-12-30 DIAGNOSIS — C641 Malignant neoplasm of right kidney, except renal pelvis: Secondary | ICD-10-CM | POA: Diagnosis not present

## 2018-01-03 DIAGNOSIS — C642 Malignant neoplasm of left kidney, except renal pelvis: Secondary | ICD-10-CM | POA: Diagnosis not present

## 2018-01-03 DIAGNOSIS — K7689 Other specified diseases of liver: Secondary | ICD-10-CM | POA: Diagnosis not present

## 2018-01-06 ENCOUNTER — Encounter: Payer: BLUE CROSS/BLUE SHIELD | Admitting: Gynecology

## 2018-01-06 DIAGNOSIS — C642 Malignant neoplasm of left kidney, except renal pelvis: Secondary | ICD-10-CM | POA: Diagnosis not present

## 2018-01-09 ENCOUNTER — Encounter: Payer: Self-pay | Admitting: Gynecology

## 2018-01-09 ENCOUNTER — Ambulatory Visit (INDEPENDENT_AMBULATORY_CARE_PROVIDER_SITE_OTHER): Payer: Medicare Other | Admitting: Gynecology

## 2018-01-09 VITALS — BP 130/80 | Ht 63.0 in | Wt 193.0 lb

## 2018-01-09 DIAGNOSIS — Z01411 Encounter for gynecological examination (general) (routine) with abnormal findings: Secondary | ICD-10-CM | POA: Diagnosis not present

## 2018-01-09 DIAGNOSIS — N898 Other specified noninflammatory disorders of vagina: Secondary | ICD-10-CM | POA: Diagnosis not present

## 2018-01-09 DIAGNOSIS — D259 Leiomyoma of uterus, unspecified: Secondary | ICD-10-CM | POA: Diagnosis not present

## 2018-01-09 DIAGNOSIS — N952 Postmenopausal atrophic vaginitis: Secondary | ICD-10-CM | POA: Diagnosis not present

## 2018-01-09 LAB — WET PREP FOR TRICH, YEAST, CLUE

## 2018-01-09 MED ORDER — METRONIDAZOLE 500 MG PO TABS: 500.0000 mg | ORAL_TABLET | Freq: Two times a day (BID) | ORAL | 0 refills | Status: DC

## 2018-01-09 NOTE — Addendum Note (Signed)
Addended by: Nelva Nay on: 01/09/2018 12:49 PM   Modules accepted: Orders

## 2018-01-09 NOTE — Progress Notes (Signed)
    Teresa Collins [birth year] [id number]        67 y.o.  [id number] for breast and pelvic exam.  Patient also complaining of 6 months of on and off vaginal discharge that comes and goes that she notices in her underwear.  No odor or itching/irritation.  No urinary symptoms such as frequency dysuria urgency low back pain fever or chills.  No nausea vomiting diarrhea constipation.  Past medical history,surgical history, problem list, medications, allergies, family history and social history were all reviewed and documented as reviewed in the EPIC chart.  ROS:  Performed with pertinent positives and negatives included in the history, assessment and plan.   Additional significant findings : None   Exam: Caryn Bee assistant Vitals:   01/09/18 1038  BP: 130/80  Weight: 193 lb (87.5 kg)  Height: 5\' 3"  (1.6 m)   Body mass index is 34.19 kg/m.  General appearance:  Normal affect, orientation and appearance. Skin: Grossly normal HEENT: Without gross lesions.  No cervical or supraclavicular adenopathy. Thyroid normal.  Lungs:  Clear without wheezing, rales or rhonchi Cardiac: RR, without RMG Abdominal:  Soft, nontender, without masses, guarding, rebound, organomegaly or hernia Breasts:  Examined lying and sitting without masses, retractions, discharge or axillary adenopathy. Pelvic:  Ext, BUS, Vagina: With atrophic changes.  White discharge noted.  Cervix: With atrophic changes  Uterus: Anteverted, normal size, shape and contour, midline and mobile nontender   Adnexa: Without masses or tenderness    Anus and perineum: Normal   Rectovaginal: Normal sphincter tone without palpated masses or tenderness.    Assessment/Plan:  67 y.o. [id number] female for breast and pelvic exam.   1. Intermittent vaginal discharge.  Exam shows white discharge.  Wet prep positive for clue cells all consistent with a low-grade bacterial vaginosis.  I reviewed this with the patient.  Options for treatment were  discussed and we will treat with Flagyl 500 mg twice daily times 7 days.  Alcohol avoidance reviewed.  Patient will follow-up if her symptoms persist, worsen or recur. 2. History of leiomyoma.  Exam shows uterus palpates normal.  We will continue with annual exam surveillance.  Patient without pelvic symptoms. 3. Postmenopausal/atrophic genital changes.  No significant hot flushes, night sweats, vaginal dryness or any bleeding. 4. Pap smear 12/2015.  No Pap smear done today.  No history of abnormal Pap smears.  Plan repeat Pap smear next year at 3-year interval per current screening guidelines. 5. DEXA 2017 normal.  Recommend repeat DEXA at 5-year interval. 6. Colonoscopy 2018.  Repeat at their recommended interval. 7. Mammography scheduled tomorrow.  Breast exam normal today. 8. Health maintenance.  No routine lab work done as patient does this elsewhere.  Follow-up 1 year, sooner if vaginal discharge continues or other issues.  Additional time in excess of her breast and pelvic exam was spent in direct face to face counseling and coordination of care in regards to her vaginal discharge with medication provided.    Anastasio Auerbach MD, 11:32 AM 01/09/2018

## 2018-01-09 NOTE — Patient Instructions (Signed)
Take the prescribed medication twice daily for 7 days.  Avoid alcohol while taking.  Follow-up if the vaginal discharge continues.  Follow-up in 1 year for annual exam

## 2018-01-10 ENCOUNTER — Ambulatory Visit
Admission: RE | Admit: 2018-01-10 | Discharge: 2018-01-10 | Disposition: A | Discharge status: Home / Self Care | Payer: Medicare Other | Source: Ambulatory Visit | Attending: Gynecology | Admitting: Gynecology

## 2018-01-10 DIAGNOSIS — Z1231 Encounter for screening mammogram for malignant neoplasm of breast: Secondary | ICD-10-CM | POA: Diagnosis not present

## 2018-01-10 LAB — HM MAMMOGRAPHY

## 2018-01-15 DIAGNOSIS — H26491 Other secondary cataract, right eye: Secondary | ICD-10-CM | POA: Diagnosis not present

## 2018-04-17 ENCOUNTER — Other Ambulatory Visit (INDEPENDENT_AMBULATORY_CARE_PROVIDER_SITE_OTHER): Payer: Medicare Other

## 2018-04-17 ENCOUNTER — Encounter: Payer: Self-pay | Admitting: Internal Medicine

## 2018-04-17 ENCOUNTER — Ambulatory Visit (INDEPENDENT_AMBULATORY_CARE_PROVIDER_SITE_OTHER): Payer: Medicare Other | Admitting: Internal Medicine

## 2018-04-17 VITALS — BP 128/80 | HR 65 | Temp 99.0°F | Ht 63.0 in | Wt 175.5 lb

## 2018-04-17 DIAGNOSIS — E785 Hyperlipidemia, unspecified: Secondary | ICD-10-CM

## 2018-04-17 DIAGNOSIS — Z0000 Encounter for general adult medical examination without abnormal findings: Secondary | ICD-10-CM

## 2018-04-17 DIAGNOSIS — Z23 Encounter for immunization: Secondary | ICD-10-CM

## 2018-04-17 DIAGNOSIS — E559 Vitamin D deficiency, unspecified: Secondary | ICD-10-CM

## 2018-04-17 DIAGNOSIS — I10 Essential (primary) hypertension: Secondary | ICD-10-CM | POA: Diagnosis not present

## 2018-04-17 DIAGNOSIS — C642 Malignant neoplasm of left kidney, except renal pelvis: Secondary | ICD-10-CM

## 2018-04-17 LAB — TSH: TSH: 0.52 u[IU]/mL (ref 0.35–4.50)

## 2018-04-17 LAB — CBC WITH DIFFERENTIAL/PLATELET
Basophils Absolute: 0 10*3/uL (ref 0.0–0.1)
Basophils Relative: 0.9 % (ref 0.0–3.0)
Eosinophils Absolute: 0 10*3/uL (ref 0.0–0.7)
Eosinophils Relative: 1 % (ref 0.0–5.0)
HCT: 39.4 % (ref 36.0–46.0)
Hemoglobin: 13.4 g/dL (ref 12.0–15.0)
Lymphocytes Relative: 23 % (ref 12.0–46.0)
Lymphs Abs: 1 10*3/uL (ref 0.7–4.0)
MCHC: 34.1 g/dL (ref 30.0–36.0)
MCV: 91 fl (ref 78.0–100.0)
Monocytes Absolute: 0.6 10*3/uL (ref 0.1–1.0)
Monocytes Relative: 12.3 % — ABNORMAL HIGH (ref 3.0–12.0)
Neutro Abs: 2.8 10*3/uL (ref 1.4–7.7)
Neutrophils Relative %: 62.8 % (ref 43.0–77.0)
Platelets: 179 10*3/uL (ref 150.0–400.0)
RBC: 4.33 Mil/uL (ref 3.87–5.11)
RDW: 13.9 % (ref 11.5–15.5)
WBC: 4.5 10*3/uL (ref 4.0–10.5)

## 2018-04-17 LAB — COMPREHENSIVE METABOLIC PANEL
ALT: 13 U/L (ref 0–35)
AST: 16 U/L (ref 0–37)
Albumin: 4.2 g/dL (ref 3.5–5.2)
Alkaline Phosphatase: 75 U/L (ref 39–117)
BUN: 17 mg/dL (ref 6–23)
CHLORIDE: 104 meq/L (ref 96–112)
CO2: 30 meq/L (ref 19–32)
CREATININE: 1.12 mg/dL (ref 0.40–1.20)
Calcium: 10.5 mg/dL (ref 8.4–10.5)
GFR: 62.64 mL/min (ref >60.00)
GLUCOSE: 109 mg/dL — AB (ref 70–99)
Potassium: 3.8 mEq/L (ref 3.5–5.1)
SODIUM: 140 meq/L (ref 135–145)
Total Bilirubin: 0.5 mg/dL (ref 0.2–1.2)
Total Protein: 7.5 g/dL (ref 6.0–8.3)

## 2018-04-17 LAB — URINALYSIS, ROUTINE W REFLEX MICROSCOPIC
Bilirubin Urine: NEGATIVE
Hgb urine dipstick: NEGATIVE
Ketones, ur: NEGATIVE
Leukocytes, UA: NEGATIVE
Nitrite: NEGATIVE
RBC / HPF: NONE SEEN (ref 0–?)
Specific Gravity, Urine: 1.025 (ref 1.000–1.030)
Total Protein, Urine: NEGATIVE
Urine Glucose: NEGATIVE
Urobilinogen, UA: 0.2 (ref 0.0–1.0)
WBC, UA: NONE SEEN (ref 0–?)
pH: 5.5 (ref 5.0–8.0)

## 2018-04-17 LAB — LIPID PANEL
Cholesterol: 218 mg/dL — ABNORMAL HIGH (ref 0–200)
HDL: 51.5 mg/dL (ref >39.00)
LDL Cholesterol: 138 mg/dL — ABNORMAL HIGH (ref 0–99)
NonHDL: 166.64
Total CHOL/HDL Ratio: 4
Triglycerides: 142 mg/dL (ref 0.0–149.0)
VLDL: 28.4 mg/dL (ref 0.0–40.0)

## 2018-04-17 LAB — VITAMIN D 25 HYDROXY (VIT D DEFICIENCY, FRACTURES): VITD: 45.78 ng/mL (ref 30.00–100.00)

## 2018-04-17 NOTE — Progress Notes (Signed)
Subjective:  Patient ID: Teresa Collins, female    DOB: October 30, 1950  Age: 67 y.o. MRN: [id number]  CC: Annual Exam and Hypertension   HPI Demaya Hardge presents for a CPX.  She tells me her blood pressure has been well controlled.  She works out at [employer] about 4 times a week and denies any episodes of CP, DOE, palpitations, edema, or fatigue.  Past Medical History:  Diagnosis Date  . Arthritis   . ASCUS favor benign 12/2015   negative high risk HPV. Recommend repeat Pap smear in one year  . Eczema   . GERD (gastroesophageal reflux disease)   . Hx of colonic polyps   . Hyperlipidemia   . Hypertension   . Incisional hernia   . Numbness and tingling    RT THIGH  . Renal cancer ([place]) 2015   Left  . STD (sexually transmitted disease)    GC   Past Surgical History:  Procedure Laterality Date  . abortions    . CARPAL TUNNEL RELEASE Left 2017  . CESAREAN SECTION     1 time  . INSERTION OF MESH N/A 07/21/2014   Procedure: INSERTION OF MESH;  Surgeon: Coralie Keens, MD;  Location: [facility];  Service: General;  Laterality: N/A;  . MYOMECTOMY ABDOMINAL APPROACH    . ROBOTIC ASSITED PARTIAL NEPHRECTOMY Left 07/21/2014   Procedure: ROBOTIC ASSISTED LEFT PARTIAL NEPHRECTOMY;  Surgeon: Ardis Hughs, MD;  Location: [facility];  Service: Urology;  Laterality: Left;  . TUBAL LIGATION    . VENTRAL HERNIA REPAIR N/A 07/21/2014   Procedure: INCISIONAL HERNIA REPAIR WITH MESH ;  Surgeon: Coralie Keens, MD;  Location: [facility];  Service: General;  Laterality: N/A;    reports that she has never smoked. She has never used smokeless tobacco. She reports that she drinks alcohol. She reports that she has current or past drug history. Drug: Marijuana. family history includes Hypertension in her brother and sister. Allergies  Allergen Reactions  . Hydrochlorothiazide Swelling  . Lipitor [Atorvastatin] Other (See Comments)    Muscle aches  . Bystolic [Nebivolol Hcl] Swelling  . Codeine  Sulfate Hives    REACTION: rash and itching  . Latex Rash    Outpatient Medications Prior to Visit  Medication Sig Dispense Refill  . Cholecalciferol (VITAMIN D) 2000 UNITS CAPS Take 1,000 Units by mouth daily.     . ciclopirox (LOPROX) 0.77 % cream Apply topically 2 (two) times daily. 90 g 2  . cyanocobalamin 500 MCG tablet Take 500 mcg by mouth daily.    . meloxicam (MOBIC) 15 MG tablet Take 15 mg by mouth daily.    . Telmisartan-Amlodipine 40-5 MG TABS TAKE 1 TABLET BY MOUTH ONCE DAILY 90 tablet 1  . torsemide (DEMADEX) 20 MG tablet Take 1 tablet (20 mg total) by mouth daily. 90 tablet 1  . triamcinolone cream (KENALOG) 0.5 % APPLY  CREAM TOPICALLY TO AFFECTED AREA THREE TIMES DAILY 60 g 3  . VITAMIN E PO Take 1 tablet by mouth daily.     . metroNIDAZOLE (FLAGYL) 500 MG tablet Take 1 tablet (500 mg total) by mouth 2 (two) times daily. For 7 days.  Avoid alcohol while taking 14 tablet 0  . Multiple Vitamin (MULTI-VITAMIN PO) Take by mouth.     No facility-administered medications prior to visit.     ROS Review of Systems  Constitutional: Negative.  Negative for appetite change, diaphoresis, fatigue and unexpected weight change.  HENT: Negative.   Eyes: Negative  for visual disturbance.  Respiratory: Negative for apnea, cough, shortness of breath and wheezing.   Cardiovascular: Negative for chest pain, palpitations and leg swelling.  Gastrointestinal: Negative for abdominal pain, constipation, diarrhea, nausea and vomiting.  Endocrine: Negative.   Genitourinary: Negative.  Negative for difficulty urinating.  Musculoskeletal: Negative.  Negative for arthralgias, back pain, myalgias and neck pain.  Skin: Negative.  Negative for color change, pallor and rash.  Allergic/Immunologic: Negative.   Neurological: Negative.  Negative for dizziness, weakness, light-headedness, numbness and headaches.  Hematological: Negative for adenopathy. Does not bruise/bleed easily.    Psychiatric/Behavioral: Negative.     Objective:  BP 128/80 (BP Location: Left Arm, Patient Position: Sitting, Cuff Size: Large)   Pulse 65   Temp 99 F (37.2 C) (Oral)   Ht 5\' 3"  (1.6 m)   Wt 175 lb 8 oz (79.6 kg)   SpO2 99%   BMI 31.09 kg/m   BP Readings from Last 3 Encounters:  04/17/18 128/80  01/09/18 130/80  10/04/17 130/80    Wt Readings from Last 3 Encounters:  04/17/18 175 lb 8 oz (79.6 kg)  01/09/18 193 lb (87.5 kg)  10/04/17 198 lb 8 oz (90 kg)    Physical Exam  Constitutional: She is oriented to person, place, and time. No distress.  HENT:  Mouth/Throat: Oropharynx is clear and moist. No oropharyngeal exudate.  Eyes: Conjunctivae are normal. No scleral icterus.  Neck: Normal range of motion. Neck supple. No JVD present. No thyromegaly present.  Cardiovascular: Normal rate, regular rhythm and normal heart sounds. Exam reveals no friction rub.  No murmur heard. Pulmonary/Chest: Effort normal and breath sounds normal. No respiratory distress. She has no wheezes. She has no rales.  Abdominal: Soft. Normal appearance and bowel sounds are normal. She exhibits no mass. There is no hepatosplenomegaly. There is no tenderness. No hernia.  Genitourinary:  Genitourinary Comments: Breast, GU, rectal exams were all deferred at her request since she tells me these are done once a year by her gynecologist.  Musculoskeletal: Normal range of motion. She exhibits no edema, tenderness or deformity.  Lymphadenopathy:    She has no cervical adenopathy.  Neurological: She is alert and oriented to person, place, and time.  Skin: Skin is warm and dry. She is not diaphoretic. No pallor.  Psychiatric: She has a normal mood and affect. Her behavior is normal. Judgment and thought content normal.  Vitals reviewed.   Lab Results  Component Value Date   WBC 4.5 04/17/2018   HGB 13.4 04/17/2018   HCT 39.4 04/17/2018   PLT 179.0 04/17/2018   GLUCOSE 109 (H) 04/17/2018   CHOL 218  (H) 04/17/2018   TRIG 142.0 04/17/2018   HDL 51.50 04/17/2018   LDLDIRECT 166.8 03/31/2013   LDLCALC 138 (H) 04/17/2018   ALT 13 04/17/2018   AST 16 04/17/2018   NA 140 04/17/2018   K 3.8 04/17/2018   CL 104 04/17/2018   CREATININE 1.12 04/17/2018   BUN 17 04/17/2018   CO2 30 04/17/2018   TSH 0.52 04/17/2018   HGBA1C 5.8 12/19/2016    Mm Screening Breast Tomo Bilateral  Result Date: 01/10/2018 CLINICAL DATA:  Screening. EXAM: DIGITAL SCREENING BILATERAL MAMMOGRAM WITH TOMO AND CAD COMPARISON:  Previous exam(s). ACR Breast Density Category c: The breast tissue is heterogeneously dense, which may obscure small masses. FINDINGS: There are no findings suspicious for malignancy. Images were processed with CAD. IMPRESSION: No mammographic evidence of malignancy. A result letter of this screening mammogram will be mailed  directly to the patient. RECOMMENDATION: Screening mammogram in one year. (Code:SM-B-01Y) BI-RADS CATEGORY  1: Negative. Electronically Signed   By: Lajean Manes M.D.   On: 01/10/2018 07:52    Assessment & Plan:   Ahilyn was seen today for annual exam and hypertension.  Diagnoses and all orders for this visit:  Renal cell carcinoma of left kidney (HCC)- There is no evidence of recurrence -     Urinalysis, Routine w reflex microscopic; Future  Essential hypertension- Her blood pressure is well controlled.  Electrolytes and renal function are normal. -     CBC with Differential/Platelet; Future -     Lipid panel; Future -     Comprehensive metabolic panel; Future -     TSH; Future  Routine general medical examination at a health care facility  Vitamin D deficiency- Her vitamin D level is normal now. -     VITAMIN D 25 Hydroxy (Vit-D Deficiency, Fractures); Future  Need for pneumococcal vaccination -     Pneumococcal polysaccharide vaccine 23-valent greater than or equal to 2yo subcutaneous/IM  Hyperlipidemia with target LDL less than 130- Her ASCVD risk score  is only 10% so I do not recommend a statin for CV risk reduction at this time.   I have discontinued Addison Eisenmenger's Multiple Vitamin (MULTI-VITAMIN PO) and metroNIDAZOLE. I am also having her maintain her Vitamin D, VITAMIN E PO, vitamin B-12, torsemide, meloxicam, triamcinolone cream, ciclopirox, and Telmisartan-amLODIPine.  No orders of the defined types were placed in this encounter.  See AVS for instructions about healthy living and anticipatory guidance.  Follow-up: Return in about 6 months (around 10/17/2018).  [name], MD

## 2018-04-17 NOTE — Patient Instructions (Signed)

## 2018-04-20 ENCOUNTER — Encounter: Payer: Self-pay | Admitting: Internal Medicine

## 2018-04-20 NOTE — Assessment & Plan Note (Signed)

## 2018-05-04 ENCOUNTER — Other Ambulatory Visit: Payer: Self-pay | Admitting: Internal Medicine

## 2018-05-04 DIAGNOSIS — I10 Essential (primary) hypertension: Secondary | ICD-10-CM

## 2018-06-17 ENCOUNTER — Telehealth: Payer: Self-pay

## 2018-06-17 NOTE — Telephone Encounter (Signed)
Copied from [place] [id number]. Topic: Quick Communication - See Telephone Encounter >> Jun 16, 2018 11:57 AM Marja Kays F wrote: Pt is wanting to know that due to again when does she or does she at all need the shingles shot  Best number [phone]  Patient needs to get shingrix vaccine---she currently has medicare and medicaid/primary insurance, patient advised to get vaccine at her pharmacy according to insurance rules/coverage

## 2018-07-29 ENCOUNTER — Other Ambulatory Visit: Payer: Self-pay | Admitting: Internal Medicine

## 2018-07-29 DIAGNOSIS — L209 Atopic dermatitis, unspecified: Secondary | ICD-10-CM

## 2018-09-17 ENCOUNTER — Encounter: Payer: Self-pay | Admitting: Internal Medicine

## 2018-09-17 ENCOUNTER — Ambulatory Visit (INDEPENDENT_AMBULATORY_CARE_PROVIDER_SITE_OTHER): Payer: Medicare Other | Admitting: Internal Medicine

## 2018-09-17 VITALS — BP 158/90 | HR 60 | Temp 98.1°F | Resp 16 | Ht 63.0 in | Wt 170.5 lb

## 2018-09-17 DIAGNOSIS — Z23 Encounter for immunization: Secondary | ICD-10-CM | POA: Diagnosis not present

## 2018-09-17 DIAGNOSIS — I10 Essential (primary) hypertension: Secondary | ICD-10-CM | POA: Diagnosis not present

## 2018-09-17 MED ORDER — AZILSARTAN-CHLORTHALIDONE 40-12.5 MG PO TABS: 1.0000 | ORAL_TABLET | Freq: Every day | ORAL | 0 refills | Status: DC

## 2018-09-17 NOTE — Progress Notes (Signed)
Subjective:  Patient ID: Teresa Collins, female    DOB: 02/20/1951  Age: 67 y.o. MRN: [id number]  CC: Hypertension   HPI Teresa Collins presents for a BP check - She has been told that her combination medicine of telmisartan and amlodipine will be discontinued soon so she needs to switch to a new agent.  She also tells me that her blood pressure has not been well controlled on this regimen.  She feels well and offers no complaints.  Outpatient Medications Prior to Visit  Medication Sig Dispense Refill  . Cholecalciferol (VITAMIN D) 2000 UNITS CAPS Take 1,000 Units by mouth daily.     . cyanocobalamin 500 MCG tablet Take 500 mcg by mouth daily.    Marland Kitchen triamcinolone cream (KENALOG) 0.5 % APPLY  CREAM TOPICALLY TO AFFECTED AREA(S) THREE TIMES DAILY 60 g 1  . meloxicam (MOBIC) 15 MG tablet Take 15 mg by mouth daily.    . Telmisartan-amLODIPine 40-5 MG TABS TAKE 1 TABLET BY MOUTH ONCE DAILY 90 tablet 1  . VITAMIN E PO Take 1 tablet by mouth daily.     . ciclopirox (LOPROX) 0.77 % cream Apply topically 2 (two) times daily. 90 g 2  . torsemide (DEMADEX) 20 MG tablet Take 1 tablet (20 mg total) by mouth daily. 90 tablet 1   No facility-administered medications prior to visit.     ROS Review of Systems  Constitutional: Negative for appetite change, fatigue and fever.  HENT: Negative.   Eyes: Negative.   Respiratory: Negative for cough, chest tightness, shortness of breath and wheezing.   Cardiovascular: Negative for chest pain, palpitations and leg swelling.  Gastrointestinal: Negative.  Negative for abdominal pain, constipation, diarrhea and nausea.  Endocrine: Negative.   Genitourinary: Negative.  Negative for difficulty urinating.  Musculoskeletal: Negative.  Negative for arthralgias and myalgias.  Skin: Negative.   Neurological: Negative.  Negative for dizziness, weakness, light-headedness and headaches.  Hematological: Negative for adenopathy. Does not bruise/bleed easily.     Objective:  BP (!) 158/90 (BP Location: Left Arm, Patient Position: Sitting, Cuff Size: Normal)   Pulse 60   Temp 98.1 F (36.7 C) (Oral)   Resp 16   Ht 5\' 3"  (1.6 m)   Wt 170 lb 8 oz (77.3 kg)   SpO2 99%   BMI 30.20 kg/m   BP Readings from Last 3 Encounters:  09/17/18 (!) 158/90  04/17/18 128/80  01/09/18 130/80    Wt Readings from Last 3 Encounters:  09/17/18 170 lb 8 oz (77.3 kg)  04/17/18 175 lb 8 oz (79.6 kg)  01/09/18 193 lb (87.5 kg)    Physical Exam  Constitutional: She is oriented to person, place, and time. No distress.  HENT:  Mouth/Throat: Oropharynx is clear and moist. No oropharyngeal exudate.  Eyes: Conjunctivae are normal. No scleral icterus.  Neck: Normal range of motion. No JVD present. No thyromegaly present.  Cardiovascular: Normal rate, regular rhythm and normal heart sounds.  No murmur heard. Pulmonary/Chest: Effort normal and breath sounds normal. No respiratory distress. She has no wheezes. She has no rales.  Abdominal: Normal appearance. She exhibits no mass. There is no hepatosplenomegaly. There is no tenderness.  Musculoskeletal: Normal range of motion. She exhibits no edema, tenderness or deformity.  Lymphadenopathy:    She has no cervical adenopathy.  Neurological: She is alert and oriented to person, place, and time.  Skin: Skin is warm and dry. She is not diaphoretic. No pallor.  Vitals reviewed.   Lab Results  Component Value Date   WBC 4.5 04/17/2018   HGB 13.4 04/17/2018   HCT 39.4 04/17/2018   PLT 179.0 04/17/2018   GLUCOSE 109 (H) 04/17/2018   CHOL 218 (H) 04/17/2018   TRIG 142.0 04/17/2018   HDL 51.50 04/17/2018   LDLDIRECT 166.8 03/31/2013   LDLCALC 138 (H) 04/17/2018   ALT 13 04/17/2018   AST 16 04/17/2018   NA 140 04/17/2018   K 3.8 04/17/2018   CL 104 04/17/2018   CREATININE 1.12 04/17/2018   BUN 17 04/17/2018   CO2 30 04/17/2018   TSH 0.52 04/17/2018   HGBA1C 5.8 12/19/2016    Mm Screening Breast Tomo  Bilateral  Result Date: 01/10/2018 CLINICAL DATA:  Screening. EXAM: DIGITAL SCREENING BILATERAL MAMMOGRAM WITH TOMO AND CAD COMPARISON:  Previous exam(s). ACR Breast Density Category c: The breast tissue is heterogeneously dense, which may obscure small masses. FINDINGS: There are no findings suspicious for malignancy. Images were processed with CAD. IMPRESSION: No mammographic evidence of malignancy. A result letter of this screening mammogram will be mailed directly to the patient. RECOMMENDATION: Screening mammogram in one year. (Code:SM-B-01Y) BI-RADS CATEGORY  1: Negative. Electronically Signed   By: Lajean Manes M.D.   On: 01/10/2018 07:52    Assessment & Plan:   [name] was seen today for hypertension.  Diagnoses and all orders for this visit:  Need for influenza vaccination -     Flu vaccine HIGH DOSE PF (Fluzone High dose)  Essential hypertension- Her blood pressure is not adequately well controlled on the current regimen.  I have asked her to upgrade to an ARB plus a thiazide diuretic.  She has a vague history of swelling with hydrochlorothiazide but she has not tried chlorthalidone.  I have asked her to try the combination of as losartan and chlorthalidone. -     Azilsartan-Chlorthalidone (EDARBYCLOR) 40-12.5 MG TABS; Take 1 tablet by mouth daily.   I have discontinued [name]'s VITAMIN E PO, torsemide, meloxicam, ciclopirox, and Telmisartan-amLODIPine. I am also having her start on Azilsartan-Chlorthalidone. Additionally, I am having her maintain her Vitamin D, vitamin B-12, and triamcinolone cream.  Meds ordered this encounter  Medications  . Azilsartan-Chlorthalidone (EDARBYCLOR) 40-12.5 MG TABS    Sig: Take 1 tablet by mouth daily.    Dispense:  56 tablet    Refill:  0     Follow-up: Return in about 66 weeks (around 12/23/2019).  [name], MD

## 2018-09-17 NOTE — Patient Instructions (Signed)

## 2018-11-18 ENCOUNTER — Encounter: Payer: Self-pay | Admitting: Internal Medicine

## 2018-11-18 ENCOUNTER — Other Ambulatory Visit: Payer: Medicare Other

## 2018-11-18 ENCOUNTER — Ambulatory Visit (INDEPENDENT_AMBULATORY_CARE_PROVIDER_SITE_OTHER): Payer: Medicare Other | Admitting: Internal Medicine

## 2018-11-18 VITALS — BP 170/82 | HR 74 | Temp 97.9°F | Resp 16 | Ht 63.0 in | Wt 175.0 lb

## 2018-11-18 DIAGNOSIS — L989 Disorder of the skin and subcutaneous tissue, unspecified: Secondary | ICD-10-CM

## 2018-11-18 DIAGNOSIS — L209 Atopic dermatitis, unspecified: Secondary | ICD-10-CM

## 2018-11-18 DIAGNOSIS — L308 Other specified dermatitis: Secondary | ICD-10-CM | POA: Diagnosis not present

## 2018-11-18 DIAGNOSIS — I10 Essential (primary) hypertension: Secondary | ICD-10-CM | POA: Diagnosis not present

## 2018-11-18 MED ORDER — TRIAMCINOLONE ACETONIDE 0.5 % EX CREA: TOPICAL_CREAM | Freq: Two times a day (BID) | CUTANEOUS | 1 refills | Status: DC

## 2018-11-18 MED ORDER — CHLORTHALIDONE 25 MG PO TABS: 25.0000 mg | ORAL_TABLET | Freq: Every day | ORAL | 0 refills | Status: DC

## 2018-11-18 NOTE — Patient Instructions (Signed)
Skin Biopsy, Care After  This sheet gives you information about how to care for yourself after your procedure. Your health care provider may also give you more specific instructions. If you have problems or questions, contact your health care provider.  What can I expect after the procedure?  After the procedure, it is common to have:   Soreness.   Bruising.   Itching.  Follow these instructions at home:  Biopsy site care  Follow instructions from your health care provider about how to take care of your biopsy site. Make sure you:   Wash your hands with soap and water before and after you change your bandage (dressing). If soap and water are not available, use hand sanitizer.   Apply ointment on your biopsy site as directed by your health care provider.   Change your dressing as told by your health care provider.   Leave stitches (sutures), skin glue, or adhesive strips in place. These skin closures may need to stay in place for 2 weeks or longer. If adhesive strip edges start to loosen and curl up, you may trim the loose edges. Do not remove adhesive strips completely unless your health care provider tells you to do that.   If the biopsy area bleeds, apply gentle pressure for 10 minutes.  Check your biopsy site every day for signs of infection. Check for:   Redness, swelling, or pain.   Fluid or blood.   Warmth.   Pus or a bad smell.    General instructions   Rest and then return to your normal activities as told by your health care provider.   Take over-the-counter and prescription medicines only as told by your health care provider.   Keep all follow-up visits as told by your health care provider. This is important.  Contact a health care provider if:   You have redness, swelling, or pain around your biopsy site.   You have fluid or blood coming from your biopsy site.   Your biopsy site feels warm to the touch.   You have pus or a bad smell coming from your biopsy site.   You have a  fever.   Your sutures, skin glue, or adhesive strips loosen or come off sooner than expected.  Get help right away if:   You have bleeding that does not stop with pressure or a dressing.  Summary   After the procedure, it is common to have soreness, bruising, and itching at the site.   Follow instructions from your health care provider about how to take care of your biopsy site.   Check your biopsy site every day for signs of infection.   Contact a health care provider if you have redness, swelling, or pain around your biopsy site, or your biopsy site feels warm to the touch.   Keep all follow-up visits as told by your health care provider. This is important.  This information is not intended to replace advice given to you by your health care provider. Make sure you discuss any questions you have with your health care provider.  Document Released: 11/04/2015 Document Revised: 04/07/2018 Document Reviewed: 04/07/2018  Elsevier Interactive Patient Education  2019 [employer]

## 2018-11-18 NOTE — Progress Notes (Signed)
Subjective:  Patient ID: Teresa Collins, female    DOB: July 10, 1951  Age: 68 y.o. MRN: [id number]  CC: Hypertension and Rash   HPI Teresa Collins presents for f/up -  She has a history of eczema.  For the last 2 years she has been dealing with an area on her right lower leg just above the ankle.  She says the area is intensely itchy and over the last year the pigmentation in the lesion has changed.  She has tried to treat it with topical steroids with minimal success.  Her blood pressure is not adequately well controlled.  She has not been taking Edarbyclor because she felt like it made her dizzy.  Outpatient Medications Prior to Visit  Medication Sig Dispense Refill  . Cholecalciferol (VITAMIN D) 2000 UNITS CAPS Take 1,000 Units by mouth daily.     . cyanocobalamin 500 MCG tablet Take 500 mcg by mouth daily.    . Multiple Vitamin (MULTIVITAMIN) tablet Take 1 tablet by mouth daily.    Marland Kitchen VITAMIN E PO Take by mouth.    . Azilsartan-Chlorthalidone (EDARBYCLOR) 40-12.5 MG TABS Take 1 tablet by mouth daily. 56 tablet 0  . triamcinolone cream (KENALOG) 0.5 % APPLY  CREAM TOPICALLY TO AFFECTED AREA(S) THREE TIMES DAILY 60 g 1   No facility-administered medications prior to visit.     ROS Review of Systems  Constitutional: Negative.  Negative for diaphoresis and fatigue.  HENT: Negative.   Respiratory: Negative for cough, chest tightness, shortness of breath and wheezing.   Cardiovascular: Negative for chest pain, palpitations and leg swelling.  Gastrointestinal: Negative for abdominal pain, constipation, nausea and vomiting.  Genitourinary: Negative.  Negative for difficulty urinating.  Musculoskeletal: Negative.  Negative for arthralgias and myalgias.  Skin: Positive for color change and rash.  Neurological: Negative.  Negative for dizziness, weakness, light-headedness and headaches.  Hematological: Negative for adenopathy. Does not bruise/bleed easily.  Psychiatric/Behavioral:  Negative.     Objective:  BP (!) 170/82 (BP Location: Left Arm, Patient Position: Sitting, Cuff Size: Normal)   Pulse 74   Temp 97.9 F (36.6 C) (Oral)   Resp 16   Ht 5\' 3"  (1.6 m)   Wt 175 lb (79.4 kg)   SpO2 98%   BMI 31.00 kg/m   BP Readings from Last 3 Encounters:  11/18/18 (!) 170/82  09/17/18 (!) 158/90  04/17/18 128/80    Wt Readings from Last 3 Encounters:  11/18/18 175 lb (79.4 kg)  09/17/18 170 lb 8 oz (77.3 kg)  04/17/18 175 lb 8 oz (79.6 kg)    Physical Exam Constitutional:      Appearance: She is not ill-appearing or diaphoretic.  HENT:     Nose: Nose normal. No congestion or rhinorrhea.     Mouth/Throat:     Mouth: Mucous membranes are moist.     Pharynx: No oropharyngeal exudate or posterior oropharyngeal erythema.  Eyes:     General: No scleral icterus.    Conjunctiva/sclera: Conjunctivae normal.  Neck:     Musculoskeletal: Normal range of motion and neck supple. No muscular tenderness.  Cardiovascular:     Rate and Rhythm: Normal rate and regular rhythm.     Heart sounds: No murmur. No gallop.   Pulmonary:     Effort: Pulmonary effort is normal.     Breath sounds: No stridor. No wheezing or rhonchi.  Abdominal:     General: Abdomen is flat. Bowel sounds are normal.     Palpations: Abdomen is soft.  Musculoskeletal: Normal range of motion.        General: No swelling.     Right lower leg: No edema.     Left lower leg: No edema.       Legs:     Comments: Biopsy-the area was cleaned with Betadine and prepped and draped in sterile fashion.  Anesthesia was obtained with the instillation of 1.5 cc of 1% lidocaine with epi.  A 4 mm punch incision was made and the specimen sent for pathology.  The wound was closed using 1 simple interrupted suture with 4-0 nylon on a PC 3.  Hemostasis was obtained and she tolerated the procedure well.  Triple antibiotic and a dressing were applied.  Skin:    General: Skin is warm and dry.  Neurological:     General:  No focal deficit present.     Mental Status: She is oriented to person, place, and time. Mental status is at baseline.     Lab Results  Component Value Date   WBC 4.5 04/17/2018   HGB 13.4 04/17/2018   HCT 39.4 04/17/2018   PLT 179.0 04/17/2018   GLUCOSE 109 (H) 04/17/2018   CHOL 218 (H) 04/17/2018   TRIG 142.0 04/17/2018   HDL 51.50 04/17/2018   LDLDIRECT 166.8 03/31/2013   LDLCALC 138 (H) 04/17/2018   ALT 13 04/17/2018   AST 16 04/17/2018   NA 140 04/17/2018   K 3.8 04/17/2018   CL 104 04/17/2018   CREATININE 1.12 04/17/2018   BUN 17 04/17/2018   CO2 30 04/17/2018   TSH 0.52 04/17/2018   HGBA1C 5.8 12/19/2016    Mm Screening Breast Tomo Bilateral  Result Date: 01/10/2018 CLINICAL DATA:  Screening. EXAM: DIGITAL SCREENING BILATERAL MAMMOGRAM WITH TOMO AND CAD COMPARISON:  Previous exam(s). ACR Breast Density Category c: The breast tissue is heterogeneously dense, which may obscure small masses. FINDINGS: There are no findings suspicious for malignancy. Images were processed with CAD. IMPRESSION: No mammographic evidence of malignancy. A result letter of this screening mammogram will be mailed directly to the patient. RECOMMENDATION: Screening mammogram in one year. (Code:SM-B-01Y) BI-RADS CATEGORY  1: Negative. Electronically Signed   By: Lajean Manes M.D.   On: 01/10/2018 07:52    Assessment & Plan:   Teresa Collins was seen today for hypertension and rash.  Diagnoses and all orders for this visit:  Essential hypertension- Her blood pressure is not adequately well controlled.  I think the ARB may have caused the dizziness so I have asked her to control her blood pressure with the thiazide diuretic. -     Basic metabolic panel; Future -     chlorthalidone (HYGROTON) 25 MG tablet; Take 1 tablet (25 mg total) by mouth daily.  Atopic eczema -     triamcinolone cream (KENALOG) 0.5 %; Apply topically 2 (two) times daily.  Skin lesion of right lower limb- biopsy performed and  sent for pathology to try to identify the cause for this lesion.  I want to confirm that this is eczema not something else like squamous cell carcinoma. -     Cancel: Dermatology pathology -     Dermatology pathology; Future   I have discontinued Jordyan Hackmann's Azilsartan-Chlorthalidone. I have also changed her triamcinolone cream. Additionally, I am having her start on chlorthalidone. Lastly, I am having her maintain her Vitamin D, vitamin B-12, VITAMIN E PO, and multivitamin.  Meds ordered this encounter  Medications  . triamcinolone cream (KENALOG) 0.5 %    Sig: Apply  topically 2 (two) times daily.    Dispense:  60 g    Refill:  1    Please consider 90 day supplies to promote better adherence  . chlorthalidone (HYGROTON) 25 MG tablet    Sig: Take 1 tablet (25 mg total) by mouth daily.    Dispense:  90 tablet    Refill:  0     Follow-up: Return in about 1 week (around 11/25/2018).  [name], MD

## 2018-11-21 ENCOUNTER — Encounter: Payer: Self-pay | Admitting: Internal Medicine

## 2018-11-25 ENCOUNTER — Ambulatory Visit (INDEPENDENT_AMBULATORY_CARE_PROVIDER_SITE_OTHER): Payer: Medicare Other | Admitting: Internal Medicine

## 2018-11-25 ENCOUNTER — Encounter: Payer: Self-pay | Admitting: Internal Medicine

## 2018-11-25 VITALS — BP 160/90 | HR 64 | Temp 98.5°F | Resp 16 | Ht 63.0 in | Wt 170.0 lb

## 2018-11-25 DIAGNOSIS — L280 Lichen simplex chronicus: Secondary | ICD-10-CM

## 2018-11-25 DIAGNOSIS — L2084 Intrinsic (allergic) eczema: Secondary | ICD-10-CM | POA: Diagnosis not present

## 2018-11-25 DIAGNOSIS — I10 Essential (primary) hypertension: Secondary | ICD-10-CM

## 2018-11-25 MED ORDER — LEVOCETIRIZINE DIHYDROCHLORIDE 5 MG PO TABS: 5.0000 mg | ORAL_TABLET | Freq: Every evening | ORAL | 1 refills | Status: DC

## 2018-11-25 MED ORDER — CLOBETASOL PROPIONATE 0.05 % EX OINT: 1.0000 "application " | TOPICAL_OINTMENT | Freq: Two times a day (BID) | CUTANEOUS | 1 refills | Status: DC

## 2018-11-25 NOTE — Patient Instructions (Signed)

## 2018-11-25 NOTE — Progress Notes (Signed)
Subjective:  Patient ID: Teresa Collins, female    DOB: 1951-02-01  Age: 68 y.o. MRN: [id number]  CC: Hypertension and Rash   HPI Klare Criss presents for f/up -she underwent a biopsy of a rash on her right lower leg about a week ago.  She returns to have the suture removed.  The biopsy specimen is consistent with lichen simplex chronicus and eczema. She is not getting much symptom relief with her current topical steroid.  She tells me her blood pressure is controlled at home.  Her numbers at home are usually 120-130/76-80.  She thinks her high blood pressure in the office is the whitecoat phenomenon.  Outpatient Medications Prior to Visit  Medication Sig Dispense Refill  . chlorthalidone (HYGROTON) 25 MG tablet Take 1 tablet (25 mg total) by mouth daily. 90 tablet 0  . Cholecalciferol (VITAMIN D) 2000 UNITS CAPS Take 1,000 Units by mouth daily.     . cyanocobalamin 500 MCG tablet Take 500 mcg by mouth daily.    . Multiple Vitamin (MULTIVITAMIN) tablet Take 1 tablet by mouth daily.    Marland Kitchen VITAMIN E PO Take by mouth.    . triamcinolone cream (KENALOG) 0.5 % Apply topically 2 (two) times daily. 60 g 1   No facility-administered medications prior to visit.     ROS Review of Systems  Constitutional: Negative.  Negative for appetite change, diaphoresis, fatigue and fever.  HENT: Negative.   Eyes: Negative for visual disturbance.  Respiratory: Negative for cough, chest tightness, shortness of breath and wheezing.   Cardiovascular: Negative for chest pain and leg swelling.  Gastrointestinal: Negative for abdominal pain, constipation, diarrhea, nausea and vomiting.  Genitourinary: Negative.  Negative for difficulty urinating.  Musculoskeletal: Negative.  Negative for arthralgias and myalgias.  Skin: Positive for color change and rash. Negative for pallor and wound.  Neurological: Negative.  Negative for dizziness, weakness, light-headedness and headaches.  Hematological: Negative  for adenopathy. Does not bruise/bleed easily.  Psychiatric/Behavioral: Negative.     Objective:  BP (!) 160/90 (BP Location: Left Arm, Patient Position: Sitting, Cuff Size: Normal)   Pulse 64   Temp 98.5 F (36.9 C) (Oral)   Resp 16   Ht 5\' 3"  (1.6 m)   Wt 170 lb (77.1 kg)   SpO2 99%   BMI 30.11 kg/m   BP Readings from Last 3 Encounters:  11/25/18 (!) 160/90  11/18/18 (!) 170/82  09/17/18 (!) 158/90    Wt Readings from Last 3 Encounters:  11/25/18 170 lb (77.1 kg)  11/18/18 175 lb (79.4 kg)  09/17/18 170 lb 8 oz (77.3 kg)    Physical Exam Vitals signs reviewed.  Constitutional:      Appearance: She is obese. She is not ill-appearing or diaphoretic.  HENT:     Nose: Nose normal. No congestion or rhinorrhea.     Mouth/Throat:     Mouth: Mucous membranes are moist.     Pharynx: Oropharynx is clear. No oropharyngeal exudate or posterior oropharyngeal erythema.  Eyes:     General: No scleral icterus.    Conjunctiva/sclera: Conjunctivae normal.  Neck:     Musculoskeletal: Normal range of motion and neck supple. No neck rigidity or muscular tenderness.  Cardiovascular:     Rate and Rhythm: Normal rate and regular rhythm.     Heart sounds: No murmur. No gallop.   Pulmonary:     Effort: Pulmonary effort is normal.     Breath sounds: No stridor. No wheezing, rhonchi or rales.  Abdominal:  General: Abdomen is flat. Bowel sounds are normal.     Palpations: There is no mass.     Tenderness: There is no abdominal tenderness.  Musculoskeletal: Normal range of motion.        General: No swelling.     Right lower leg: No edema.     Left lower leg: No edema.  Lymphadenopathy:     Cervical: No cervical adenopathy.  Skin:    Findings: Rash present.     Comments: Suture removed from the biopsy site over her right lower leg.  The rash is unchanged.  There is a central area of pale scar surrounded by a slightly raised, hyperpigmented epidermis.  Neurological:     General: No  focal deficit present.     Mental Status: She is oriented to person, place, and time. Mental status is at baseline.     Lab Results  Component Value Date   WBC 4.5 04/17/2018   HGB 13.4 04/17/2018   HCT 39.4 04/17/2018   PLT 179.0 04/17/2018   GLUCOSE 109 (H) 04/17/2018   CHOL 218 (H) 04/17/2018   TRIG 142.0 04/17/2018   HDL 51.50 04/17/2018   LDLDIRECT 166.8 03/31/2013   LDLCALC 138 (H) 04/17/2018   ALT 13 04/17/2018   AST 16 04/17/2018   NA 140 04/17/2018   K 3.8 04/17/2018   CL 104 04/17/2018   CREATININE 1.12 04/17/2018   BUN 17 04/17/2018   CO2 30 04/17/2018   TSH 0.52 04/17/2018   HGBA1C 5.8 12/19/2016    Mm Screening Breast Tomo Bilateral  Result Date: 01/10/2018 CLINICAL DATA:  Screening. EXAM: DIGITAL SCREENING BILATERAL MAMMOGRAM WITH TOMO AND CAD COMPARISON:  Previous exam(s). ACR Breast Density Category c: The breast tissue is heterogeneously dense, which may obscure small masses. FINDINGS: There are no findings suspicious for malignancy. Images were processed with CAD. IMPRESSION: No mammographic evidence of malignancy. A result letter of this screening mammogram will be mailed directly to the patient. RECOMMENDATION: Screening mammogram in one year. (Code:SM-B-01Y) BI-RADS CATEGORY  1: Negative. Electronically Signed   By: Lajean Manes M.D.   On: 01/10/2018 07:52    Skin , lower right RLE CHRONIC SPONGIOTIC DERMATITIS, SEE DESCRIPTION Microscopic Description There is acanthosis with foci of slight spongiosis and parakeratosis. An infiltrate composed predominantly of lymphocytes is present around the superficial vascular plexus. A PAS stain is negative for fungi. The findings are most consistent with a chronic eczematous dermatitis such as contact, nummular or atopic dermatitis. There are superimposed changes of lichen simplex chronicus. Terisa Starr. Augustin Coupe MD Dermatopathologist, Electronic Signature (Case signed 11/20/2018) Specimen Gross and Clinical  Information Clinical History Interpret all cells seen Clinical Diagnosis Skin lesion of right limb SPECIMEN(S) OBTAINED Skin , lower right RLE GROSS DESCRIPTION   Assessment & Plan:   Maley was seen today for hypertension and rash.  Diagnoses and all orders for this visit:  Intrinsic atopic dermatitis- See below -     clobetasol ointment (TEMOVATE) 0.05 %; Apply 1 application topically 2 (two) times daily. -     levocetirizine (XYZAL) 5 MG tablet; Take 1 tablet (5 mg total) by mouth every evening.  LSC (lichen simplex chronicus)- Will treat her with a more potent topical steroid and an oral antihistamine to try to prevent the urge to scratch and rub the area. -     clobetasol ointment (TEMOVATE) 0.05 %; Apply 1 application topically 2 (two) times daily. -     levocetirizine (XYZAL) 5 MG tablet; Take 1 tablet (  5 mg total) by mouth every evening.  Essential hypertension- Her systolic and diastolic blood pressures are elevated today but it sounds like this is a whitecoat phenomenon.  For now,  will continue the current antihypertensive regimen of a thiazide diuretic.   I have discontinued Makhayla Pickup's triamcinolone cream. I am also having her start on clobetasol ointment and levocetirizine. Additionally, I am having her maintain her Vitamin D, vitamin B-12, VITAMIN E PO, multivitamin, and chlorthalidone.  Meds ordered this encounter  Medications  . clobetasol ointment (TEMOVATE) 0.05 %    Sig: Apply 1 application topically 2 (two) times daily.    Dispense:  60 g    Refill:  1  . levocetirizine (XYZAL) 5 MG tablet    Sig: Take 1 tablet (5 mg total) by mouth every evening.    Dispense:  90 tablet    Refill:  1     Follow-up: Return in about 3 months (around 02/23/2019).  [name], MD

## 2018-11-27 ENCOUNTER — Encounter: Payer: Self-pay | Admitting: Internal Medicine

## 2018-12-04 ENCOUNTER — Emergency Department (HOSPITAL_COMMUNITY): Payer: Medicare Other

## 2018-12-04 ENCOUNTER — Observation Stay (HOSPITAL_COMMUNITY)
Admission: EM | Admit: 2018-12-04 | Discharge: 2018-12-05 | Disposition: A | Discharge status: Home / Self Care | Payer: Medicare Other | Attending: Internal Medicine | Admitting: Internal Medicine

## 2018-12-04 ENCOUNTER — Ambulatory Visit: Payer: Self-pay | Admitting: *Deleted

## 2018-12-04 ENCOUNTER — Other Ambulatory Visit: Payer: Self-pay

## 2018-12-04 ENCOUNTER — Encounter (HOSPITAL_COMMUNITY): Payer: Self-pay

## 2018-12-04 DIAGNOSIS — K219 Gastro-esophageal reflux disease without esophagitis: Secondary | ICD-10-CM | POA: Insufficient documentation

## 2018-12-04 DIAGNOSIS — Z91040 Latex allergy status: Secondary | ICD-10-CM | POA: Insufficient documentation

## 2018-12-04 DIAGNOSIS — I10 Essential (primary) hypertension: Secondary | ICD-10-CM | POA: Diagnosis not present

## 2018-12-04 DIAGNOSIS — Z79899 Other long term (current) drug therapy: Secondary | ICD-10-CM | POA: Diagnosis not present

## 2018-12-04 DIAGNOSIS — I160 Hypertensive urgency: Principal | ICD-10-CM | POA: Insufficient documentation

## 2018-12-04 DIAGNOSIS — L280 Lichen simplex chronicus: Secondary | ICD-10-CM | POA: Diagnosis present

## 2018-12-04 DIAGNOSIS — Z8553 Personal history of malignant neoplasm of renal pelvis: Secondary | ICD-10-CM | POA: Diagnosis not present

## 2018-12-04 DIAGNOSIS — R51 Headache: Secondary | ICD-10-CM | POA: Insufficient documentation

## 2018-12-04 LAB — CBC WITH DIFFERENTIAL/PLATELET
Abs Immature Granulocytes: 0.02 10*3/uL (ref 0.00–0.07)
BASOS ABS: 0 10*3/uL (ref 0.0–0.1)
Basophils Relative: 1 %
Eosinophils Absolute: 0.1 10*3/uL (ref 0.0–0.5)
Eosinophils Relative: 2 %
HCT: 43.7 % (ref 36.0–46.0)
HEMOGLOBIN: 14.2 g/dL (ref 12.0–15.0)
Immature Granulocytes: 0 %
LYMPHS PCT: 34 %
Lymphs Abs: 1.7 10*3/uL (ref 0.7–4.0)
MCH: 30.7 pg (ref 26.0–34.0)
MCHC: 32.5 g/dL (ref 30.0–36.0)
MCV: 94.4 fL (ref 80.0–100.0)
Monocytes Absolute: 0.6 10*3/uL (ref 0.1–1.0)
Monocytes Relative: 12 %
NEUTROS ABS: 2.6 10*3/uL (ref 1.7–7.7)
Neutrophils Relative %: 51 %
Platelets: 164 10*3/uL (ref 150–400)
RBC: 4.63 MIL/uL (ref 3.87–5.11)
RDW: 14.3 % (ref 11.5–15.5)
WBC: 5.1 10*3/uL (ref 4.0–10.5)
nRBC: 0 % (ref 0.0–0.2)

## 2018-12-04 LAB — BASIC METABOLIC PANEL
Anion gap: 11 (ref 5–15)
BUN: 19 mg/dL (ref 8–23)
CO2: 27 mmol/L (ref 22–32)
Calcium: 10.3 mg/dL (ref 8.9–10.3)
Chloride: 100 mmol/L (ref 98–111)
Creatinine, Ser: 1.26 mg/dL — ABNORMAL HIGH (ref 0.44–1.00)
GFR calc Af Amer: 51 mL/min — ABNORMAL LOW (ref >60)
GFR calc non Af Amer: 44 mL/min — ABNORMAL LOW (ref >60)
Glucose, Bld: 98 mg/dL (ref 70–99)
Potassium: 3.4 mmol/L — ABNORMAL LOW (ref 3.5–5.1)
Sodium: 138 mmol/L (ref 135–145)

## 2018-12-04 LAB — I-STAT TROPONIN, ED: Troponin i, poc: 0 ng/mL (ref 0.00–0.08)

## 2018-12-04 MED ORDER — SODIUM CHLORIDE 0.9 % IV BOLUS
500.0000 mL | Freq: Once | INTRAVENOUS | Status: AC
  Administered 2018-12-04: 500 mL via INTRAVENOUS

## 2018-12-04 MED ORDER — SODIUM CHLORIDE 0.9 % IV SOLN: 250.0000 mL | INTRAVENOUS | Status: DC | PRN

## 2018-12-04 MED ORDER — SODIUM CHLORIDE 0.9% FLUSH: 3.0000 mL | INTRAVENOUS | Status: DC | PRN

## 2018-12-04 MED ORDER — CHLORTHALIDONE 25 MG PO TABS
25.0000 mg | ORAL_TABLET | Freq: Every day | ORAL | Status: DC
  Administered 2018-12-05: 25 mg via ORAL
  Filled 2018-12-04: qty 1

## 2018-12-04 MED ORDER — ACETAMINOPHEN 650 MG RE SUPP: 650.0000 mg | Freq: Four times a day (QID) | RECTAL | Status: DC | PRN

## 2018-12-04 MED ORDER — VITAMIN D 25 MCG (1000 UNIT) PO TABS
5000.0000 [IU] | ORAL_TABLET | Freq: Every day | ORAL | Status: DC
  Administered 2018-12-05: 5000 [IU] via ORAL
  Filled 2018-12-04: qty 5

## 2018-12-04 MED ORDER — VITAMIN B-12 1000 MCG PO TABS
500.0000 ug | ORAL_TABLET | Freq: Every day | ORAL | Status: DC
  Administered 2018-12-05: 500 ug via ORAL
  Filled 2018-12-04: qty 1

## 2018-12-04 MED ORDER — HYDRALAZINE HCL 25 MG PO TABS
25.0000 mg | ORAL_TABLET | Freq: Four times a day (QID) | ORAL | Status: DC
  Administered 2018-12-04 – 2018-12-05 (×3): 25 mg via ORAL
  Filled 2018-12-04 (×4): qty 1

## 2018-12-04 MED ORDER — ADULT MULTIVITAMIN W/MINERALS CH
1.0000 | ORAL_TABLET | Freq: Every day | ORAL | Status: DC
  Administered 2018-12-05: 1 via ORAL
  Filled 2018-12-04: qty 1

## 2018-12-04 MED ORDER — ACETAMINOPHEN 325 MG PO TABS: 650.0000 mg | ORAL_TABLET | Freq: Four times a day (QID) | ORAL | Status: DC | PRN

## 2018-12-04 MED ORDER — SODIUM CHLORIDE 0.9% FLUSH
3.0000 mL | Freq: Two times a day (BID) | INTRAVENOUS | Status: DC
  Administered 2018-12-05: 3 mL via INTRAVENOUS

## 2018-12-04 MED ORDER — HYDRALAZINE HCL 10 MG PO TABS
20.0000 mg | ORAL_TABLET | Freq: Once | ORAL | Status: AC
  Administered 2018-12-04: 20 mg via ORAL
  Filled 2018-12-04: qty 2

## 2018-12-04 MED ORDER — IBUPROFEN 200 MG PO TABS: 600.0000 mg | ORAL_TABLET | Freq: Four times a day (QID) | ORAL | Status: DC | PRN

## 2018-12-04 MED ORDER — HYDRALAZINE HCL 10 MG PO TABS
5.0000 mg | ORAL_TABLET | Freq: Once | ORAL | Status: AC
  Administered 2018-12-04: 5 mg via ORAL
  Filled 2018-12-04: qty 1

## 2018-12-04 MED ORDER — ONDANSETRON HCL 4 MG/2ML IJ SOLN: 4.0000 mg | Freq: Four times a day (QID) | INTRAMUSCULAR | Status: DC | PRN

## 2018-12-04 MED ORDER — ONDANSETRON HCL 4 MG PO TABS: 4.0000 mg | ORAL_TABLET | Freq: Four times a day (QID) | ORAL | Status: DC | PRN

## 2018-12-04 NOTE — H&P (Signed)
History and Physical    [name] DXA:[id number] DOB: 1950/12/04 DOA: 12/04/2018  PCP: Janith Lima, MD  Patient coming from: Home  Chief Complaint: High blood pressure  HPI: Teresa Collins is a 68 y.o. female with medical history significant of hypertension comes in with uncontrolled blood pressure at home with associated headache.  Patient been having headache for weeks.  She saw her PCP couple weeks ago started her on chlorthalidone 25 mg which she has been taking.  She is taking her blood pressure several times at home today persistently have been above 209 systolic.  She denies any focal neurological deficits.  She denies any chest pain.  She denies any shortness of breath.  She denies any lower extremity edema or swelling.  She denies any numbness tingling or weakness anywhere.  Emergency department PA is given her 5 mg of hydralazine IV for systolic blood pressure of 200.  I have advised her to give her more is been requested to admit the patient for blood pressure control.  Review of Systems: As per HPI otherwise 10 point review of systems negative.   Past Medical History:  Diagnosis Date  . Arthritis   . ASCUS favor benign 12/2015   negative high risk HPV. Recommend repeat Pap smear in one year  . Eczema   . GERD (gastroesophageal reflux disease)   . Hx of colonic polyps   . Hyperlipidemia   . Hypertension   . Incisional hernia   . Numbness and tingling    RT THIGH  . Renal cancer ([place]) 2015   Left  . STD (sexually transmitted disease)    GC    Past Surgical History:  Procedure Laterality Date  . abortions    . CARPAL TUNNEL RELEASE Left 2017  . CESAREAN SECTION     1 time  . INSERTION OF MESH N/A 07/21/2014   Procedure: INSERTION OF MESH;  Surgeon: Coralie Keens, MD;  Location: [facility];  Service: General;  Laterality: N/A;  . MYOMECTOMY ABDOMINAL APPROACH    . ROBOTIC ASSITED PARTIAL NEPHRECTOMY Left 07/21/2014   Procedure: ROBOTIC ASSISTED LEFT PARTIAL  NEPHRECTOMY;  Surgeon: Ardis Hughs, MD;  Location: [facility];  Service: Urology;  Laterality: Left;  . TUBAL LIGATION    . VENTRAL HERNIA REPAIR N/A 07/21/2014   Procedure: INCISIONAL HERNIA REPAIR WITH MESH ;  Surgeon: Coralie Keens, MD;  Location: [facility];  Service: General;  Laterality: N/A;     reports that she has never smoked. She has never used smokeless tobacco. She reports current alcohol use. She reports current drug use. Drug: Marijuana.  Allergies  Allergen Reactions  . Lipitor [Atorvastatin] Other (See Comments)    Muscle aches  . Bystolic [Nebivolol Hcl] Swelling  . Codeine Sulfate Hives    REACTION: rash and itching  . Latex Rash    Family History  Problem Relation Age of Onset  . Hypertension Sister   . Hypertension Brother   . Colon cancer Neg Hx   . Esophageal cancer Neg Hx   . Rectal cancer Neg Hx   . Stomach cancer Neg Hx     Prior to Admission medications   Medication Sig Start Date End Date Taking? Authorizing Provider  chlorthalidone (HYGROTON) 25 MG tablet Take 1 tablet (25 mg total) by mouth daily. 11/18/18  Yes Janith Lima, MD  Cholecalciferol (VITAMIN D3) 125 MCG (5000 UT) TABS Take 1 tablet by mouth daily.   Yes [provider]  clobetasol ointment (TEMOVATE) 0.05 %  Apply 1 application topically 2 (two) times daily. 11/25/18  Yes Janith Lima, MD  cyanocobalamin 500 MCG tablet Take 500 mcg by mouth daily.   Yes [provider]  levocetirizine (XYZAL) 5 MG tablet Take 1 tablet (5 mg total) by mouth every evening. Patient taking differently: Take 5 mg by mouth daily as needed for allergies (eczema).  11/25/18  Yes Janith Lima, MD  Multiple Vitamin (MULTIVITAMIN) tablet Take 1 tablet by mouth daily.   Yes [provider]  VITAMIN E PO Take 1 tablet by mouth daily.    Yes [provider]    Physical Exam: Vitals:   12/04/18 2105 12/04/18 2130 12/04/18 2200 12/04/18 2230  BP: (!) 210/89 (!) 190/90 (!)  167/79 (!) 177/80  Pulse:  (!) 57 65 63  Resp:  12 12 13   Temp:      TempSrc:      SpO2:  100% 100% 100%  Weight:      Height:          Constitutional: NAD, calm, comfortable Vitals:   12/04/18 2105 12/04/18 2130 12/04/18 2200 12/04/18 2230  BP: (!) 210/89 (!) 190/90 (!) 167/79 (!) 177/80  Pulse:  (!) 57 65 63  Resp:  12 12 13   Temp:      TempSrc:      SpO2:  100% 100% 100%  Weight:      Height:       Eyes: PERRL, lids and conjunctivae normal ENMT: Mucous membranes are moist. Posterior pharynx clear of any exudate or lesions.Normal dentition.  Neck: normal, supple, no masses, no thyromegaly Respiratory: clear to auscultation bilaterally, no wheezing, no crackles. Normal respiratory effort. No accessory muscle use.  Cardiovascular: Regular rate and rhythm, no murmurs / rubs / gallops. No extremity edema. 2+ pedal pulses. No carotid bruits.  Abdomen: no tenderness, no masses palpated. No hepatosplenomegaly. Bowel sounds positive.  Musculoskeletal: no clubbing / cyanosis. No joint deformity upper and lower extremities. Good ROM, no contractures. Normal muscle tone.  Skin: no rashes, lesions, ulcers. No induration Neurologic: CN 2-12 grossly intact. Sensation intact, DTR normal. Strength 5/5 in all 4.  Psychiatric: Normal judgment and insight. Alert and oriented x 3. Normal mood.    Labs on Admission: I have personally reviewed following labs and imaging studies  CBC: Recent Labs  Lab 12/04/18 1826  WBC 5.1  NEUTROABS 2.6  HGB 14.2  HCT 43.7  MCV 94.4  PLT 193   Basic Metabolic Panel: Recent Labs  Lab 12/04/18 1826  NA 138  K 3.4*  CL 100  CO2 27  GLUCOSE 98  BUN 19  CREATININE 1.26*  CALCIUM 10.3   GFR: Estimated Creatinine Clearance: 42.6 mL/min (A) (by C-G formula based on SCr of 1.26 mg/dL (H)). Liver Function Tests: No results for input(s): AST, ALT, ALKPHOS, BILITOT, PROT, ALBUMIN in the last 168 hours. No results for input(s): LIPASE, AMYLASE in  the last 168 hours. No results for input(s): AMMONIA in the last 168 hours. Coagulation Profile: No results for input(s): INR, PROTIME in the last 168 hours. Cardiac Enzymes: No results for input(s): CKTOTAL, CKMB, CKMBINDEX, TROPONINI in the last 168 hours. BNP (last 3 results) No results for input(s): PROBNP in the last 8760 hours. HbA1C: No results for input(s): HGBA1C in the last 72 hours. CBG: No results for input(s): GLUCAP in the last 168 hours. Lipid Profile: No results for input(s): CHOL, HDL, LDLCALC, TRIG, CHOLHDL, LDLDIRECT in the last 72 hours. Thyroid Function Tests:  No results for input(s): TSH, T4TOTAL, FREET4, T3FREE, THYROIDAB in the last 72 hours. Anemia Panel: No results for input(s): VITAMINB12, FOLATE, FERRITIN, TIBC, IRON, RETICCTPCT in the last 72 hours. Urine analysis:    Component Value Date/Time   COLORURINE YELLOW 04/17/2018 1003   APPEARANCEUR CLEAR 04/17/2018 1003   LABSPEC 1.025 04/17/2018 1003   PHURINE 5.5 04/17/2018 1003   GLUCOSEU NEGATIVE 04/17/2018 1003   HGBUR NEGATIVE 04/17/2018 1003   [place] 04/17/2018 1003   KETONESUR NEGATIVE 04/17/2018 1003   PROTEINUR 30 (A) 10/31/2013 1107   UROBILINOGEN 0.2 04/17/2018 1003   NITRITE NEGATIVE 04/17/2018 1003   LEUKOCYTESUR NEGATIVE 04/17/2018 1003   Sepsis Labs: !!!!!!!!!!!!!!!!!!!!!!!!!!!!!!!!!!!!!!!!!!!! @LABRCNTIP (procalcitonin:4,lacticidven:4) )No results found for this or any previous visit (from the past 240 hour(s)).   Radiological Exams on Admission: Dg Chest 2 View  Result Date: 12/04/2018 CLINICAL DATA:  Hypertension.  Shortness of breath. EXAM: CHEST - 2 VIEW COMPARISON:  07/10/2016 FINDINGS: The heart size and mediastinal contours are within normal limits. Both lungs are clear. The visualized skeletal structures are unremarkable. IMPRESSION: No active cardiopulmonary disease. Electronically Signed   By: Marin Olp M.D.   On: 12/04/2018 18:15   Ct Head Wo  Contrast  Result Date: 12/04/2018 CLINICAL DATA:  Hypertension with headache and blurry vision EXAM: CT HEAD WITHOUT CONTRAST TECHNIQUE: Contiguous axial images were obtained from the base of the skull through the vertex without intravenous contrast. COMPARISON:  None. FINDINGS: Brain: No evidence of acute infarction, hemorrhage, hydrocephalus, extra-axial collection or mass lesion/mass effect. Right basal ganglial calcifications are noted that can be seen in the normal aging process. Vascular: No hyperdense vessel or unexpected calcification. Skull: Normal. Negative for fracture or focal lesion. Sinuses/Orbits: No acute finding. Other: None. IMPRESSION: Normal head CT. Electronically Signed   By: Ashley Royalty M.D.   On: 12/04/2018 20:53    EKG: Independently reviewed.  Normal sinus rhythm no acute changes Old chart reviewed Case discussed with the ED PA  Assessment/Plan 68 year old female with uncontrolled hypertension Principal Problem:   Hypertension-start on oral hydralazine.  Patient given 20 mg of hydralazine the ED.  Blood pressures systolic down to the 116 range and headache is better.  Observe overnight and discharge tomorrow on more oral medication.  Active Problems:   GERD-stable   LSC (lichen simplex chronicus)-stable     DVT prophylaxis: SCDs Code Status: Full Family Communication: None Disposition Plan: Tomorrow Consults called: None Admission status: Observation   Hartford Maulden A MD [facility]  If 7PM-7AM, please contact night-coverage [email or web address] Password [facility]  12/04/2018, 10:46 PM

## 2018-12-04 NOTE — Telephone Encounter (Signed)
Message from Teresa Collins sent at 12/04/2018 2:33 PM EST   Patient is calling because her BP has been elevated-today it is 170/88. Patient wanting to know does she need to come in and see Dr. Ronnald Ramp Please advise thank you    Called patient back. She had several b/p readings for the day where her b/p was elevated.  She stated that it has been going up for a while . Medication was changed about 2 weeks ago. She was trying to hold out until 4 weeks but could not. She is having blurred vision, headache, feeling very warm and the cool. And not feeling well when she gets warm. She also has had some shortness of breath. Her readings for today are: 11am  176/101,  11:15am  173/99,  12:30 155/88  And  170/88 15 mins ago. Last checked it was 187/111 HR 74. Advised to go to an ED, per protocol.  She is going to [facility].  Routing to [facility] at [place] for review.  Reason for Disposition . [2] Systolic BP  >= 841 OR Diastolic >= 324 AND [4] cardiac or neurologic symptoms (e.g., chest pain, difficulty breathing, unsteady gait, blurred vision)  Answer Assessment - Initial Assessment Questions 1. BLOOD PRESSURE: "What is the blood pressure?" "Did you take at least two measurements 5 minutes apart?"     170/88  And 187/111 HR 74 2. ONSET: "When did you take your blood pressure?"     Right now 3. HOW: "How did you obtain the blood pressure?" (e.g., visiting nurse, automatic home BP monitor)     Automatic home BP monitor  4. HISTORY: "Do you have a history of high blood pressure?"     yes 5. MEDICATIONS: "Are you taking any medications for blood pressure?" "Have you missed any doses recently?"      Have not missed any medications 6. OTHER SYMPTOMS: "Do you have any symptoms?" (e.g., headache, chest pain, blurred vision, difficulty breathing, weakness)     Headache, blurred vision 7. PREGNANCY: "Is there any chance you are pregnant?" "When was your last menstrual period?"     n/a  Protocols used: HIGH  BLOOD PRESSURE-A-AH

## 2018-12-04 NOTE — ED Provider Notes (Signed)
Teresa Collins Provider Note   CSN: [id number] Arrival date & time: 12/04/18  1521     History   Chief Complaint Chief Complaint  Patient presents with  . Hypertension    HPI Teresa Collins is a 68 y.o. female.  67 y.o female with a PMH of GERD, HTN, HLD presents to the ED with a chief complaint of elevated blood pressure x 2 weeks. Patient reports her PCP has been changing her medication, reports last time she was on a medication that caused her to be dizzy and lightheaded.  Reports measuring her blood pressure multiple times yesterday and today and having elevated readings with the systolic number being on the upper 606T and diastolic number on the low 100s.  Also endorses headache, reports some pressure across her eyes along with some blurry vision this morning.  She states "I have not been feeling well at all, I have tried to eat today but he just did not seem like I could keep down food ".  She also endorses some chest pain describing it as stiffness across the center of her chest with radiation to the left side, does report increase in belching along with shortness of breath.She denies any fever, abdominal pain, or urinary symptoms.      Past Medical History:  Diagnosis Date  . Arthritis   . ASCUS favor benign 12/2015   negative high risk HPV. Recommend repeat Pap smear in one year  . Eczema   . GERD (gastroesophageal reflux disease)   . Hx of colonic polyps   . Hyperlipidemia   . Hypertension   . Incisional hernia   . Numbness and tingling    RT THIGH  . Renal cancer ([place]) 2015   Left  . STD (sexually transmitted disease)    GC    Patient Active Problem List   Diagnosis Date Noted  . Hypertension 12/04/2018  . 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  . Skin lesion of right lower limb 11/18/2018  . IBS (irritable bowel syndrome) 12/06/2015  . 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  . Visit for screening mammogram  11/29/2015  . Atopic eczema 11/29/2015  . Renal cell carcinoma ([place]) 05/27/2014  . Hyperlipidemia with target LDL less than 130 08/13/2013  . Routine general medical examination at a health care facility 03/31/2013  . OA (osteoarthritis) 01/17/2012  . Vitamin D deficiency 11/01/2010  . GERD 10/12/2008  . Essential hypertension 10/11/2008    Past Surgical History:  Procedure Laterality Date  . abortions    . CARPAL TUNNEL RELEASE Left 2017  . CESAREAN SECTION     1 time  . INSERTION OF MESH N/A 07/21/2014   Procedure: INSERTION OF MESH;  Surgeon: Coralie Keens, MD;  Location: [facility];  Service: General;  Laterality: N/A;  . MYOMECTOMY ABDOMINAL APPROACH    . ROBOTIC ASSITED PARTIAL NEPHRECTOMY Left 07/21/2014   Procedure: ROBOTIC ASSISTED LEFT PARTIAL NEPHRECTOMY;  Surgeon: Ardis Hughs, MD;  Location: [facility];  Service: Urology;  Laterality: Left;  . TUBAL LIGATION    . VENTRAL HERNIA REPAIR N/A 07/21/2014   Procedure: INCISIONAL HERNIA REPAIR WITH MESH ;  Surgeon: Coralie Keens, MD;  Location: [facility];  Service: General;  Laterality: N/A;     OB History    Gravida  3   Para  1   Term      Preterm      AB  2   Living  1  SAB      TAB      Ectopic      Multiple      Live Births               Home Medications    Prior to Admission medications   Medication Sig Start Date End Date Taking? Authorizing Provider  chlorthalidone (HYGROTON) 25 MG tablet Take 1 tablet (25 mg total) by mouth daily. 11/18/18  Yes Janith Lima, MD  Cholecalciferol (VITAMIN D3) 125 MCG (5000 UT) TABS Take 1 tablet by mouth daily.   Yes [provider]  clobetasol ointment (TEMOVATE) 0.10 % Apply 1 application topically 2 (two) times daily. 11/25/18  Yes Janith Lima, MD  cyanocobalamin 500 MCG tablet Take 500 mcg by mouth daily.   Yes [provider]  levocetirizine (XYZAL) 5 MG tablet Take 1 tablet (5 mg total) by mouth every evening. Patient taking  differently: Take 5 mg by mouth daily as needed for allergies (eczema).  11/25/18  Yes Janith Lima, MD  Multiple Vitamin (MULTIVITAMIN) tablet Take 1 tablet by mouth daily.   Yes [provider]  VITAMIN E PO Take 1 tablet by mouth daily.    Yes [provider]    Family History Family History  Problem Relation Age of Onset  . Hypertension Sister   . Hypertension Brother   . Colon cancer Neg Hx   . Esophageal cancer Neg Hx   . Rectal cancer Neg Hx   . Stomach cancer Neg Hx     Social History Social History   Tobacco Use  . Smoking status: Never Smoker  . Smokeless tobacco: Never Used  Substance Use Topics  . Alcohol use: Yes    Alcohol/week: 0.0 standard drinks    Comment: RARE  . Drug use: Yes    Types: Marijuana     Allergies   Lipitor [atorvastatin]; Bystolic [nebivolol hcl]; Codeine sulfate; and Latex   Review of Systems Review of Systems  Constitutional: Negative for chills and fever.  HENT: Negative for ear pain and sore throat.   Eyes: Negative for pain and visual disturbance.  Respiratory: Positive for shortness of breath. Negative for cough.   Cardiovascular: Positive for chest pain. Negative for palpitations.  Gastrointestinal: Positive for nausea. Negative for abdominal pain and vomiting.  Genitourinary: Negative for dysuria and hematuria.  Musculoskeletal: Negative for arthralgias and back pain.  Skin: Negative for color change and rash.  Neurological: Positive for dizziness, light-headedness and headaches. Negative for seizures and syncope.  All other systems reviewed and are negative.    Physical Exam Updated Vital Signs BP (!) 177/80   Pulse 63   Temp 98.6 F (37 C) (Oral)   Resp 13   Ht 5\' 2"  (1.575 m)   Wt 78.7 kg   SpO2 100%   BMI 31.75 kg/m   Physical Exam Vitals signs and nursing note reviewed.  Constitutional:      General: She is not in acute distress.    Appearance: She is well-developed.  HENT:     Head:  Normocephalic and atraumatic.     Mouth/Throat:     Pharynx: No oropharyngeal exudate.  Eyes:     Pupils: Pupils are equal, round, and reactive to light.  Neck:     Musculoskeletal: Normal range of motion.  Cardiovascular:     Rate and Rhythm: Regular rhythm.     Heart sounds: Normal heart sounds.  Pulmonary:     Effort: Pulmonary  effort is normal. No respiratory distress.     Breath sounds: Normal breath sounds.  Abdominal:     General: Bowel sounds are normal. There is no distension.     Palpations: Abdomen is soft.     Tenderness: There is no abdominal tenderness.  Musculoskeletal:        General: No tenderness or deformity.     Right lower leg: No edema.     Left lower leg: No edema.  Skin:    General: Skin is warm and dry.  Neurological:     Mental Status: She is alert and oriented to person, place, and time.     Comments: Alert, oriented, thought content appropriate. Speech fluent without evidence of aphasia. Able to follow 2 step commands without difficulty.  Cranial Nerves:  II:  Peripheral visual fields grossly normal, pupils, round, reactive to light III,IV, VI: ptosis not present, extra-ocular motions intact bilaterally  V,VII: smile symmetric, facial light touch sensation equal VIII: hearing grossly normal bilaterally  IX,X: midline uvula rise  XI: bilateral shoulder shrug equal and strong XII: midline tongue extension  Motor:  5/5 in upper and lower extremities bilaterally including strong and equal grip strength and dorsiflexion/plantar flexion Sensory: light touch normal in all extremities.  Cerebellar: normal finger-to-nose with bilateral upper extremities, pronator drift negative        ED Treatments / Results  Labs (all labs ordered are listed, but only abnormal results are displayed) Labs Reviewed  BASIC METABOLIC PANEL - Abnormal; Notable for the following components:      Result Value   Potassium 3.4 (*)    Creatinine, Ser 1.26 (*)    GFR calc  non Af Amer 44 (*)    GFR calc Af Amer 51 (*)    All other components within normal limits  CBC WITH DIFFERENTIAL/PLATELET  BASIC METABOLIC PANEL  CBC  I-STAT TROPONIN, ED    EKG EKG Interpretation  Date/Time:  Thursday December 04 2018 18:36:10 EST Ventricular Rate:  55 PR Interval:    QRS Duration: 94 QT Interval:  428 QTC Calculation: 410 R Axis:   61 Text Interpretation:  Sinus rhythm Confirmed by Gerlene Fee [phone]) on 12/04/2018 8:17:05 PM   Radiology Dg Chest 2 View  Result Date: 12/04/2018 CLINICAL DATA:  Hypertension.  Shortness of breath. EXAM: CHEST - 2 VIEW COMPARISON:  07/10/2016 FINDINGS: The heart size and mediastinal contours are within normal limits. Both lungs are clear. The visualized skeletal structures are unremarkable. IMPRESSION: No active cardiopulmonary disease. Electronically Signed   By: Marin Olp M.D.   On: 12/04/2018 18:15   Ct Head Wo Contrast  Result Date: 12/04/2018 CLINICAL DATA:  Hypertension with headache and blurry vision EXAM: CT HEAD WITHOUT CONTRAST TECHNIQUE: Contiguous axial images were obtained from the base of the skull through the vertex without intravenous contrast. COMPARISON:  None. FINDINGS: Brain: No evidence of acute infarction, hemorrhage, hydrocephalus, extra-axial collection or mass lesion/mass effect. Right basal ganglial calcifications are noted that can be seen in the normal aging process. Vascular: No hyperdense vessel or unexpected calcification. Skull: Normal. Negative for fracture or focal lesion. Sinuses/Orbits: No acute finding. Other: None. IMPRESSION: Normal head CT. Electronically Signed   By: Ashley Royalty M.D.   On: 12/04/2018 20:53    Procedures Procedures (including critical care time)  Medications Ordered in ED Medications  vitamin B-12 (CYANOCOBALAMIN) tablet 500 mcg (has no administration in time range)  multivitamin tablet 1 tablet (has no administration in time range)  chlorthalidone (  HYGROTON) tablet  25 mg (has no administration in time range)  Vitamin D3 TABS 5,000 Units (has no administration in time range)  sodium chloride flush (NS) 0.9 % injection 3 mL (has no administration in time range)  sodium chloride flush (NS) 0.9 % injection 3 mL (has no administration in time range)  0.9 %  sodium chloride infusion (has no administration in time range)  acetaminophen (TYLENOL) tablet 650 mg (has no administration in time range)    Or  acetaminophen (TYLENOL) suppository 650 mg (has no administration in time range)  ibuprofen (ADVIL,MOTRIN) tablet 600 mg (has no administration in time range)  ondansetron (ZOFRAN) tablet 4 mg (has no administration in time range)    Or  ondansetron (ZOFRAN) injection 4 mg (has no administration in time range)  hydrALAZINE (APRESOLINE) tablet 25 mg (has no administration in time range)  sodium chloride 0.9 % bolus 500 mL (0 mLs Intravenous Stopped 12/04/18 2159)  hydrALAZINE (APRESOLINE) tablet 5 mg (5 mg Oral Given 12/04/18 2105)  hydrALAZINE (APRESOLINE) tablet 20 mg (20 mg Oral Given 12/04/18 2158)     Initial Impression / Assessment and Plan / ED Course  I have reviewed the triage vital signs and the nursing notes.  Pertinent labs & imaging results that were available during my care of the patient were reviewed by me and considered in my medical decision making (see chart for details).    Patient currently being treated by PCP for hypertension, has had multiple changes in medication within the past 2 weeks.  She reports today feeling a headache with some blurry vision, she says she checked her blood pressure and it was elevated with a systolic of upper 579 and a diastolic low 038B.  Neuro exam is unremarkable, patient is otherwise well-appearing.  Will order some blood work along with some screen imaging to rule out any signs of endorgan damage for a possible hypertensive urgency.  9:44 PM Spoke to Dr. Shanon Brow who recommended patient be treated more  aggressively for her HTN, will provide patient with hydralazine 20mg  and reassess.   10:50 PM Patient pressure has improved some, Dr. Shanon Brow has seen patient and will admit patient for further management of hypertension.    Final Clinical Impressions(s) / ED Diagnoses   Final diagnoses:  Hypertensive urgency    ED Discharge Orders    None       [name], PA-C 12/04/18 2250    Maudie Flakes, MD 12/05/18 [phone]

## 2018-12-04 NOTE — ED Notes (Signed)
ED TO INPATIENT HANDOFF REPORT  Name/Age/Gender Teresa Collins 68 y.o. female  Code Status    Code Status Orders  (From admission, onward)         Start     Ordered   12/04/18 2249  Full code  Continuous     12/04/18 2249        Code Status History    Date Active Date Inactive Code Status Order ID Comments User Context   07/21/2014 1809 07/23/2014 1830 Full Code [id number]  Milon Score, MD Inpatient      Home/SNF/Other Home  Chief Complaint High blood pressure, sent by dr  Level of Care/Admitting Diagnosis ED Disposition    ED Disposition Condition [place]: [facility] [[id number]]  Level of Care: Med-Surg [16]  Diagnosis: Hypertension [785885]  Admitting Physician: Phillips Grout [[id number]]  Attending Physician: Derrill Kay A [[id number]]  PT Class (Do Not Modify): Observation [104]  PT Acc Code (Do Not Modify): Observation [10022]       Medical History Past Medical History:  Diagnosis Date  . Arthritis   . ASCUS favor benign 12/2015   negative high risk HPV. Recommend repeat Pap smear in one year  . Eczema   . GERD (gastroesophageal reflux disease)   . Hx of colonic polyps   . Hyperlipidemia   . Hypertension   . Incisional hernia   . Numbness and tingling    RT THIGH  . Renal cancer ([place]) 2015   Left  . STD (sexually transmitted disease)    GC    Allergies Allergies  Allergen Reactions  . Lipitor [Atorvastatin] Other (See Comments)    Muscle aches  . Bystolic [Nebivolol Hcl] Swelling  . Codeine Sulfate Hives    REACTION: rash and itching  . Latex Rash    IV Location/Drains/Wounds Patient Lines/Drains/Airways Status   Active Line/Drains/Airways    Name:   Placement date:   Placement time:   Site:   Days:   Peripheral IV 12/04/18 Right Antecubital   12/04/18    2113    Antecubital   less than 1   Incision (Closed) 07/21/14 Abdomen   07/21/14    1239     1597   Incision - 5 Ports Abdomen 1:  Left;Lateral 2: Left;Distal 3: Left;Medial 4: Left;Proximal Left;Umbilicus   02/77/41    2878     1597          Labs/Imaging Results for orders placed or performed during the hospital encounter of 12/04/18 (from the past 48 hour(s))  CBC with Differential     Status: None   Collection Time: 12/04/18  6:26 PM  Result Value Ref Range   WBC 5.1 4.0 - 10.5 K/uL   RBC 4.63 3.87 - 5.11 MIL/uL   Hemoglobin 14.2 12.0 - 15.0 g/dL   HCT 43.7 36.0 - 46.0 %   MCV 94.4 80.0 - 100.0 fL   MCH 30.7 26.0 - 34.0 pg   MCHC 32.5 30.0 - 36.0 g/dL   RDW 14.3 11.5 - 15.5 %   Platelets 164 150 - 400 K/uL   nRBC 0.0 0.0 - 0.2 %   Neutrophils Relative % 51 %   Neutro Abs 2.6 1.7 - 7.7 K/uL   Lymphocytes Relative 34 %   Lymphs Abs 1.7 0.7 - 4.0 K/uL   Monocytes Relative 12 %   Monocytes Absolute 0.6 0.1 - 1.0 K/uL   Eosinophils Relative 2 %   Eosinophils Absolute  0.1 0.0 - 0.5 K/uL   Basophils Relative 1 %   Basophils Absolute 0.0 0.0 - 0.1 K/uL   Immature Granulocytes 0 %   Abs Immature Granulocytes 0.02 0.00 - 0.07 K/uL    Comment: Performed at [facility], [place] [address] [place], [place] [address]  Basic metabolic panel     Status: Abnormal   Collection Time: 12/04/18  6:26 PM  Result Value Ref Range   Sodium 138 135 - 145 mmol/L   Potassium 3.4 (L) 3.5 - 5.1 mmol/L   Chloride 100 98 - 111 mmol/L   CO2 27 22 - 32 mmol/L   Glucose, Bld 98 70 - 99 mg/dL   BUN 19 8 - 23 mg/dL   Creatinine, Ser 1.26 (H) 0.44 - 1.00 mg/dL   Calcium 10.3 8.9 - 10.3 mg/dL   GFR calc non Af Amer 44 (L) >60 mL/min   GFR calc Af Amer 51 (L) >60 mL/min   Anion gap 11 5 - 15    Comment: Performed at [facility] [address] [place], [place] [address]  I-Stat Troponin, ED (not at [facility])     Status: None   Collection Time: 12/04/18  6:30 PM  Result Value Ref Range   Troponin i, poc 0.00 0.00 - 0.08 ng/mL   Comment 3            Comment: Due to the release kinetics of cTnI, a  negative result within the first hours of the onset of symptoms does not rule out myocardial infarction with certainty. If myocardial infarction is still suspected, repeat the test at appropriate intervals.    Dg Chest 2 View  Result Date: 12/04/2018 CLINICAL DATA:  Hypertension.  Shortness of breath. EXAM: CHEST - 2 VIEW COMPARISON:  07/10/2016 FINDINGS: The heart size and mediastinal contours are within normal limits. Both lungs are clear. The visualized skeletal structures are unremarkable. IMPRESSION: No active cardiopulmonary disease. Electronically Signed   By: Marin Olp M.D.   On: 12/04/2018 18:15   Ct Head Wo Contrast  Result Date: 12/04/2018 CLINICAL DATA:  Hypertension with headache and blurry vision EXAM: CT HEAD WITHOUT CONTRAST TECHNIQUE: Contiguous axial images were obtained from the base of the skull through the vertex without intravenous contrast. COMPARISON:  None. FINDINGS: Brain: No evidence of acute infarction, hemorrhage, hydrocephalus, extra-axial collection or mass lesion/mass effect. Right basal ganglial calcifications are noted that can be seen in the normal aging process. Vascular: No hyperdense vessel or unexpected calcification. Skull: Normal. Negative for fracture or focal lesion. Sinuses/Orbits: No acute finding. Other: None. IMPRESSION: Normal head CT. Electronically Signed   By: Ashley Royalty M.D.   On: 12/04/2018 20:53   EKG Interpretation  Date/Time:  Thursday December 04 2018 18:36:10 EST Ventricular Rate:  55 PR Interval:    QRS Duration: 94 QT Interval:  428 QTC Calculation: 410 R Axis:   61 Text Interpretation:  Sinus rhythm Confirmed by Gerlene Fee [phone]) on 12/04/2018 8:17:05 PM   Pending Labs Unresulted Labs (From admission, onward)    Start     Ordered   12/05/18 2725  Basic metabolic panel  Tomorrow morning,   R     12/04/18 2249   12/05/18 0500  CBC  Tomorrow morning,   R     12/04/18 2249          Vitals/Pain Today's Vitals    12/04/18 2105 12/04/18 2130 12/04/18 2200 12/04/18 2230  BP: (!) 210/89 (!) 190/90 Marland Kitchen)  167/79 (!) 177/80  Pulse:  (!) 57 65 63  Resp:  12 12 13   Temp:      TempSrc:      SpO2:  100% 100% 100%  Weight:      Height:      PainSc:        Isolation Precautions No active isolations  Medications Medications  vitamin B-12 (CYANOCOBALAMIN) tablet 500 mcg (has no administration in time range)  multivitamin with minerals tablet 1 tablet (has no administration in time range)  chlorthalidone (HYGROTON) tablet 25 mg (has no administration in time range)  cholecalciferol (VITAMIN D3) tablet 5,000 Units (has no administration in time range)  sodium chloride flush (NS) 0.9 % injection 3 mL (has no administration in time range)  sodium chloride flush (NS) 0.9 % injection 3 mL (has no administration in time range)  0.9 %  sodium chloride infusion (has no administration in time range)  acetaminophen (TYLENOL) tablet 650 mg (has no administration in time range)    Or  acetaminophen (TYLENOL) suppository 650 mg (has no administration in time range)  ibuprofen (ADVIL,MOTRIN) tablet 600 mg (has no administration in time range)  ondansetron (ZOFRAN) tablet 4 mg (has no administration in time range)    Or  ondansetron (ZOFRAN) injection 4 mg (has no administration in time range)  hydrALAZINE (APRESOLINE) tablet 25 mg (25 mg Oral Given 12/04/18 2300)  sodium chloride 0.9 % bolus 500 mL (0 mLs Intravenous Stopped 12/04/18 2159)  hydrALAZINE (APRESOLINE) tablet 5 mg (5 mg Oral Given 12/04/18 2105)  hydrALAZINE (APRESOLINE) tablet 20 mg (20 mg Oral Given 12/04/18 2158)    Mobility walks

## 2018-12-04 NOTE — ED Triage Notes (Signed)
Pt states her BP has been elevated today, and was told to come to the ED by her PCP .  Home readings of :  176/101 173/99 165/88 191/96  Pt reports headache, blurred vision . Pt states that she has been taking BP meds.

## 2018-12-05 ENCOUNTER — Other Ambulatory Visit: Payer: Self-pay

## 2018-12-05 DIAGNOSIS — K219 Gastro-esophageal reflux disease without esophagitis: Secondary | ICD-10-CM

## 2018-12-05 DIAGNOSIS — I160 Hypertensive urgency: Secondary | ICD-10-CM | POA: Diagnosis not present

## 2018-12-05 DIAGNOSIS — L280 Lichen simplex chronicus: Secondary | ICD-10-CM

## 2018-12-05 DIAGNOSIS — I10 Essential (primary) hypertension: Secondary | ICD-10-CM | POA: Diagnosis not present

## 2018-12-05 LAB — CBC
HCT: 39.7 % (ref 36.0–46.0)
Hemoglobin: 12.8 g/dL (ref 12.0–15.0)
MCH: 30.5 pg (ref 26.0–34.0)
MCHC: 32.2 g/dL (ref 30.0–36.0)
MCV: 94.5 fL (ref 80.0–100.0)
Platelets: 152 10*3/uL (ref 150–400)
RBC: 4.2 MIL/uL (ref 3.87–5.11)
RDW: 14.3 % (ref 11.5–15.5)
WBC: 4.9 10*3/uL (ref 4.0–10.5)
nRBC: 0 % (ref 0.0–0.2)

## 2018-12-05 LAB — BASIC METABOLIC PANEL
Anion gap: 9 (ref 5–15)
BUN: 17 mg/dL (ref 8–23)
CHLORIDE: 104 mmol/L (ref 98–111)
CO2: 26 mmol/L (ref 22–32)
Calcium: 10 mg/dL (ref 8.9–10.3)
Creatinine, Ser: 1.18 mg/dL — ABNORMAL HIGH (ref 0.44–1.00)
GFR calc Af Amer: 56 mL/min — ABNORMAL LOW (ref >60)
GFR calc non Af Amer: 48 mL/min — ABNORMAL LOW (ref >60)
Glucose, Bld: 110 mg/dL — ABNORMAL HIGH (ref 70–99)
Potassium: 3.6 mmol/L (ref 3.5–5.1)
Sodium: 139 mmol/L (ref 135–145)

## 2018-12-05 MED ORDER — HYDRALAZINE HCL 25 MG PO TABS: 25.0000 mg | ORAL_TABLET | Freq: Three times a day (TID) | ORAL | 0 refills | Status: DC

## 2018-12-05 NOTE — Discharge Summary (Signed)
Physician Discharge Summary  Teresa Collins ZOX:[id number] DOB: 1950/10/26 DOA: 12/04/2018  PCP: Janith Lima, MD  Admit date: 12/04/2018 Discharge date: 12/05/2018  Admitted From: Home  Discharge disposition: Home   Recommendations for Outpatient Follow-Up:   Follow up with your primary care physician in 1 week for adjustment with blood pressure medication.  Discharge Diagnosis:   Principal Problem:   Hypertension Active Problems:   GERD   LSC (lichen simplex chronicus)  Discharge Condition: Improved.  Diet recommendation: Low sodium, heart healhy  Wound care: None.  Code status: Full.   History of Present Illness:   Teresa Collins is a 68 y.o. female with medical history significant of hypertension presented to the hospital with uncontrolled blood pressure at home with associated headache.  Patient been having headache for weeks.  She saw her PCP couple weeks ago started her on chlorthalidone 25 mg which she has been taking.  She is taking her blood pressure several times at home today persistently have been above 811 systolic.  She denied any focal neurological deficits.  She denied any chest pain.     Hospital Course:   Patient was placed in observation and following conditions were addressed during hospitalization,   hypertensive urgency.  Patient was started on oral hydralazine with improvement in her blood pressure.  Nodal blockers were avoided because of the relative bradycardia noted.  She will be continued on chlorthalidone from outpatient.  At this time, patient will need further adjustment in her blood pressure as outpatient by her primary care physician.Marland Kitchen  GERD-stable  LSC (lichen simplex chronicus)-stable   Medical Consultants:    None.   Discharge Exam:   Vitals:   12/05/18 0021 12/05/18 0609  BP: (!) 160/86 (!) 146/84  Pulse: 61 (!) 57  Resp: 14 20  Temp: 98.7 F (37.1 C) 98.2 F (36.8 C)  SpO2: 100% 100%   Vitals:   12/04/18  2200 12/04/18 2230 12/05/18 0021 12/05/18 0609  BP: (!) 167/79 (!) 177/80 (!) 160/86 (!) 146/84  Pulse: 65 63 61 (!) 57  Resp: 12 13 14 20   Temp:   98.7 F (37.1 C) 98.2 F (36.8 C)  TempSrc:   Oral Oral  SpO2: 100% 100% 100% 100%  Weight:      Height:        General exam: Appears calm and comfortable.  Respiratory system: Clear to auscultation. Respiratory effort normal. Cardiovascular system: S1 & S2 heard, RRR. No JVD,  rubs, gallops or clicks. No murmurs. Gastrointestinal system: Abdomen is nondistended, soft and nontender. No organomegaly or masses felt. Normal bowel sounds heard. Central nervous system: Alert and oriented. No focal neurological deficits. Extremities: No clubbing,  or cyanosis. No edema. Skin: No rashes, lesions or ulcers. Psychiatry: Judgement and insight appear normal. Mood & affect appropriate.    The results of significant diagnostics from this hospitalization (including imaging, microbiology, ancillary and laboratory) are listed below for reference.     Procedures and Diagnostic Studies:   Dg Chest 2 View  Result Date: 12/04/2018 CLINICAL DATA:  Hypertension.  Shortness of breath. EXAM: CHEST - 2 VIEW COMPARISON:  07/10/2016 FINDINGS: The heart size and mediastinal contours are within normal limits. Both lungs are clear. The visualized skeletal structures are unremarkable. IMPRESSION: No active cardiopulmonary disease. Electronically Signed   By: Marin Olp M.D.   On: 12/04/2018 18:15   Ct Head Wo Contrast  Result Date: 12/04/2018 CLINICAL DATA:  Hypertension with headache and blurry vision EXAM: CT HEAD WITHOUT CONTRAST  TECHNIQUE: Contiguous axial images were obtained from the base of the skull through the vertex without intravenous contrast. COMPARISON:  None. FINDINGS: Brain: No evidence of acute infarction, hemorrhage, hydrocephalus, extra-axial collection or mass lesion/mass effect. Right basal ganglial calcifications are noted that can be seen in  the normal aging process. Vascular: No hyperdense vessel or unexpected calcification. Skull: Normal. Negative for fracture or focal lesion. Sinuses/Orbits: No acute finding. Other: None. IMPRESSION: Normal head CT. Electronically Signed   By: Ashley Royalty M.D.   On: 12/04/2018 20:53     Labs:   Basic Metabolic Panel: Recent Labs  Lab 12/04/18 1826 12/05/18 0320  NA 138 139  K 3.4* 3.6  CL 100 104  CO2 27 26  GLUCOSE 98 110*  BUN 19 17  CREATININE 1.26* 1.18*  CALCIUM 10.3 10.0   GFR Estimated Creatinine Clearance: 45.5 mL/min (A) (by C-G formula based on SCr of 1.18 mg/dL (H)). Liver Function Tests: No results for input(s): AST, ALT, ALKPHOS, BILITOT, PROT, ALBUMIN in the last 168 hours. No results for input(s): LIPASE, AMYLASE in the last 168 hours. No results for input(s): AMMONIA in the last 168 hours. Coagulation profile No results for input(s): INR, PROTIME in the last 168 hours.  CBC: Recent Labs  Lab 12/04/18 1826 12/05/18 0320  WBC 5.1 4.9  NEUTROABS 2.6  --   HGB 14.2 12.8  HCT 43.7 39.7  MCV 94.4 94.5  PLT 164 152   Cardiac Enzymes: No results for input(s): CKTOTAL, CKMB, CKMBINDEX, TROPONINI in the last 168 hours. BNP: Invalid input(s): POCBNP CBG: No results for input(s): GLUCAP in the last 168 hours. D-Dimer No results for input(s): DDIMER in the last 72 hours. Hgb A1c No results for input(s): HGBA1C in the last 72 hours. Lipid Profile No results for input(s): CHOL, HDL, LDLCALC, TRIG, CHOLHDL, LDLDIRECT in the last 72 hours. Thyroid function studies No results for input(s): TSH, T4TOTAL, T3FREE, THYROIDAB in the last 72 hours.  Invalid input(s): FREET3 Anemia work up No results for input(s): VITAMINB12, FOLATE, FERRITIN, TIBC, IRON, RETICCTPCT in the last 72 hours. Microbiology No results found for this or any previous visit (from the past 240 hour(s)).   Discharge Instructions:   Discharge Instructions    Call MD for:  persistant  dizziness or light-headedness   Complete by:  As directed    Diet - low sodium heart healthy   Complete by:  As directed    Discharge instructions   Complete by:  As directed    You have been prescribed new medication for high blood pressure. Please follow up with your primary care physician within one week to adjust your medications. Check blood pressure at home daily. Low salt diet. Seek medical attention for worsening symptoms   Increase activity slowly   Complete by:  As directed      Allergies as of 12/05/2018      Reactions   Lipitor [atorvastatin] Other (See Comments)   Muscle aches   Bystolic [nebivolol Hcl] Swelling   Codeine Sulfate Hives   REACTION: rash and itching   Latex Rash      Medication List    TAKE these medications   chlorthalidone 25 MG tablet Commonly known as:  HYGROTON Take 1 tablet (25 mg total) by mouth daily.   clobetasol ointment 0.05 % Commonly known as:  TEMOVATE Apply 1 application topically 2 (two) times daily.   hydrALAZINE 25 MG tablet Commonly known as:  APRESOLINE Take 1 tablet (25 mg total) by  mouth 3 (three) times daily.   levocetirizine 5 MG tablet Commonly known as:  XYZAL Take 1 tablet (5 mg total) by mouth every evening. What changed:    when to take this  reasons to take this   multivitamin tablet Take 1 tablet by mouth daily.   vitamin B-12 500 MCG tablet Commonly known as:  CYANOCOBALAMIN Take 500 mcg by mouth daily.   Vitamin D3 125 MCG (5000 UT) Tabs Take 1 tablet by mouth daily.   VITAMIN E PO Take 1 tablet by mouth daily.        Time coordinating discharge: 39 minutes  Signed:  Charlita Brian  [facility] 12/05/2018, 8:38 AM

## 2018-12-05 NOTE — Progress Notes (Signed)
Pt admitted from ED to 1619. Reviewed plan of care and answered questions. BP 160/86 on initial VS reading here. Provided pt with snack and discussed low sodium diet. Continue to monitor. Hortencia Conradi RN

## 2018-12-08 ENCOUNTER — Telehealth: Payer: Self-pay | Admitting: *Deleted

## 2018-12-08 NOTE — Telephone Encounter (Signed)
Rec'd msg off Patient Teresa Collins pt had been discharge from hospital on 12/05/18. Per pt chart appt has been scheduled for 12/09/18. Called pt to verify appt and complete TCM call.../lmb  Transition Care Management Follow-up Telephone Call   Date discharged? 12/05/18   How have you been since you were released from the hospital? Pt states she is doing well   Do you understand why you were in the hospital? YES   Do you understand the discharge instructions? YES   Where were you discharged to? Home   Items Reviewed:  Medications reviewed: YES  Allergies reviewed: YES  Dietary changes reviewed: YES  Referrals reviewed: No referral needed   Functional Questionnaire:   Activities of Daily Living (ADLs):   She states she are independent in the following: ambulation, bathing and hygiene, feeding, continence, grooming, toileting and dressing States she doesn't require assistance    Any transportation issues/concerns?: NO   Any patient concerns? NO   Confirmed importance and date/time of follow-up visits scheduled YES, apt 12/09/18  Provider Appointment booked with Dr. Ronnald Ramp  Confirmed with patient if condition begins to worsen call PCP or go to the ER.  Patient was given the office number and encouraged to call back with question or concerns.  : YES

## 2018-12-09 ENCOUNTER — Other Ambulatory Visit: Payer: Self-pay | Admitting: Internal Medicine

## 2018-12-09 ENCOUNTER — Encounter: Payer: Self-pay | Admitting: Internal Medicine

## 2018-12-09 ENCOUNTER — Ambulatory Visit (INDEPENDENT_AMBULATORY_CARE_PROVIDER_SITE_OTHER): Payer: Medicare Other | Admitting: Internal Medicine

## 2018-12-09 VITALS — BP 164/80 | HR 69 | Temp 98.0°F | Resp 16 | Ht 62.0 in | Wt 172.2 lb

## 2018-12-09 DIAGNOSIS — I10 Essential (primary) hypertension: Secondary | ICD-10-CM | POA: Diagnosis not present

## 2018-12-09 DIAGNOSIS — Z1231 Encounter for screening mammogram for malignant neoplasm of breast: Secondary | ICD-10-CM

## 2018-12-09 MED ORDER — AMLODIPINE BESYLATE 10 MG PO TABS: 10.0000 mg | ORAL_TABLET | Freq: Every day | ORAL | 1 refills | Status: DC

## 2018-12-09 NOTE — Progress Notes (Signed)
Subjective:  Patient ID: Teresa Collins, female    DOB: Feb 03, 1951  Age: 68 y.o. MRN: [id number]  CC: Hypertension   HPI Teresa Collins presents for a BP check - She was recently admitted overnight for poorly controlled hypertension and headache.  She has been compliant with chlorthalidone.  Hydralazine was added which has helped some but she tells me her blood pressure is still not in the normal range.  The headache has resolved.  She exercises and denies any recent episodes of CP, DOE, SOB, or edema.  Outpatient Medications Prior to Visit  Medication Sig Dispense Refill  . chlorthalidone (HYGROTON) 25 MG tablet Take 1 tablet (25 mg total) by mouth daily. 90 tablet 0  . Cholecalciferol (VITAMIN D3) 125 MCG (5000 UT) TABS Take 1 tablet by mouth daily.    . clobetasol ointment (TEMOVATE) 7.09 % Apply 1 application topically 2 (two) times daily. 60 g 1  . cyanocobalamin 500 MCG tablet Take 500 mcg by mouth daily.    . hydrALAZINE (APRESOLINE) 25 MG tablet Take 1 tablet (25 mg total) by mouth 3 (three) times daily. 90 tablet 0  . Multiple Vitamin (MULTIVITAMIN) tablet Take 1 tablet by mouth daily.    Marland Kitchen VITAMIN E PO Take 1 tablet by mouth daily.     Marland Kitchen levocetirizine (XYZAL) 5 MG tablet Take 1 tablet (5 mg total) by mouth every evening. (Patient taking differently: Take 5 mg by mouth daily as needed for allergies (eczema). ) 90 tablet 1   No facility-administered medications prior to visit.     ROS Review of Systems  Constitutional: Negative.  Negative for diaphoresis and fatigue.  HENT: Negative.   Eyes: Negative for visual disturbance.  Respiratory: Negative for cough, chest tightness, shortness of breath and wheezing.   Cardiovascular: Negative for chest pain, palpitations and leg swelling.  Gastrointestinal: Negative for abdominal pain, diarrhea, nausea and vomiting.  Genitourinary: Negative.  Negative for difficulty urinating.  Musculoskeletal: Negative.  Negative for  arthralgias, back pain, myalgias and neck pain.  Skin: Negative for color change and pallor.  Neurological: Negative for dizziness, weakness and headaches.  Hematological: Negative for adenopathy. Does not bruise/bleed easily.  Psychiatric/Behavioral: Negative.     Objective:  BP (!) 164/80 (BP Location: Left Arm, Patient Position: Sitting, Cuff Size: Normal)   Pulse 69   Temp 98 F (36.7 C) (Oral)   Resp 16   Ht 5\' 2"  (1.575 m)   Wt 172 lb 4 oz (78.1 kg)   SpO2 98%   BMI 31.50 kg/m   BP Readings from Last 3 Encounters:  12/09/18 (!) 164/80  12/05/18 (!) 146/84  11/25/18 (!) 160/90    Wt Readings from Last 3 Encounters:  12/09/18 172 lb 4 oz (78.1 kg)  12/04/18 173 lb 9.6 oz (78.7 kg)  11/25/18 170 lb (77.1 kg)    Physical Exam Vitals signs reviewed.  Constitutional:      Appearance: She is obese. She is not ill-appearing or diaphoretic.  HENT:     Nose: Nose normal. No congestion.     Mouth/Throat:     Mouth: Mucous membranes are moist.     Pharynx: Oropharynx is clear. No oropharyngeal exudate or posterior oropharyngeal erythema.  Eyes:     General: No scleral icterus.    Conjunctiva/sclera: Conjunctivae normal.  Neck:     Musculoskeletal: Normal range of motion and neck supple. No muscular tenderness.  Cardiovascular:     Rate and Rhythm: Normal rate and regular rhythm.  Heart sounds: No murmur. No gallop.   Pulmonary:     Effort: Pulmonary effort is normal. No respiratory distress.     Breath sounds: Normal breath sounds. No stridor. No wheezing, rhonchi or rales.  Abdominal:     General: Abdomen is flat.     Palpations: There is no hepatomegaly or splenomegaly.     Tenderness: There is no abdominal tenderness.  Musculoskeletal: Normal range of motion.        General: No swelling.     Right lower leg: No edema.     Left lower leg: No edema.  Lymphadenopathy:     Cervical: No cervical adenopathy.  Skin:    General: Skin is warm and dry.    Neurological:     General: No focal deficit present.     Mental Status: She is oriented to person, place, and time. Mental status is at baseline.     Lab Results  Component Value Date   WBC 4.9 12/05/2018   HGB 12.8 12/05/2018   HCT 39.7 12/05/2018   PLT 152 12/05/2018   GLUCOSE 110 (H) 12/05/2018   CHOL 218 (H) 04/17/2018   TRIG 142.0 04/17/2018   HDL 51.50 04/17/2018   LDLDIRECT 166.8 03/31/2013   LDLCALC 138 (H) 04/17/2018   ALT 13 04/17/2018   AST 16 04/17/2018   NA 139 12/05/2018   K 3.6 12/05/2018   CL 104 12/05/2018   CREATININE 1.18 (H) 12/05/2018   BUN 17 12/05/2018   CO2 26 12/05/2018   TSH 0.52 04/17/2018   HGBA1C 5.8 12/19/2016    Dg Chest 2 View  Result Date: 12/04/2018 CLINICAL DATA:  Hypertension.  Shortness of breath. EXAM: CHEST - 2 VIEW COMPARISON:  07/10/2016 FINDINGS: The heart size and mediastinal contours are within normal limits. Both lungs are clear. The visualized skeletal structures are unremarkable. IMPRESSION: No active cardiopulmonary disease. Electronically Signed   By: Marin Olp M.D.   On: 12/04/2018 18:15   Ct Head Wo Contrast  Result Date: 12/04/2018 CLINICAL DATA:  Hypertension with headache and blurry vision EXAM: CT HEAD WITHOUT CONTRAST TECHNIQUE: Contiguous axial images were obtained from the base of the skull through the vertex without intravenous contrast. COMPARISON:  None. FINDINGS: Brain: No evidence of acute infarction, hemorrhage, hydrocephalus, extra-axial collection or mass lesion/mass effect. Right basal ganglial calcifications are noted that can be seen in the normal aging process. Vascular: No hyperdense vessel or unexpected calcification. Skull: Normal. Negative for fracture or focal lesion. Sinuses/Orbits: No acute finding. Other: None. IMPRESSION: Normal head CT. Electronically Signed   By: Ashley Royalty M.D.   On: 12/04/2018 20:53    Assessment & Plan:   Teresa Collins was seen today for hypertension.  Diagnoses and all  orders for this visit:  Essential hypertension- Her blood pressure not adequately well controlled on the current regimen of hydralazine and chlorthalidone.  I have asked her to add amlodipine.  She was praised for her lifestyle modifications. -     amLODipine (NORVASC) 10 MG tablet; Take 1 tablet (10 mg total) by mouth daily.   I have discontinued River Desrosier's levocetirizine. I am also having her start on amLODipine. Additionally, I am having her maintain her vitamin B-12, VITAMIN E PO, multivitamin, chlorthalidone, clobetasol ointment, Vitamin D3, and hydrALAZINE.  Meds ordered this encounter  Medications  . amLODipine (NORVASC) 10 MG tablet    Sig: Take 1 tablet (10 mg total) by mouth daily.    Dispense:  90 tablet    Refill:  1     Follow-up: Return in about 3 months (around 03/09/2019).  [name], MD

## 2018-12-09 NOTE — Patient Instructions (Signed)

## 2018-12-21 DIAGNOSIS — R87610 Atypical squamous cells of undetermined significance on cytologic smear of cervix (ASC-US): Secondary | ICD-10-CM

## 2018-12-21 HISTORY — DX: Atypical squamous cells of undetermined significance on cytologic smear of cervix (ASC-US): R87.610

## 2018-12-29 ENCOUNTER — Telehealth: Payer: Self-pay | Admitting: *Deleted

## 2018-12-29 NOTE — Telephone Encounter (Signed)
Called and LVM to inform patient that nurse will need to cancel her scheduled AWV on 12/30/18. It was explained that it has not been a year since her previous AWV.

## 2018-12-30 ENCOUNTER — Ambulatory Visit (INDEPENDENT_AMBULATORY_CARE_PROVIDER_SITE_OTHER): Payer: Medicare Other | Admitting: Internal Medicine

## 2018-12-30 ENCOUNTER — Encounter: Payer: Self-pay | Admitting: Internal Medicine

## 2018-12-30 ENCOUNTER — Ambulatory Visit: Payer: Medicare Other

## 2018-12-30 VITALS — BP 164/96 | HR 60 | Temp 98.0°F | Resp 16 | Ht 62.0 in | Wt 169.5 lb

## 2018-12-30 DIAGNOSIS — C642 Malignant neoplasm of left kidney, except renal pelvis: Secondary | ICD-10-CM | POA: Diagnosis not present

## 2018-12-30 DIAGNOSIS — I10 Essential (primary) hypertension: Secondary | ICD-10-CM | POA: Diagnosis not present

## 2018-12-30 MED ORDER — HYDRALAZINE HCL 50 MG PO TABS: 50.0000 mg | ORAL_TABLET | Freq: Three times a day (TID) | ORAL | 1 refills | Status: DC

## 2018-12-30 NOTE — Patient Instructions (Signed)

## 2018-12-30 NOTE — Progress Notes (Signed)
Subjective:  Patient ID: Teresa Collins, female    DOB: Nov 09, 1950  Age: 68 y.o. MRN: [id number]  CC: Hypertension   HPI Teresa Collins presents for a BP check - She is concerned that her blood pressure is not adequately well controlled.  She tells me she is compliant with chlorthalidone, amlodipine, and hydralazine.  Outpatient Medications Prior to Visit  Medication Sig Dispense Refill  . chlorthalidone (HYGROTON) 25 MG tablet Take 1 tablet (25 mg total) by mouth daily. 90 tablet 0  . Cholecalciferol (VITAMIN D3) 125 MCG (5000 UT) TABS Take 1 tablet by mouth daily.    . clobetasol ointment (TEMOVATE) 7.34 % Apply 1 application topically 2 (two) times daily. 60 g 1  . cyanocobalamin 500 MCG tablet Take 500 mcg by mouth daily.    . Multiple Vitamin (MULTIVITAMIN) tablet Take 1 tablet by mouth daily.    Marland Kitchen amLODipine (NORVASC) 10 MG tablet Take 1 tablet (10 mg total) by mouth daily. 90 tablet 1  . hydrALAZINE (APRESOLINE) 25 MG tablet Take 1 tablet (25 mg total) by mouth 3 (three) times daily. 90 tablet 0  . VITAMIN E PO Take 1 tablet by mouth daily.      No facility-administered medications prior to visit.     ROS Review of Systems  Constitutional: Negative for diaphoresis and fatigue.  HENT: Negative.   Eyes: Negative for visual disturbance.  Respiratory: Negative for cough, chest tightness, shortness of breath and wheezing.   Cardiovascular: Negative for chest pain, palpitations and leg swelling.  Gastrointestinal: Negative for abdominal pain, diarrhea, nausea and vomiting.  Endocrine: Negative.   Genitourinary: Negative.  Negative for difficulty urinating and hematuria.  Musculoskeletal: Negative for back pain.  Skin: Negative for color change and pallor.  Neurological: Negative for dizziness, weakness and light-headedness.  Hematological: Negative for adenopathy. Does not bruise/bleed easily.  Psychiatric/Behavioral: Negative.     Objective:  BP (!) 164/96 (BP  Location: Left Arm, Patient Position: Sitting, Cuff Size: Normal)   Pulse 60   Temp 98 F (36.7 C) (Oral)   Resp 16   Ht 5\' 2"  (1.575 m)   Wt 169 lb 8 oz (76.9 kg)   SpO2 99%   BMI 31.00 kg/m   BP Readings from Last 3 Encounters:  12/30/18 (!) 164/96  12/09/18 (!) 164/80  12/05/18 (!) 146/84    Wt Readings from Last 3 Encounters:  12/30/18 169 lb 8 oz (76.9 kg)  12/09/18 172 lb 4 oz (78.1 kg)  12/04/18 173 lb 9.6 oz (78.7 kg)    Physical Exam Vitals signs reviewed.  Constitutional:      Appearance: She is obese. She is not ill-appearing or diaphoretic.  HENT:     Nose: Nose normal. No congestion or rhinorrhea.     Mouth/Throat:     Mouth: Mucous membranes are moist.     Pharynx: Oropharynx is clear. No oropharyngeal exudate.  Eyes:     General: No scleral icterus. Neck:     Musculoskeletal: Normal range of motion and neck supple. No muscular tenderness.  Cardiovascular:     Rate and Rhythm: Normal rate and regular rhythm.     Heart sounds: No murmur. No gallop.   Pulmonary:     Effort: Pulmonary effort is normal.     Breath sounds: No stridor. No wheezing, rhonchi or rales.  Abdominal:     General: Bowel sounds are normal.     Tenderness: There is no abdominal tenderness. There is no guarding.  Musculoskeletal: Normal  range of motion.        General: No swelling.     Right lower leg: No edema.     Left lower leg: No edema.  Lymphadenopathy:     Cervical: No cervical adenopathy.  Skin:    General: Skin is warm and dry.     Coloration: Skin is not pale.     Findings: No erythema or rash.  Neurological:     General: No focal deficit present.     Mental Status: She is oriented to person, place, and time. Mental status is at baseline.     Lab Results  Component Value Date   WBC 4.9 12/05/2018   HGB 12.8 12/05/2018   HCT 39.7 12/05/2018   PLT 152 12/05/2018   GLUCOSE 110 (H) 12/05/2018   CHOL 218 (H) 04/17/2018   TRIG 142.0 04/17/2018   HDL 51.50  04/17/2018   LDLDIRECT 166.8 03/31/2013   LDLCALC 138 (H) 04/17/2018   ALT 13 04/17/2018   AST 16 04/17/2018   NA 139 12/05/2018   K 3.6 12/05/2018   CL 104 12/05/2018   CREATININE 1.18 (H) 12/05/2018   BUN 17 12/05/2018   CO2 26 12/05/2018   TSH 0.52 04/17/2018   HGBA1C 5.8 12/19/2016    Dg Chest 2 View  Result Date: 12/04/2018 CLINICAL DATA:  Hypertension.  Shortness of breath. EXAM: CHEST - 2 VIEW COMPARISON:  07/10/2016 FINDINGS: The heart size and mediastinal contours are within normal limits. Both lungs are clear. The visualized skeletal structures are unremarkable. IMPRESSION: No active cardiopulmonary disease. Electronically Signed   By: Marin Olp M.D.   On: 12/04/2018 18:15   Ct Head Wo Contrast  Result Date: 12/04/2018 CLINICAL DATA:  Hypertension with headache and blurry vision EXAM: CT HEAD WITHOUT CONTRAST TECHNIQUE: Contiguous axial images were obtained from the base of the skull through the vertex without intravenous contrast. COMPARISON:  None. FINDINGS: Brain: No evidence of acute infarction, hemorrhage, hydrocephalus, extra-axial collection or mass lesion/mass effect. Right basal ganglial calcifications are noted that can be seen in the normal aging process. Vascular: No hyperdense vessel or unexpected calcification. Skull: Normal. Negative for fracture or focal lesion. Sinuses/Orbits: No acute finding. Other: None. IMPRESSION: Normal head CT. Electronically Signed   By: Ashley Royalty M.D.   On: 12/04/2018 20:53    Assessment & Plan:   Teresa Collins was seen today for hypertension.  Diagnoses and all orders for this visit:  Essential hypertension- Her blood pressure is not adequately well controlled.  I have asked her to double the dose of hydralazine and to continue taking chlorthalidone and amlodipine. -     hydrALAZINE (APRESOLINE) 50 MG tablet; Take 1 tablet (50 mg total) by mouth 3 (three) times daily.   I have discontinued [employer] VITAMIN E PO,  hydrALAZINE, and amLODipine. I am also having her start on hydrALAZINE. Additionally, I am having her maintain her vitamin B-12, multivitamin, chlorthalidone, clobetasol ointment, and Vitamin D3.  Meds ordered this encounter  Medications  . hydrALAZINE (APRESOLINE) 50 MG tablet    Sig: Take 1 tablet (50 mg total) by mouth 3 (three) times daily.    Dispense:  270 tablet    Refill:  1     Follow-up: Return in about 2 months (around 03/01/2019).  [name], MD

## 2019-01-01 ENCOUNTER — Other Ambulatory Visit: Payer: Self-pay | Admitting: Internal Medicine

## 2019-01-01 DIAGNOSIS — I10 Essential (primary) hypertension: Secondary | ICD-10-CM

## 2019-01-05 ENCOUNTER — Other Ambulatory Visit: Payer: Self-pay

## 2019-01-05 ENCOUNTER — Ambulatory Visit (HOSPITAL_COMMUNITY)
Admission: RE | Admit: 2019-01-05 | Discharge: 2019-01-05 | Disposition: A | Discharge status: Home / Self Care | Payer: Medicare Other | Source: Ambulatory Visit | Attending: Urology | Admitting: Urology

## 2019-01-05 ENCOUNTER — Other Ambulatory Visit (HOSPITAL_COMMUNITY): Payer: Self-pay | Admitting: Urology

## 2019-01-05 DIAGNOSIS — C642 Malignant neoplasm of left kidney, except renal pelvis: Secondary | ICD-10-CM

## 2019-01-05 DIAGNOSIS — R05 Cough: Secondary | ICD-10-CM | POA: Diagnosis not present

## 2019-01-05 DIAGNOSIS — N289 Disorder of kidney and ureter, unspecified: Secondary | ICD-10-CM | POA: Diagnosis not present

## 2019-01-08 ENCOUNTER — Other Ambulatory Visit: Payer: Self-pay

## 2019-01-09 DIAGNOSIS — C642 Malignant neoplasm of left kidney, except renal pelvis: Secondary | ICD-10-CM | POA: Diagnosis not present

## 2019-01-12 ENCOUNTER — Encounter: Payer: Medicare Other | Admitting: Gynecology

## 2019-01-12 ENCOUNTER — Ambulatory Visit (INDEPENDENT_AMBULATORY_CARE_PROVIDER_SITE_OTHER): Payer: Medicare Other | Admitting: Gynecology

## 2019-01-12 ENCOUNTER — Encounter: Payer: Self-pay | Admitting: Gynecology

## 2019-01-12 ENCOUNTER — Other Ambulatory Visit: Payer: Self-pay

## 2019-01-12 VITALS — BP 124/80 | Ht 62.0 in | Wt 170.0 lb

## 2019-01-12 DIAGNOSIS — N952 Postmenopausal atrophic vaginitis: Secondary | ICD-10-CM

## 2019-01-12 DIAGNOSIS — Z01419 Encounter for gynecological examination (general) (routine) without abnormal findings: Secondary | ICD-10-CM

## 2019-01-12 DIAGNOSIS — R87610 Atypical squamous cells of undetermined significance on cytologic smear of cervix (ASC-US): Secondary | ICD-10-CM | POA: Diagnosis not present

## 2019-01-12 LAB — HM PAP SMEAR

## 2019-01-12 NOTE — Progress Notes (Signed)
    Teresa Collins 01-Feb-1951 [id number]        68 y.o.  [id number] for annual gynecologic exam.  Without gynecologic complaints  Past medical history,surgical history, problem list, medications, allergies, family history and social history were all reviewed and documented as reviewed in the EPIC chart.  ROS:  Performed with pertinent positives and negatives included in the history, assessment and plan.   Additional significant findings : None   Exam: Caryn Bee assistant Vitals:   01/12/19 1051  BP: 124/80  Weight: 170 lb (77.1 kg)  Height: 5\' 2"  (1.575 m)   Body mass index is 31.09 kg/m.  General appearance:  Normal affect, orientation and appearance. Skin: Grossly normal HEENT: Without gross lesions.  No cervical or supraclavicular adenopathy. Thyroid normal.  Lungs:  Clear without wheezing, rales or rhonchi Cardiac: RR, without RMG Abdominal:  Soft, nontender, without masses, guarding, rebound, organomegaly or hernia Breasts:  Examined lying and sitting without masses, retractions, discharge or axillary adenopathy. Pelvic:  Ext, BUS, Vagina: Normal with atrophic changes  Cervix: Normal with atrophic changes.  Pap smear done  Uterus: Anteverted, normal size, shape and contour, midline and mobile nontender   Adnexa: Without masses or tenderness    Anus and perineum: Normal   Rectovaginal: Normal sphincter tone without palpated masses or tenderness.    Assessment/Plan:  68 y.o. [id number] female for annual gynecologic exam.   1. Postmenopausal.  No significant menopausal symptoms or any vaginal bleeding. 2. History of leiomyoma.  Uterus palpates normal.  Continue to follow with annual exams. 3. Pap smear 2017.  Pap smear done today.  No history of abnormal Pap smears.  Options to stop screening per current screening guidelines based on age reviewed.  Will readdress on an annual basis. 4. Mammography scheduled and she will follow-up for this.  Breast exam normal today.  SBE  monthly reviewed. 5. DEXA 2017 normal.  Plan repeat DEXA at 5-year interval. 6. Colonoscopy 2018.  Repeat at their recommended interval. 7. Health maintenance.  No routine lab work done as patient does this elsewhere.  Follow-up 1 year, sooner as needed.   Anastasio Auerbach MD, 11:18 AM 01/12/2019

## 2019-01-12 NOTE — Addendum Note (Signed)
Addended by: Nelva Nay on: 01/12/2019 11:46 AM   Modules accepted: Orders

## 2019-01-12 NOTE — Patient Instructions (Signed)
Follow-up in 1 year for annual exam, sooner as needed. 

## 2019-01-13 ENCOUNTER — Ambulatory Visit: Payer: Medicare Other

## 2019-01-14 ENCOUNTER — Encounter: Payer: Self-pay | Admitting: Gynecology

## 2019-01-14 LAB — PAP IG W/ RFLX HPV ASCU

## 2019-01-14 LAB — HUMAN PAPILLOMAVIRUS, HIGH RISK: HPV DNA High Risk: NOT DETECTED

## 2019-02-10 ENCOUNTER — Ambulatory Visit: Payer: Medicare Other

## 2019-02-12 ENCOUNTER — Other Ambulatory Visit: Payer: Self-pay

## 2019-02-12 ENCOUNTER — Other Ambulatory Visit (INDEPENDENT_AMBULATORY_CARE_PROVIDER_SITE_OTHER): Payer: Medicare Other

## 2019-02-12 ENCOUNTER — Ambulatory Visit (INDEPENDENT_AMBULATORY_CARE_PROVIDER_SITE_OTHER): Payer: Medicare Other | Admitting: Internal Medicine

## 2019-02-12 ENCOUNTER — Encounter: Payer: Self-pay | Admitting: Internal Medicine

## 2019-02-12 VITALS — BP 138/82 | HR 80 | Temp 98.4°F | Resp 16 | Ht 62.0 in | Wt 173.8 lb

## 2019-02-12 DIAGNOSIS — N183 Chronic kidney disease, stage 3 unspecified: Secondary | ICD-10-CM

## 2019-02-12 DIAGNOSIS — T502X5A Adverse effect of carbonic-anhydrase inhibitors, benzothiadiazides and other diuretics, initial encounter: Secondary | ICD-10-CM

## 2019-02-12 DIAGNOSIS — I10 Essential (primary) hypertension: Secondary | ICD-10-CM

## 2019-02-12 DIAGNOSIS — E876 Hypokalemia: Secondary | ICD-10-CM | POA: Diagnosis not present

## 2019-02-12 LAB — BASIC METABOLIC PANEL
BUN: 21 mg/dL (ref 6–23)
CO2: 31 mEq/L (ref 19–32)
Calcium: 10.5 mg/dL (ref 8.4–10.5)
Chloride: 100 mEq/L (ref 96–112)
Creatinine, Ser: 1.27 mg/dL — ABNORMAL HIGH (ref 0.40–1.20)
GFR: 50.85 mL/min — ABNORMAL LOW (ref >60.00)
Glucose, Bld: 87 mg/dL (ref 70–99)
Potassium: 3.4 mEq/L — ABNORMAL LOW (ref 3.5–5.1)
Sodium: 140 mEq/L (ref 135–145)

## 2019-02-12 MED ORDER — POTASSIUM CHLORIDE CRYS ER 20 MEQ PO TBCR: 20.0000 meq | EXTENDED_RELEASE_TABLET | Freq: Two times a day (BID) | ORAL | 0 refills | Status: DC

## 2019-02-12 NOTE — Progress Notes (Signed)
Subjective:  Patient ID: Teresa Collins, female    DOB: 1951/04/13  Age: 68 y.o. MRN: [id number]  CC: Hypertension   HPI Beonka Amesquita presents for f/up - She tells me her blood pressure has been well controlled.  She is compliant with her current antihypertensives.  She denies headache, blurred vision, chest pain, shortness of breath, dizziness, or lightheadedness.  Outpatient Medications Prior to Visit  Medication Sig Dispense Refill  . chlorthalidone (HYGROTON) 25 MG tablet Take 1 tablet by mouth once daily 90 tablet 0  . Cholecalciferol (VITAMIN D3) 125 MCG (5000 UT) TABS Take 1 tablet by mouth daily.    . clobetasol ointment (TEMOVATE) 1.91 % Apply 1 application topically 2 (two) times daily. 60 g 1  . cyanocobalamin 500 MCG tablet Take 500 mcg by mouth daily.    . hydrALAZINE (APRESOLINE) 50 MG tablet Take 1 tablet (50 mg total) by mouth 3 (three) times daily. 270 tablet 1  . Multiple Vitamin (MULTIVITAMIN) tablet Take 1 tablet by mouth daily.     No facility-administered medications prior to visit.     ROS Review of Systems  Constitutional: Negative for diaphoresis, fatigue and unexpected weight change.  HENT: Negative.   Eyes: Negative for visual disturbance.  Respiratory: Negative for cough, chest tightness, shortness of breath and wheezing.   Cardiovascular: Negative for chest pain, palpitations and leg swelling.  Gastrointestinal: Negative for abdominal pain, diarrhea, nausea and vomiting.  Endocrine: Negative.   Genitourinary: Negative.  Negative for difficulty urinating.  Musculoskeletal: Negative.  Negative for arthralgias and neck stiffness.  Skin: Negative for color change.  Neurological: Negative.  Negative for dizziness and weakness.  Hematological: Negative for adenopathy. Does not bruise/bleed easily.  Psychiatric/Behavioral: Negative.     Objective:  BP 138/82 (BP Location: Left Arm, Patient Position: Sitting, Cuff Size: Normal)   Pulse 80   Temp  98.4 F (36.9 C) (Oral)   Resp 16   Ht 5\' 2"  (1.575 m)   Wt 173 lb 12 oz (78.8 kg)   SpO2 98%   BMI 31.78 kg/m   BP Readings from Last 3 Encounters:  02/12/19 138/82  01/12/19 124/80  12/30/18 (!) 164/96    Wt Readings from Last 3 Encounters:  02/12/19 173 lb 12 oz (78.8 kg)  01/12/19 170 lb (77.1 kg)  12/30/18 169 lb 8 oz (76.9 kg)    Physical Exam Constitutional:      Appearance: She is obese.  HENT:     Nose: Nose normal. No congestion or rhinorrhea.     Mouth/Throat:     Mouth: Mucous membranes are moist.     Pharynx: No oropharyngeal exudate.  Eyes:     General: No scleral icterus.    Conjunctiva/sclera: Conjunctivae normal.  Neck:     Musculoskeletal: Normal range of motion and neck supple. No muscular tenderness.  Cardiovascular:     Rate and Rhythm: Normal rate and regular rhythm.     Heart sounds: No murmur. No gallop.   Pulmonary:     Effort: Pulmonary effort is normal.     Breath sounds: No stridor. No wheezing, rhonchi or rales.  Abdominal:     General: Abdomen is flat. Bowel sounds are normal.     Palpations: There is no mass.     Tenderness: There is no abdominal tenderness. There is no guarding.  Musculoskeletal: Normal range of motion.        General: No swelling.     Right lower leg: No edema.  Left lower leg: No edema.  Lymphadenopathy:     Cervical: No cervical adenopathy.  Skin:    General: Skin is warm and dry.     Coloration: Skin is not pale.  Neurological:     General: No focal deficit present.  Psychiatric:        Mood and Affect: Mood normal.        Behavior: Behavior normal.     Lab Results  Component Value Date   WBC 4.9 12/05/2018   HGB 12.8 12/05/2018   HCT 39.7 12/05/2018   PLT 152 12/05/2018   GLUCOSE 87 02/12/2019   CHOL 218 (H) 04/17/2018   TRIG 142.0 04/17/2018   HDL 51.50 04/17/2018   LDLDIRECT 166.8 03/31/2013   LDLCALC 138 (H) 04/17/2018   ALT 13 04/17/2018   AST 16 04/17/2018   NA 140 02/12/2019   K  3.4 (L) 02/12/2019   CL 100 02/12/2019   CREATININE 1.27 (H) 02/12/2019   BUN 21 02/12/2019   CO2 31 02/12/2019   TSH 0.52 04/17/2018   HGBA1C 5.8 12/19/2016    Dg Chest 2 View  Result Date: 01/06/2019 CLINICAL DATA:  Cough since January. EXAM: CHEST - 2 VIEW COMPARISON:  12/04/2018 FINDINGS: The heart size and mediastinal contours are within normal limits. Both lungs are clear. The visualized skeletal structures are unremarkable. IMPRESSION: No active cardiopulmonary disease. Electronically Signed   By: Kathreen Devoid   On: 01/06/2019 08:30    Assessment & Plan:   Adrean was seen today for hypertension.  Diagnoses and all orders for this visit:  Essential hypertension- Her blood pressure is adequately well controlled.  Will continue the current regimen. -     Basic metabolic panel; Future -     potassium chloride SA (K-DUR) 20 MEQ tablet; Take 1 tablet (20 mEq total) by mouth 2 (two) times daily.  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  Her renal function is stable.  Will continue to maintain good blood pressure control.  Diuretic-induced hypokalemia -She has developed hypokalemia as a result of the thiazide diuretic.  I have asked her to start taking a potassium supplement to treat this. -     potassium chloride SA (K-DUR) 20 MEQ tablet; Take 1 tablet (20 mEq total) by mouth 2 (two) times daily.   I am having Verenis Gerdeman start on potassium chloride SA. I am also having her maintain her vitamin B-12, multivitamin, clobetasol ointment, Vitamin D3, hydrALAZINE, and chlorthalidone.  Meds ordered this encounter  Medications  . potassium chloride SA (K-DUR) 20 MEQ tablet    Sig: Take 1 tablet (20 mEq total) by mouth 2 (two) times daily.    Dispense:  180 tablet    Refill:  0     Follow-up: Return in about 6 months (around 08/14/2019).  [name], MD

## 2019-02-12 NOTE — Patient Instructions (Signed)

## 2019-03-24 DIAGNOSIS — H527 Unspecified disorder of refraction: Secondary | ICD-10-CM | POA: Diagnosis not present

## 2019-03-24 DIAGNOSIS — H02831 Dermatochalasis of right upper eyelid: Secondary | ICD-10-CM | POA: Diagnosis not present

## 2019-03-24 DIAGNOSIS — H11133 Conjunctival pigmentations, bilateral: Secondary | ICD-10-CM | POA: Diagnosis not present

## 2019-03-27 ENCOUNTER — Other Ambulatory Visit: Payer: Self-pay

## 2019-03-27 ENCOUNTER — Ambulatory Visit
Admission: RE | Admit: 2019-03-27 | Discharge: 2019-03-27 | Disposition: A | Discharge status: Home / Self Care | Payer: Medicare Other | Source: Ambulatory Visit | Attending: Internal Medicine | Admitting: Internal Medicine

## 2019-03-27 DIAGNOSIS — Z1231 Encounter for screening mammogram for malignant neoplasm of breast: Secondary | ICD-10-CM | POA: Diagnosis not present

## 2019-03-27 LAB — HM MAMMOGRAPHY

## 2019-05-04 ENCOUNTER — Other Ambulatory Visit: Payer: Self-pay | Admitting: Internal Medicine

## 2019-05-04 DIAGNOSIS — I10 Essential (primary) hypertension: Secondary | ICD-10-CM

## 2019-05-15 ENCOUNTER — Emergency Department (HOSPITAL_COMMUNITY)
Admission: EM | Admit: 2019-05-15 | Discharge: 2019-05-15 | Disposition: A | Discharge status: Home / Self Care | Payer: Medicare Other | Attending: Emergency Medicine | Admitting: Emergency Medicine

## 2019-05-15 ENCOUNTER — Other Ambulatory Visit: Payer: Self-pay

## 2019-05-15 DIAGNOSIS — R55 Syncope and collapse: Secondary | ICD-10-CM

## 2019-05-15 DIAGNOSIS — Z85528 Personal history of other malignant neoplasm of kidney: Secondary | ICD-10-CM | POA: Insufficient documentation

## 2019-05-15 DIAGNOSIS — Z79899 Other long term (current) drug therapy: Secondary | ICD-10-CM | POA: Insufficient documentation

## 2019-05-15 DIAGNOSIS — N183 Chronic kidney disease, stage 3 (moderate): Secondary | ICD-10-CM | POA: Insufficient documentation

## 2019-05-15 DIAGNOSIS — I129 Hypertensive chronic kidney disease with stage 1 through stage 4 chronic kidney disease, or unspecified chronic kidney disease: Secondary | ICD-10-CM | POA: Diagnosis not present

## 2019-05-15 DIAGNOSIS — Z91040 Latex allergy status: Secondary | ICD-10-CM | POA: Diagnosis not present

## 2019-05-15 LAB — CBC WITH DIFFERENTIAL/PLATELET
Abs Immature Granulocytes: 0.02 10*3/uL (ref 0.00–0.07)
Basophils Absolute: 0 10*3/uL (ref 0.0–0.1)
Basophils Relative: 0 %
Eosinophils Absolute: 0.1 10*3/uL (ref 0.0–0.5)
Eosinophils Relative: 2 %
HCT: 42.8 % (ref 36.0–46.0)
Hemoglobin: 14.2 g/dL (ref 12.0–15.0)
Immature Granulocytes: 0 %
Lymphocytes Relative: 23 %
Lymphs Abs: 1 10*3/uL (ref 0.7–4.0)
MCH: 30.5 pg (ref 26.0–34.0)
MCHC: 33.2 g/dL (ref 30.0–36.0)
MCV: 91.8 fL (ref 80.0–100.0)
Monocytes Absolute: 0.7 10*3/uL (ref 0.1–1.0)
Monocytes Relative: 15 %
Neutro Abs: 2.8 10*3/uL (ref 1.7–7.7)
Neutrophils Relative %: 60 %
Platelets: 160 10*3/uL (ref 150–400)
RBC: 4.66 MIL/uL (ref 3.87–5.11)
RDW: 13.5 % (ref 11.5–15.5)
WBC: 4.6 10*3/uL (ref 4.0–10.5)
nRBC: 0 % (ref 0.0–0.2)

## 2019-05-15 LAB — URINALYSIS, ROUTINE W REFLEX MICROSCOPIC
Bilirubin Urine: NEGATIVE
Glucose, UA: NEGATIVE mg/dL
Hgb urine dipstick: NEGATIVE
Ketones, ur: NEGATIVE mg/dL
Leukocytes,Ua: NEGATIVE
Nitrite: NEGATIVE
Protein, ur: NEGATIVE mg/dL
Specific Gravity, Urine: 1.017 (ref 1.005–1.030)
pH: 6 (ref 5.0–8.0)

## 2019-05-15 LAB — CBG MONITORING, ED: Glucose-Capillary: 103 mg/dL — ABNORMAL HIGH (ref 70–99)

## 2019-05-15 LAB — COMPREHENSIVE METABOLIC PANEL
ALT: 14 U/L (ref 0–44)
AST: 20 U/L (ref 15–41)
Albumin: 3.9 g/dL (ref 3.5–5.0)
Alkaline Phosphatase: 68 U/L (ref 38–126)
Anion gap: 10 (ref 5–15)
BUN: 19 mg/dL (ref 8–23)
CO2: 29 mmol/L (ref 22–32)
Calcium: 10.3 mg/dL (ref 8.9–10.3)
Chloride: 101 mmol/L (ref 98–111)
Creatinine, Ser: 1.3 mg/dL — ABNORMAL HIGH (ref 0.44–1.00)
GFR calc Af Amer: 50 mL/min — ABNORMAL LOW (ref >60)
GFR calc non Af Amer: 43 mL/min — ABNORMAL LOW (ref >60)
Glucose, Bld: 101 mg/dL — ABNORMAL HIGH (ref 70–99)
Potassium: 3.4 mmol/L — ABNORMAL LOW (ref 3.5–5.1)
Sodium: 140 mmol/L (ref 135–145)
Total Bilirubin: 0.7 mg/dL (ref 0.3–1.2)
Total Protein: 6.6 g/dL (ref 6.5–8.1)

## 2019-05-15 NOTE — Discharge Instructions (Addendum)
You have been seen today for loss of consciousness. It is possible to have another episode of loss of consciousness. Do not drive until you follow up with your PCP and avoid using ladders/working at heights. Please read and follow all provided instructions.   1. Medications: usual home medications 2. Treatment: rest, drink plenty of fluids 3. Follow Up: Please follow up with your primary doctor in 2 days for discussion of your diagnoses and further evaluation after today's visit; if you do not have a primary care doctor use the resource guide provided to find one; Please return to the ER for any new or worsening symptoms. Please obtain all of your results from medical records or have your doctors office obtain the results - share them with your doctor - you should be seen at your doctors office. Call today to arrange your follow up.   Take medications as prescribed. Please review all of the medicines and only take them if you do not have an allergy to them. Return to the emergency room for worsening condition or new concerning symptoms. Follow up with your regular doctor. If you don't have a regular doctor use one of the numbers below to establish a primary care doctor.  Please be aware that if you are taking birth control pills, taking other prescriptions, ESPECIALLY ANTIBIOTICS may make the birth control ineffective - if this is the case, either do not engage in sexual activity or use alternative methods of birth control such as condoms until you have finished the medicine and your family doctor says it is OK to restart them. If you are on a blood thinner such as COUMADIN, be aware that any other medicine that you take may cause the coumadin to either work too much, or not enough - you should have your coumadin level rechecked in next 7 days if this is the case.  ?  It is also a possibility that you have an allergic reaction to any of the medicines that you have been prescribed - Everybody reacts  differently to medications and while MOST people have no trouble with most medicines, you may have a reaction such as nausea, vomiting, rash, swelling, shortness of breath. If this is the case, please stop taking the medicine immediately and contact your physician.  ?  You should return to the ER if you develop severe or worsening symptoms.   Emergency Department Resource Guide 1) Find a Doctor and Pay Out of Pocket Although you won't have to find out who is covered by your insurance plan, it is a good idea to ask around and get recommendations. You will then need to call the office and see if the doctor you have chosen will accept you as a new patient and what types of options they offer for patients who are self-pay. Some doctors offer discounts or will set up payment plans for their patients who do not have insurance, but you will need to ask so you aren't surprised when you get to your appointment.  2) Contact Your Local Health Department Not all health departments have doctors that can see patients for sick visits, but many do, so it is worth a call to see if yours does. If you don't know where your local health department is, you can check in your phone book. The CDC also has a tool to help you locate your state's health department, and many state websites also have listings of all of their local health departments.  3) Find a Walk-in  Clinic If your illness is not likely to be very severe or complicated, you may want to try a walk in clinic. These are popping up all over the country in pharmacies, drugstores, and shopping centers. They're usually staffed by nurse practitioners or physician assistants that have been trained to treat common illnesses and complaints. They're usually fairly quick and inexpensive. However, if you have serious medical issues or chronic medical problems, these are probably not your best option.  No Primary Care Doctor: Call [facility] at  [phone] - they can help  you locate a primary care doctor that  accepts your insurance, provides certain services, etc. Physician Referral Service- [phone]  Chronic Pain Problems: Organization         Address  Phone   Notes  [facility]  [phone] Patients need to be referred by their primary care doctor.   Medication Assistance: Organization         Address  Phone   Notes  [facility] Medication [facility] [address] [place], [place] [address] [phone] --Must be a resident of [facility] -- Must have NO insurance coverage whatsoever (no Medicaid/ Medicare, etc.) -- The pt. MUST have a primary care doctor that directs their care regularly and follows them in the community   [facility]  [phone]   [employer]  [phone]    Agencies that provide inexpensive medical care: Organization         Address  Phone   Notes  [place]  [phone]   [facility]    [phone]   [facility] [place], [place] [address] [phone]   [place] [address], [place] [phone]   [facility]    [phone]   [facility]    [phone]   [place] [address], [place] Phone:  [phone], Fax:  [phone] Hours of Operation:  9 am - 6 pm, M-F.  Also accepts Medicaid/Medicare and self-pay.  [facility] for [place] [address], [address], [place] Phone: [phone], Fax: [phone]. Hours of Operation:  8:30 am - 5:30 pm, M-F.  Also accepts Medicaid and self-pay.  [facility] [address], [place] Phone: [phone]   [place], [place], [place] [phone], Ext. [phone] Mondays & Thursdays: 7-9 AM.  First 15 patients are seen on a first come, first serve basis.    [place]  Providers:  Organization         Address  Phone   Notes  [facility] [address], [place] [phone] Also accepts self-pay patients.  [facility] [address], [place]  [phone]   [place], [address], [place] [phone]   [facility] [address], [place] [phone]   Lucianne Lei [address], [place]   [phone] Only accepts [place] [facility] [place] patients after they have their name applied to their card.   Self-Pay (no insurance) in [facility]:  Organization         Address  Phone   Notes  Sickle Cell Patients, [facility] [place], [place] [phone]  [facility] [place] [phone]   [facility] [address] [place]  [place], [address], [place] [phone]   [facility]/Dr. Osei-Bonsu  [address], [place] or [address], [place] [phone] Phone number for both [place] and [place] locations is the same.  [facility] and [facility] [address] [place] [phone]   [facility] [address], [place] or [address] [phone]   [facility] [address] [place] [phone], phone; [phone], fax Sees patients 1st and 3rd Saturday of every month.  Must not qualify for public or private insurance (i.e. Medicaid, Medicare, [place] Health Choice, Veterans' Benefits)  Household income should be no more than 200% of the poverty level The clinic cannot treat you if you are pregnant or think you are pregnant  Sexually transmitted diseases are not treated at the clinic.

## 2019-05-15 NOTE — ED Notes (Signed)
Pt speaking to family via ED phone, given update and arranging ride home

## 2019-05-15 NOTE — ED Provider Notes (Signed)
[facility] EMERGENCY DEPARTMENT Provider Note   CSN: [id number] Arrival date & time: 05/15/19  1002    History   Chief Complaint Chief Complaint  Patient presents with  . Loss of Consciousness    HPI Teresa Collins is a 68 y.o. female with a PMH of GERD, HTN, HLD, and left renal cancer in 2015 presents to the ER due to a syncopal episode last night. Patient reports she was taking a bath and when she stood up, she felt lightheaded and had a syncopal episode for a few seconds. Patient reports she sat down back in the tub when the incident happened, but denies head injury. Patient states she had multiple episodes of nausea and vomiting after the syncopal episode. Patient reports drinking milk and a glass of wine prior to episode. Patient denies any current nausea or vomiting. Patient reports she had mild upper abdominal pain yesterday, but abdominal pain has completely resolved. Patient denies chest pain, shortness of breath, fever, cough, congestion, sick exposures, or recent travel. Patient denies weakness, numbness, vision changes, headaches, or current dizziness/lightheadedness. Patient states she has had similar episodes intermittently in the past over the years with normal work ups. Patient denies any new medications. Patient denies any symptoms currently. Patient reports occasional alcohol use, but denies drug use.      HPI  Past Medical History:  Diagnosis Date  . Arthritis   . ASCUS favor benign 12/2015   negative high risk HPV. Recommend repeat Pap smear in one year  . ASCUS of cervix with negative high risk HPV 12/2018  . Eczema   . GERD (gastroesophageal reflux disease)   . Hx of colonic polyps   . Hyperlipidemia   . Hypertension   . Incisional hernia   . Numbness and tingling    RT THIGH  . Renal cancer ([place]) 2015   Left  . STD (sexually transmitted disease)    GC    Patient Active Problem List   Diagnosis Date Noted  . Chronic renal disease,  stage 3, moderately decreased glomerular filtration rate (GFR) between 30-59 mL/min/1.73 square meter ([place]) 02/12/2019  . Diuretic-induced hypokalemia 02/12/2019  . 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  . IBS (irritable bowel syndrome) 12/06/2015  . 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  . Visit for screening mammogram 11/29/2015  . Atopic eczema 11/29/2015  . Renal cell carcinoma ([place]) 05/27/2014  . Hyperlipidemia with target LDL less than 130 08/13/2013  . Routine general medical examination at a health care facility 03/31/2013  . OA (osteoarthritis) 01/17/2012  . Vitamin D deficiency 11/01/2010  . GERD 10/12/2008  . Essential hypertension 10/11/2008    Past Surgical History:  Procedure Laterality Date  . abortions    . CARPAL TUNNEL RELEASE Left 2017  . CESAREAN SECTION     1 time  . INSERTION OF MESH N/A 07/21/2014   Procedure: INSERTION OF MESH;  Surgeon: Coralie Keens, MD;  Location: [facility];  Service: General;  Laterality: N/A;  . MYOMECTOMY ABDOMINAL APPROACH    . ROBOTIC ASSITED PARTIAL NEPHRECTOMY Left 07/21/2014   Procedure: ROBOTIC ASSISTED LEFT PARTIAL NEPHRECTOMY;  Surgeon: Ardis Hughs, MD;  Location: [facility];  Service: Urology;  Laterality: Left;  . TUBAL LIGATION    . VENTRAL HERNIA REPAIR N/A 07/21/2014   Procedure: INCISIONAL HERNIA REPAIR WITH MESH ;  Surgeon: Coralie Keens, MD;  Location: [facility];  Service: General;  Laterality: N/A;     OB History    Durenda Age  4   Para  1   Term      Preterm      AB  2   Living  1     SAB      TAB      Ectopic      Multiple      Live Births               Home Medications    Prior to Admission medications   Medication Sig Start Date End Date Taking? Authorizing Provider  chlorthalidone (HYGROTON) 25 MG tablet Take 1 tablet by mouth once daily 05/04/19   Janith Lima, MD  Cholecalciferol (VITAMIN D3) 125 MCG (5000 UT) TABS Take 1 tablet by mouth daily.    [provider]  clobetasol ointment (TEMOVATE) 7.01 % Apply 1 application topically 2 (two) times daily. 11/25/18   Janith Lima, MD  cyanocobalamin 500 MCG tablet Take 500 mcg by mouth daily.    [provider]  hydrALAZINE (APRESOLINE) 50 MG tablet Take 1 tablet (50 mg total) by mouth 3 (three) times daily. 12/30/18   Janith Lima, MD  Multiple Vitamin (MULTIVITAMIN) tablet Take 1 tablet by mouth daily.    [provider]  potassium chloride SA (K-DUR) 20 MEQ tablet Take 1 tablet (20 mEq total) by mouth 2 (two) times daily. 02/12/19   Janith Lima, MD    Family History Family History  Problem Relation Age of Onset  . Hypertension Mother   . Hypertension Sister   . Hypertension Brother   . Colon cancer Neg Hx   . Esophageal cancer Neg Hx   . Rectal cancer Neg Hx   . Stomach cancer Neg Hx   . Breast cancer Neg Hx     Social History Social History   Tobacco Use  . Smoking status: Never Smoker  . Smokeless tobacco: Never Used  Substance Use Topics  . Alcohol use: Yes    Alcohol/week: 0.0 standard drinks    Comment: RARE  . Drug use: Never     Allergies   Amlodipine, Lipitor [atorvastatin], Bystolic [nebivolol hcl], Codeine sulfate, and Latex   Review of Systems Review of Systems  Constitutional: Negative for activity change, appetite change, chills, diaphoresis, fatigue, fever and unexpected weight change.  HENT: Negative for congestion, rhinorrhea and sore throat.   Eyes: Negative for visual disturbance.  Respiratory: Negative for cough and shortness of breath.   Cardiovascular: Negative for chest pain, palpitations and leg swelling.  Gastrointestinal: Positive for abdominal pain, nausea and vomiting. Negative for blood in stool and diarrhea.       GI symptoms have completely resolved today.   Genitourinary: Negative for dysuria, flank pain, frequency and hematuria.  Skin: Negative for pallor.  Allergic/Immunologic: Negative for  immunocompromised state.  Neurological: Positive for dizziness, syncope and light-headedness. Negative for tremors, seizures, facial asymmetry, speech difficulty, weakness, numbness and headaches.  Hematological: Does not bruise/bleed easily.  Psychiatric/Behavioral: Negative for sleep disturbance. The patient is not nervous/anxious.      Physical Exam Updated Vital Signs BP 136/75   Pulse (!) 55   Temp 97.8 F (36.6 C) (Oral)   Resp 13   SpO2 100%   Physical Exam Vitals signs and nursing note reviewed.  Constitutional:      General: She is not in acute distress.    Appearance: She is well-developed. She is not diaphoretic.  HENT:     Head: Normocephalic and atraumatic. No raccoon eyes or  Battle's sign.     Right Ear: Tympanic membrane, ear canal and external ear normal. No hemotympanum.     Left Ear: Tympanic membrane, ear canal and external ear normal. No hemotympanum.     Nose: Nose normal. No congestion or rhinorrhea.     Mouth/Throat:     Mouth: Mucous membranes are moist.     Pharynx: No oropharyngeal exudate or posterior oropharyngeal erythema.  Eyes:     Extraocular Movements: Extraocular movements intact.     Conjunctiva/sclera: Conjunctivae normal.     Pupils: Pupils are equal, round, and reactive to light.  Neck:     Musculoskeletal: Normal range of motion.  Cardiovascular:     Rate and Rhythm: Normal rate and regular rhythm.     Heart sounds: Normal heart sounds. No murmur. No friction rub. No gallop.   Pulmonary:     Effort: Pulmonary effort is normal. No respiratory distress.     Breath sounds: Normal breath sounds. No wheezing or rales.  Abdominal:     Palpations: Abdomen is soft.     Tenderness: There is no abdominal tenderness.  Musculoskeletal: Normal range of motion.  Skin:    General: Skin is warm.     Findings: No erythema or rash.  Neurological:     Mental Status: She is alert.    Mental Status:  Alert, oriented, thought content appropriate,  able to give a coherent history. Speech fluent without evidence of aphasia. Able to follow 2 step commands without difficulty.  Cranial Nerves:  II:  Peripheral visual fields grossly normal, pupils equal, round, reactive to light III,IV, VI: ptosis not present, extra-ocular motions intact bilaterally  V,VII: smile symmetric, facial light touch sensation equal VIII: hearing grossly normal to voice  IX,X: symmetric elevation of soft palate, uvula elevates symmetrically  XI: bilateral shoulder shrug symmetric and strong XII: midline tongue extension without fassiculations Motor:  Normal tone. 5/5 in upper and lower extremities bilaterally including strong and equal grip strength and dorsiflexion/plantar flexion Sensory: Pinprick and light touch normal in all extremities.  Deep Tendon Reflexes: 2+ and symmetric in the biceps and patella Cerebellar: normal finger-to-nose with bilateral upper extremities Gait: normal gait and balance.  Negative pronator drift. Negative Romberg sign. CV: distal pulses palpable throughout    ED Treatments / Results  Labs (all labs ordered are listed, but only abnormal results are displayed) Labs Reviewed  COMPREHENSIVE METABOLIC PANEL - Abnormal; Notable for the following components:      Result Value   Potassium 3.4 (*)    Glucose, Bld 101 (*)    Creatinine, Ser 1.30 (*)    GFR calc non Af Amer 43 (*)    GFR calc Af Amer 50 (*)    All other components within normal limits  CBG MONITORING, ED - Abnormal; Notable for the following components:   Glucose-Capillary 103 (*)    All other components within normal limits  CBC WITH DIFFERENTIAL/PLATELET  URINALYSIS, ROUTINE W REFLEX MICROSCOPIC    EKG EKG Interpretation  Date/Time:  Friday May 15 2019 10:12:04 EDT Ventricular Rate:  59 PR Interval:    QRS Duration: 102 QT Interval:  415 QTC Calculation: 412 R Axis:   78 Text Interpretation:  Sinus rhythm No significant change since last tracing  Confirmed by Dorie Rank [phone]) on 05/15/2019 10:17:43 AM   Radiology No results found.  Procedures Procedures (including critical care time)  Medications Ordered in ED Medications - No data to display   Initial Impression / Assessment  and Plan / ED Course  I have reviewed the triage vital signs and the nursing notes.  Pertinent labs & imaging results that were available during my care of the patient were reviewed by me and considered in my medical decision making (see chart for details).  Clinical Course as of May 14 1353  Fri May 15, 2019  1154 UA is unremarkable.  Urinalysis, Routine w reflex microscopic [AH]  1154 CBC is within normal limits.  CBC WITH DIFFERENTIAL [AH]  1312 Elevated creatinine at 1.30. This appears to be similar to previous values. Will advise patient to follow up with PCP.  Creatinine(!): 1.30 [AH]  1312 Hypokalemia noted at 3.4. This appears to be baseline.  Potassium(!): 3.4 [AH]  1347 Reassessed patient. Patient continues to be completely asymptomatic. Patient is playing games on phone in no acute distress.    [AH]  1353 Attempted to call and update family twice. No response.   [AH]    Clinical Course User Index [AH] Arville Lime, PA-C      Patient without arrhythmia or tachycardia while here in the department.  Patient without history of congestive heart failure, normal hematocrit, normal ECG, no shortness of breath and systolic blood pressure greater than 90; patient is low risk. Will plan for discharge home with close PCP follow-up.  Possibility of recurrent syncope has been discussed. I discussed reasons to avoid driving until PCP followup and other safety prevention including use of ladders and working at heights.   Pt has remained hemodynamically stable throughout their time in the ED  BP 136/75   Pulse (!) 55   Temp 97.8 F (36.6 C) (Oral)   Resp 13   SpO2 100%   The patient was discussed with Dr. Tomi Bamberger.  Final Clinical  Impressions(s) / ED Diagnoses   Final diagnoses:  Syncope, unspecified syncope type    ED Discharge Orders    None       Arville Lime, [place] 05/15/19 1355    Dorie Rank, MD 05/16/19 [phone]

## 2019-05-15 NOTE — ED Notes (Addendum)
Patient verbalizes understanding of discharge instructions. Opportunity for questioning and answers were provided. Armband removed by staff, pt discharged from ED.   Pt unable to sign due to lack of working signature pad.

## 2019-05-15 NOTE — ED Triage Notes (Signed)
Pt presents from home via POV for syncope. Reports syncope last night while taking a bath, vomited x1 after this incident, denies hitting her head. Has been feeling abd discomfort since Tuesday (mild today), has noticed a change in her taste ("things taste spoiled"). Pt had breakfast this am without issue. Takes chlorthalidone and hydralazine daily.

## 2019-05-19 ENCOUNTER — Ambulatory Visit (INDEPENDENT_AMBULATORY_CARE_PROVIDER_SITE_OTHER): Payer: Medicare Other | Admitting: Internal Medicine

## 2019-05-19 ENCOUNTER — Encounter: Payer: Self-pay | Admitting: Internal Medicine

## 2019-05-19 ENCOUNTER — Other Ambulatory Visit: Payer: Self-pay

## 2019-05-19 ENCOUNTER — Other Ambulatory Visit (INDEPENDENT_AMBULATORY_CARE_PROVIDER_SITE_OTHER): Payer: Medicare Other

## 2019-05-19 VITALS — BP 144/82 | HR 73 | Temp 98.9°F | Resp 16 | Ht 62.0 in | Wt 177.0 lb

## 2019-05-19 DIAGNOSIS — E876 Hypokalemia: Secondary | ICD-10-CM

## 2019-05-19 DIAGNOSIS — N183 Chronic kidney disease, stage 3 unspecified: Secondary | ICD-10-CM

## 2019-05-19 DIAGNOSIS — Z0000 Encounter for general adult medical examination without abnormal findings: Secondary | ICD-10-CM

## 2019-05-19 DIAGNOSIS — T502X5A Adverse effect of carbonic-anhydrase inhibitors, benzothiadiazides and other diuretics, initial encounter: Secondary | ICD-10-CM

## 2019-05-19 DIAGNOSIS — E559 Vitamin D deficiency, unspecified: Secondary | ICD-10-CM

## 2019-05-19 DIAGNOSIS — E785 Hyperlipidemia, unspecified: Secondary | ICD-10-CM | POA: Diagnosis not present

## 2019-05-19 DIAGNOSIS — I10 Essential (primary) hypertension: Secondary | ICD-10-CM

## 2019-05-19 LAB — LIPID PANEL
Cholesterol: 202 mg/dL — ABNORMAL HIGH (ref 0–200)
HDL: 65.5 mg/dL (ref >39.00)
LDL Cholesterol: 112 mg/dL — ABNORMAL HIGH (ref 0–99)
NonHDL: 136.14
Total CHOL/HDL Ratio: 3
Triglycerides: 120 mg/dL (ref 0.0–149.0)
VLDL: 24 mg/dL (ref 0.0–40.0)

## 2019-05-19 LAB — BASIC METABOLIC PANEL
BUN: 18 mg/dL (ref 6–23)
CO2: 35 mEq/L — ABNORMAL HIGH (ref 19–32)
Calcium: 10.7 mg/dL — ABNORMAL HIGH (ref 8.4–10.5)
Chloride: 99 mEq/L (ref 96–112)
Creatinine, Ser: 1.21 mg/dL — ABNORMAL HIGH (ref 0.40–1.20)
GFR: 53.73 mL/min — ABNORMAL LOW (ref >60.00)
Glucose, Bld: 110 mg/dL — ABNORMAL HIGH (ref 70–99)
Potassium: 3.5 mEq/L (ref 3.5–5.1)
Sodium: 139 mEq/L (ref 135–145)

## 2019-05-19 LAB — URINALYSIS, ROUTINE W REFLEX MICROSCOPIC
Bilirubin Urine: NEGATIVE
Hgb urine dipstick: NEGATIVE
Ketones, ur: NEGATIVE
Leukocytes,Ua: NEGATIVE
Nitrite: NEGATIVE
RBC / HPF: NONE SEEN (ref 0–?)
Specific Gravity, Urine: 1.02 (ref 1.000–1.030)
Total Protein, Urine: NEGATIVE
Urine Glucose: NEGATIVE
Urobilinogen, UA: 1 (ref 0.0–1.0)
WBC, UA: NONE SEEN (ref 0–?)
pH: 6.5 (ref 5.0–8.0)

## 2019-05-19 LAB — HEPATIC FUNCTION PANEL
ALT: 12 U/L (ref 0–35)
AST: 16 U/L (ref 0–37)
Albumin: 4.4 g/dL (ref 3.5–5.2)
Alkaline Phosphatase: 70 U/L (ref 39–117)
Bilirubin, Direct: 0.1 mg/dL (ref 0.0–0.3)
Total Bilirubin: 0.5 mg/dL (ref 0.2–1.2)
Total Protein: 7.6 g/dL (ref 6.0–8.3)

## 2019-05-19 LAB — MAGNESIUM: Magnesium: 2 mg/dL (ref 1.5–2.5)

## 2019-05-19 LAB — TSH: TSH: 0.45 u[IU]/mL (ref 0.35–4.50)

## 2019-05-19 LAB — VITAMIN D 25 HYDROXY (VIT D DEFICIENCY, FRACTURES): VITD: 72.43 ng/mL (ref 30.00–100.00)

## 2019-05-19 NOTE — Patient Instructions (Signed)

## 2019-05-19 NOTE — Progress Notes (Signed)
Subjective:  Patient ID: Teresa Collins, female    DOB: 29-Nov-1950  Age: 68 y.o. MRN: [id number]  CC: Annual Exam, Hypertension, and Hyperlipidemia   HPI Teresa Collins presents for a CPX.  She was seen in the ED a few days ago after having a syncopal episode.  She tells me that she had had nausea, vomiting, and diarrhea for about 2 days when she had the spell.  She tells me all of those symptoms have resolved.  She has had no more syncopal episodes and she denies dizziness or lightheadedness.  She complains of weight gain.  Outpatient Medications Prior to Visit  Medication Sig Dispense Refill  . chlorthalidone (HYGROTON) 25 MG tablet Take 1 tablet by mouth once daily 90 tablet 0  . Cholecalciferol (VITAMIN D3) 125 MCG (5000 UT) TABS Take 1 tablet by mouth daily.    . clobetasol ointment (TEMOVATE) 6.16 % Apply 1 application topically 2 (two) times daily. 60 g 1  . cyanocobalamin 500 MCG tablet Take 500 mcg by mouth daily.    . hydrALAZINE (APRESOLINE) 50 MG tablet Take 1 tablet (50 mg total) by mouth 3 (three) times daily. 270 tablet 1  . Multiple Vitamin (MULTIVITAMIN) tablet Take 1 tablet by mouth daily.    . potassium chloride SA (K-DUR) 20 MEQ tablet Take 1 tablet (20 mEq total) by mouth 2 (two) times daily. 180 tablet 0   No facility-administered medications prior to visit.     ROS Review of Systems  Constitutional: Positive for unexpected weight change (wt gain). Negative for diaphoresis and fatigue.  HENT: Negative.   Eyes: Negative.   Respiratory: Negative for cough, chest tightness, shortness of breath and wheezing.   Cardiovascular: Negative for chest pain, palpitations and leg swelling.  Gastrointestinal: Negative for abdominal pain, constipation, diarrhea, nausea and vomiting.  Endocrine: Negative.   Genitourinary: Negative.  Negative for difficulty urinating, dysuria, hematuria and urgency.  Musculoskeletal: Negative.  Negative for arthralgias and myalgias.   Skin: Negative for color change, pallor and rash.  Neurological: Negative.  Negative for dizziness, weakness, light-headedness and headaches.  Hematological: Negative for adenopathy. Does not bruise/bleed easily.  Psychiatric/Behavioral: Negative.     Objective:  BP (!) 144/82 (BP Location: Left Arm, Patient Position: Sitting, Cuff Size: Normal)   Pulse 73   Temp 98.9 F (37.2 C) (Oral)   Resp 16   Ht 5\' 2"  (1.575 m)   Wt 177 lb (80.3 kg)   SpO2 96%   BMI 32.37 kg/m   BP Readings from Last 3 Encounters:  05/19/19 (!) 144/82  05/15/19 (!) 146/69  02/12/19 138/82    Wt Readings from Last 3 Encounters:  05/19/19 177 lb (80.3 kg)  02/12/19 173 lb 12 oz (78.8 kg)  01/12/19 170 lb (77.1 kg)    Physical Exam Vitals signs reviewed.  Constitutional:      General: She is not in acute distress.    Appearance: She is obese. She is not ill-appearing, toxic-appearing or diaphoretic.  HENT:     Nose: Nose normal.     Mouth/Throat:     Mouth: Mucous membranes are moist.     Pharynx: No oropharyngeal exudate or posterior oropharyngeal erythema.  Eyes:     General: No scleral icterus.    Conjunctiva/sclera: Conjunctivae normal.  Neck:     Musculoskeletal: Normal range of motion. No neck rigidity or muscular tenderness.  Cardiovascular:     Rate and Rhythm: Normal rate and regular rhythm.     Heart sounds:  No murmur. No gallop.   Pulmonary:     Effort: Pulmonary effort is normal.     Breath sounds: No stridor. No wheezing, rhonchi or rales.  Abdominal:     General: Abdomen is protuberant. Bowel sounds are normal. There is no distension.     Palpations: There is no hepatomegaly, splenomegaly or mass.     Tenderness: There is no abdominal tenderness.  Genitourinary:    Comments: Breast, GU, rectal exams were deferred at her request. Musculoskeletal: Normal range of motion.     Right lower leg: No edema.     Left lower leg: No edema.  Lymphadenopathy:     Cervical: No cervical  adenopathy.  Skin:    General: Skin is warm and dry.     Coloration: Skin is not pale.  Neurological:     General: No focal deficit present.     Mental Status: Mental status is at baseline.  Psychiatric:        Mood and Affect: Mood normal.        Behavior: Behavior normal.     Lab Results  Component Value Date   WBC 4.6 05/15/2019   HGB 14.2 05/15/2019   HCT 42.8 05/15/2019   PLT 160 05/15/2019   GLUCOSE 110 (H) 05/19/2019   CHOL 202 (H) 05/19/2019   TRIG 120.0 05/19/2019   HDL 65.50 05/19/2019   LDLDIRECT 166.8 03/31/2013   LDLCALC 112 (H) 05/19/2019   ALT 12 05/19/2019   AST 16 05/19/2019   NA 139 05/19/2019   K 3.5 05/19/2019   CL 99 05/19/2019   CREATININE 1.21 (H) 05/19/2019   BUN 18 05/19/2019   CO2 35 (H) 05/19/2019   TSH 0.45 05/19/2019   HGBA1C 5.8 12/19/2016    No results found.  Assessment & Plan:   Teresa Collins was seen today for annual exam, hypertension and hyperlipidemia.  Diagnoses and all orders for this visit:  Essential hypertension- Her blood pressure is adequately well controlled. -     Cancel: CBC with Differential/Platelet; Future -     Urinalysis, Routine w reflex microscopic; Future -     Magnesium; Future  Diuretic-induced hypokalemia- Her potassium level is in the low normal range at 3.5.  I have asked her to continue taking the current potassium supplement. -     Basic metabolic panel; Future -     Magnesium; Future  Hyperlipidemia with target LDL less than 130- Her ASCVD risk or is less than 15% so I did not recommend a statin for CV risk reduction. -     Hepatic function panel; Future -     TSH; Future -     Urinalysis, Routine w reflex microscopic; Future -     Lipid panel; Future  Vitamin D deficiency -     VITAMIN D 25 Hydroxy (Vit-D Deficiency, Fractures); Future  Chronic renal disease, stage 3, moderately decreased glomerular filtration rate (GFR) between 30-59 mL/min/1.73 square meter ([place])- Her renal function is stable.   Will continue to maintain blood pressure control.  I have asked her to avoid nephrotoxic agents. -     Basic metabolic panel; Future -     Urinalysis, Routine w reflex microscopic; Future  Routine general medical examination at a health care facility- Exam completed, labs reviewed, vaccines reviewed, colon cancer screening/mammogram/Pap are all up-to-date, patient education material was given.  Vitamin D deficiency disease   I am having Teresa Collins maintain her vitamin B-12, multivitamin, clobetasol ointment, Vitamin D3, hydrALAZINE, chlorthalidone, and  potassium chloride SA.  Meds ordered this encounter  Medications  . potassium chloride SA (K-DUR) 20 MEQ tablet    Sig: Take 1 tablet (20 mEq total) by mouth 2 (two) times daily.    Dispense:  180 tablet    Refill:  1     Follow-up: Return in about 6 months (around 11/19/2019).  Teresa Calico, MD

## 2019-05-21 ENCOUNTER — Encounter: Payer: Self-pay | Admitting: Internal Medicine

## 2019-05-21 MED ORDER — POTASSIUM CHLORIDE CRYS ER 20 MEQ PO TBCR: 20.0000 meq | EXTENDED_RELEASE_TABLET | Freq: Two times a day (BID) | ORAL | 1 refills | Status: DC

## 2019-07-29 ENCOUNTER — Encounter: Payer: Self-pay | Admitting: Gynecology

## 2019-07-30 ENCOUNTER — Other Ambulatory Visit: Payer: Self-pay | Admitting: Internal Medicine

## 2019-07-30 DIAGNOSIS — M1612 Unilateral primary osteoarthritis, left hip: Secondary | ICD-10-CM

## 2019-07-30 DIAGNOSIS — M25552 Pain in left hip: Secondary | ICD-10-CM

## 2019-07-30 DIAGNOSIS — I10 Essential (primary) hypertension: Secondary | ICD-10-CM

## 2019-07-31 ENCOUNTER — Other Ambulatory Visit: Payer: Self-pay | Admitting: Internal Medicine

## 2019-07-31 DIAGNOSIS — I10 Essential (primary) hypertension: Secondary | ICD-10-CM

## 2019-07-31 MED ORDER — CHLORTHALIDONE 25 MG PO TABS: 25.0000 mg | ORAL_TABLET | Freq: Every day | ORAL | 0 refills | Status: DC

## 2019-07-31 MED ORDER — HYDRALAZINE HCL 50 MG PO TABS: 50.0000 mg | ORAL_TABLET | Freq: Three times a day (TID) | ORAL | 1 refills | Status: DC

## 2019-09-15 ENCOUNTER — Telehealth: Payer: Self-pay

## 2019-09-15 DIAGNOSIS — Z23 Encounter for immunization: Secondary | ICD-10-CM

## 2019-09-15 MED ORDER — ZOSTER VAC RECOMB ADJUVANTED 50 MCG/0.5ML IM SUSR: 0.5000 mL | Freq: Once | INTRAMUSCULAR | 1 refills | Status: AC

## 2019-09-15 NOTE — Telephone Encounter (Signed)
Patient informed. 

## 2019-09-15 NOTE — Telephone Encounter (Signed)
Shingles vaccine has been sent as requested.

## 2019-09-15 NOTE — Telephone Encounter (Signed)
Copied from [facility] [id number]. Topic: Appointment Scheduling - Prior Auth Required for Appointment >> Sep 15, 2019 10:36 AM Robina Ade, Helene Kelp D wrote: No appointment has been scheduled. Patient is requesting shingle and flu shot appointment at the same day. Per scheduling protocol, this appointment requires a prior authorization prior to scheduling.  Route to department's PEC pool. >> Sep 15, 2019  1:39 PM Lonia Skinner [place] A wrote: Flu shot has been scheduled.   Needs RX for shingles sent to [facility] on [address]

## 2019-09-16 ENCOUNTER — Other Ambulatory Visit: Payer: Self-pay

## 2019-09-16 ENCOUNTER — Ambulatory Visit (INDEPENDENT_AMBULATORY_CARE_PROVIDER_SITE_OTHER): Payer: Medicare Other

## 2019-09-16 DIAGNOSIS — Z23 Encounter for immunization: Secondary | ICD-10-CM

## 2019-09-30 ENCOUNTER — Encounter (HOSPITAL_COMMUNITY): Payer: Self-pay

## 2019-09-30 ENCOUNTER — Inpatient Hospital Stay (HOSPITAL_COMMUNITY)
Admission: EM | Admit: 2019-09-30 | Discharge: 2019-10-02 | DRG: 390 | Disposition: A | Discharge status: Home / Self Care | Payer: Medicare Other | Attending: General Surgery | Admitting: General Surgery

## 2019-09-30 ENCOUNTER — Emergency Department (HOSPITAL_COMMUNITY): Payer: Medicare Other

## 2019-09-30 ENCOUNTER — Other Ambulatory Visit: Payer: Self-pay

## 2019-09-30 DIAGNOSIS — N183 Chronic kidney disease, stage 3 unspecified: Secondary | ICD-10-CM | POA: Diagnosis present

## 2019-09-30 DIAGNOSIS — K219 Gastro-esophageal reflux disease without esophagitis: Secondary | ICD-10-CM | POA: Diagnosis present

## 2019-09-30 DIAGNOSIS — Z885 Allergy status to narcotic agent status: Secondary | ICD-10-CM | POA: Diagnosis not present

## 2019-09-30 DIAGNOSIS — Z85528 Personal history of other malignant neoplasm of kidney: Secondary | ICD-10-CM | POA: Diagnosis not present

## 2019-09-30 DIAGNOSIS — Z91040 Latex allergy status: Secondary | ICD-10-CM

## 2019-09-30 DIAGNOSIS — I129 Hypertensive chronic kidney disease with stage 1 through stage 4 chronic kidney disease, or unspecified chronic kidney disease: Secondary | ICD-10-CM | POA: Diagnosis present

## 2019-09-30 DIAGNOSIS — M1990 Unspecified osteoarthritis, unspecified site: Secondary | ICD-10-CM | POA: Diagnosis present

## 2019-09-30 DIAGNOSIS — Z8249 Family history of ischemic heart disease and other diseases of the circulatory system: Secondary | ICD-10-CM | POA: Diagnosis not present

## 2019-09-30 DIAGNOSIS — Z905 Acquired absence of kidney: Secondary | ICD-10-CM

## 2019-09-30 DIAGNOSIS — E559 Vitamin D deficiency, unspecified: Secondary | ICD-10-CM | POA: Diagnosis present

## 2019-09-30 DIAGNOSIS — E785 Hyperlipidemia, unspecified: Secondary | ICD-10-CM | POA: Diagnosis present

## 2019-09-30 DIAGNOSIS — Z20828 Contact with and (suspected) exposure to other viral communicable diseases: Secondary | ICD-10-CM | POA: Diagnosis present

## 2019-09-30 DIAGNOSIS — Z86010 Personal history of colonic polyps: Secondary | ICD-10-CM

## 2019-09-30 DIAGNOSIS — K589 Irritable bowel syndrome without diarrhea: Secondary | ICD-10-CM | POA: Diagnosis present

## 2019-09-30 DIAGNOSIS — Z888 Allergy status to other drugs, medicaments and biological substances status: Secondary | ICD-10-CM | POA: Diagnosis not present

## 2019-09-30 DIAGNOSIS — Z79899 Other long term (current) drug therapy: Secondary | ICD-10-CM

## 2019-09-30 DIAGNOSIS — K56609 Unspecified intestinal obstruction, unspecified as to partial versus complete obstruction: Principal | ICD-10-CM | POA: Diagnosis present

## 2019-09-30 LAB — LIPASE, BLOOD: Lipase: 24 U/L (ref 11–51)

## 2019-09-30 LAB — CBC WITH DIFFERENTIAL/PLATELET
Abs Immature Granulocytes: 0.04 10*3/uL (ref 0.00–0.07)
Basophils Absolute: 0.1 10*3/uL (ref 0.0–0.1)
Basophils Relative: 1 %
Eosinophils Absolute: 0 10*3/uL (ref 0.0–0.5)
Eosinophils Relative: 0 %
HCT: 43.8 % (ref 36.0–46.0)
Hemoglobin: 15 g/dL (ref 12.0–15.0)
Immature Granulocytes: 0 %
Lymphocytes Relative: 12 %
Lymphs Abs: 1.3 10*3/uL (ref 0.7–4.0)
MCH: 31.1 pg (ref 26.0–34.0)
MCHC: 34.2 g/dL (ref 30.0–36.0)
MCV: 90.9 fL (ref 80.0–100.0)
Monocytes Absolute: 0.8 10*3/uL (ref 0.1–1.0)
Monocytes Relative: 7 %
Neutro Abs: 8.4 10*3/uL — ABNORMAL HIGH (ref 1.7–7.7)
Neutrophils Relative %: 80 %
Platelets: 204 10*3/uL (ref 150–400)
RBC: 4.82 MIL/uL (ref 3.87–5.11)
RDW: 12.6 % (ref 11.5–15.5)
WBC: 10.5 10*3/uL (ref 4.0–10.5)
nRBC: 0 % (ref 0.0–0.2)

## 2019-09-30 LAB — COMPREHENSIVE METABOLIC PANEL
ALT: 18 U/L (ref 0–44)
AST: 22 U/L (ref 15–41)
Albumin: 4.6 g/dL (ref 3.5–5.0)
Alkaline Phosphatase: 69 U/L (ref 38–126)
Anion gap: 14 (ref 5–15)
BUN: 22 mg/dL (ref 8–23)
CO2: 30 mmol/L (ref 22–32)
Calcium: 10.9 mg/dL — ABNORMAL HIGH (ref 8.9–10.3)
Chloride: 94 mmol/L — ABNORMAL LOW (ref 98–111)
Creatinine, Ser: 1.24 mg/dL — ABNORMAL HIGH (ref 0.44–1.00)
GFR calc Af Amer: 52 mL/min — ABNORMAL LOW (ref >60)
GFR calc non Af Amer: 45 mL/min — ABNORMAL LOW (ref >60)
Glucose, Bld: 167 mg/dL — ABNORMAL HIGH (ref 70–99)
Potassium: 3 mmol/L — ABNORMAL LOW (ref 3.5–5.1)
Sodium: 138 mmol/L (ref 135–145)
Total Bilirubin: 1 mg/dL (ref 0.3–1.2)
Total Protein: 8.3 g/dL — ABNORMAL HIGH (ref 6.5–8.1)

## 2019-09-30 MED ORDER — FENTANYL CITRATE (PF) 100 MCG/2ML IJ SOLN
50.0000 ug | INTRAMUSCULAR | Status: DC | PRN
  Administered 2019-09-30 – 2019-10-01 (×5): 50 ug via INTRAVENOUS
  Filled 2019-09-30 (×5): qty 2

## 2019-09-30 MED ORDER — SODIUM CHLORIDE 0.9 % IV BOLUS
1000.0000 mL | Freq: Once | INTRAVENOUS | Status: AC
  Administered 2019-09-30: 1000 mL via INTRAVENOUS

## 2019-09-30 MED ORDER — FAMOTIDINE IN NACL 20-0.9 MG/50ML-% IV SOLN
20.0000 mg | Freq: Once | INTRAVENOUS | Status: AC
  Administered 2019-09-30: 20 mg via INTRAVENOUS
  Filled 2019-09-30: qty 50

## 2019-09-30 MED ORDER — IOHEXOL 300 MG/ML  SOLN
100.0000 mL | Freq: Once | INTRAMUSCULAR | Status: AC | PRN
  Administered 2019-09-30: 21:00:00 100 mL via INTRAVENOUS

## 2019-09-30 MED ORDER — ONDANSETRON HCL 4 MG/2ML IJ SOLN
4.0000 mg | Freq: Once | INTRAMUSCULAR | Status: AC
  Administered 2019-09-30: 4 mg via INTRAVENOUS
  Filled 2019-09-30: qty 2

## 2019-09-30 MED ORDER — FENTANYL CITRATE (PF) 100 MCG/2ML IJ SOLN
50.0000 ug | Freq: Once | INTRAMUSCULAR | Status: AC
  Administered 2019-09-30: 21:00:00 50 ug via INTRAVENOUS
  Filled 2019-09-30: qty 2

## 2019-09-30 NOTE — ED Triage Notes (Addendum)
Pt BIBA from home. Pt started having abd pain 1 hour ago after drinking milk. LUQ and RUQ pain. Hx of hernia. Denies N/V/D, cough, SHOB, fever.

## 2019-09-30 NOTE — ED Provider Notes (Signed)
[facility] DEPT Provider Note   CSN: [id number] Arrival date & time: 09/30/19  [id number]     History   Chief Complaint Chief Complaint  Patient presents with  . Abdominal Pain    HPI Teresa Collins is a 68 y.o. female.     HPI Patient presents with concern of sudden onset nausea, vomiting, abdominal pain. Onset was 5 hours ago, after drinking milk. Since that time she has had persistent abdominal pain, sharp, severe, in the upper abdomen, nonradiating, not improved with anything. There is associated nausea, anorexia, vomiting. Last bowel movement was 4 days ago.  She does have a history of hernia repair in the distant past. She denies fever, confusion, disorientation, chest pain, dyspnea. Past Medical History:  Diagnosis Date  . Arthritis   . ASCUS favor benign 12/2015   negative high risk HPV. Recommend repeat Pap smear in one year  . ASCUS of cervix with negative high risk HPV 12/2018  . Eczema   . GERD (gastroesophageal reflux disease)   . Hx of colonic polyps   . Hyperlipidemia   . Hypertension   . Incisional hernia   . Numbness and tingling    RT THIGH  . Renal cancer ([place]) 2015   Left  . STD (sexually transmitted disease)    GC    Patient Active Problem List   Diagnosis Date Noted  . Chronic renal disease, stage 3, moderately decreased glomerular filtration rate (GFR) between 30-59 mL/min/1.73 square meter 02/12/2019  . Diuretic-induced hypokalemia 02/12/2019  . 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  . IBS (irritable bowel syndrome) 12/06/2015  . 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  . Visit for screening mammogram 11/29/2015  . Atopic eczema 11/29/2015  . Renal cell carcinoma ([place]) 05/27/2014  . Hyperlipidemia with target LDL less than 130 08/13/2013  . Routine general medical examination at a health care facility 03/31/2013  . OA (osteoarthritis) 01/17/2012  . Vitamin D deficiency 11/01/2010  . GERD  10/12/2008  . Essential hypertension 10/11/2008    Past Surgical History:  Procedure Laterality Date  . abortions    . CARPAL TUNNEL RELEASE Left 2017  . CESAREAN SECTION     1 time  . INSERTION OF MESH N/A 07/21/2014   Procedure: INSERTION OF MESH;  Surgeon: Coralie Keens, MD;  Location: [facility];  Service: General;  Laterality: N/A;  . MYOMECTOMY ABDOMINAL APPROACH    . ROBOTIC ASSITED PARTIAL NEPHRECTOMY Left 07/21/2014   Procedure: ROBOTIC ASSISTED LEFT PARTIAL NEPHRECTOMY;  Surgeon: Ardis Hughs, MD;  Location: [facility];  Service: Urology;  Laterality: Left;  . TUBAL LIGATION    . VENTRAL HERNIA REPAIR N/A 07/21/2014   Procedure: INCISIONAL HERNIA REPAIR WITH MESH ;  Surgeon: Coralie Keens, MD;  Location: [facility];  Service: General;  Laterality: N/A;     OB History    Gravida  3   Para  1   Term      Preterm      AB  2   Living  1     SAB      TAB      Ectopic      Multiple      Live Births               Home Medications    Prior to Admission medications   Medication Sig Start Date End Date Taking? Authorizing Provider  chlorthalidone (HYGROTON) 25 MG tablet Take 1 tablet (25 mg total) by mouth  daily. 07/31/19   Janith Lima, MD  Cholecalciferol (VITAMIN D3) 125 MCG (5000 UT) TABS Take 1 tablet by mouth daily.    [provider]  clobetasol ointment (TEMOVATE) [id number] % Apply 1 application topically 2 (two) times daily. 11/25/18   Janith Lima, MD  cyanocobalamin 500 MCG tablet Take 500 mcg by mouth daily.    [provider]  hydrALAZINE (APRESOLINE) 50 MG tablet Take 1 tablet (50 mg total) by mouth 3 (three) times daily. 07/31/19   Janith Lima, MD  Multiple Vitamin (MULTIVITAMIN) tablet Take 1 tablet by mouth daily.    [provider]  potassium chloride SA (K-DUR) 20 MEQ tablet Take 1 tablet (20 mEq total) by mouth 2 (two) times daily. 05/21/19   Janith Lima, MD    Family History Family History  Problem  Relation Age of Onset  . Hypertension Mother   . Hypertension Sister   . Hypertension Brother   . Colon cancer Neg Hx   . Esophageal cancer Neg Hx   . Rectal cancer Neg Hx   . Stomach cancer Neg Hx   . Breast cancer Neg Hx     Social History Social History   Tobacco Use  . Smoking status: Never Smoker  . Smokeless tobacco: Never Used  Substance Use Topics  . Alcohol use: Yes    Alcohol/week: 0.0 standard drinks    Comment: RARE  . Drug use: Never     Allergies   Amlodipine, Lipitor [atorvastatin], Bystolic [nebivolol hcl], Codeine sulfate, and Latex   Review of Systems Review of Systems  Constitutional:       Per HPI, otherwise negative  HENT:       Per HPI, otherwise negative  Respiratory:       Per HPI, otherwise negative  Cardiovascular:       Per HPI, otherwise negative  Gastrointestinal: Positive for abdominal pain, nausea and vomiting.  Endocrine:       Negative aside from HPI  Genitourinary:       Neg aside from HPI   Musculoskeletal:       Per HPI, otherwise negative  Skin: Negative.   Neurological: Negative for syncope.     Physical Exam Updated Vital Signs BP (!) 171/107   Pulse 65   Temp 98.2 F (36.8 C) (Oral)   Resp (!) 23   SpO2 100%   Physical Exam Vitals signs and nursing note reviewed.  Constitutional:      General: She is not in acute distress.    Appearance: She is well-developed.  HENT:     Head: Normocephalic and atraumatic.  Eyes:     Conjunctiva/sclera: Conjunctivae normal.  Cardiovascular:     Rate and Rhythm: Normal rate and regular rhythm.  Pulmonary:     Effort: Pulmonary effort is normal. No respiratory distress.     Breath sounds: Normal breath sounds. No stridor.  Abdominal:     General: There is no distension.    Skin:    General: Skin is warm and dry.  Neurological:     Mental Status: She is alert and oriented to person, place, and time.     Cranial Nerves: No cranial nerve deficit.      ED  Treatments / Results  Labs (all labs ordered are listed, but only abnormal results are displayed) Labs Reviewed  COMPREHENSIVE METABOLIC PANEL - Abnormal; Notable for the following components:      Result Value   Potassium 3.0 (*)  Chloride 94 (*)    Glucose, Bld 167 (*)    Creatinine, Ser 1.24 (*)    Calcium 10.9 (*)    Total Protein 8.3 (*)    GFR calc non Af Amer 45 (*)    GFR calc Af Amer 52 (*)    All other components within normal limits  CBC WITH DIFFERENTIAL/PLATELET - Abnormal; Notable for the following components:   Neutro Abs 8.4 (*)    All other components within normal limits  SARS CORONAVIRUS 2 (TAT 6-24 HRS)  LIPASE, BLOOD    EKG EKG Interpretation  Date/Time:  Wednesday September 30 2019 19:59:55 EST Ventricular Rate:  70 PR Interval:    QRS Duration: 100 QT Interval:  443 QTC Calculation: 478 R Axis:   79 Text Interpretation: Sinus rhythm Multiform ventricular premature complexes Borderline short PR interval Probable left atrial enlargement Baseline wander Artifact Abnormal ECG Confirmed by Carmin Muskrat [phone]) on 09/30/2019 8:56:40 PM   Radiology Ct Abdomen Pelvis W Contrast  Result Date: 09/30/2019 CLINICAL DATA:  Abdominal pain EXAM: CT ABDOMEN AND PELVIS WITH CONTRAST TECHNIQUE: Multidetector CT imaging of the abdomen and pelvis was performed using the standard protocol following bolus administration of intravenous contrast. CONTRAST:  133mL OMNIPAQUE IOHEXOL 300 MG/ML  SOLN COMPARISON:  CT dated January 05, 2019 FINDINGS: Lower chest: The lung bases are clear. The heart size is normal. Hepatobiliary: The liver is normal. Normal gallbladder.There is no biliary ductal dilation. Pancreas: Normal contours without ductal dilatation. No peripancreatic fluid collection. Spleen: No splenic laceration or hematoma. Adrenals/Urinary Tract: --Adrenal glands: No adrenal hemorrhage. --Right kidney/ureter: No hydronephrosis or perinephric hematoma. --Left kidney/ureter:  Again identified are findings of a partial left oophorectomy. --Urinary bladder: Unremarkable. Stomach/Bowel: --Stomach/Duodenum: The stomach is moderately distended. --Small bowel: There are dilated loops of small bowel in the right lower quadrant with a transition point in the low midline abdomen (axial series 2, image 51). This is likely secondary to adhesions, either enteroenteric or enteroperitoneal in nature. There is no evidence for pneumatosis. There is a trace amount of adjacent free fluid. --Colon: Rectosigmoid diverticulosis without acute inflammation. --Appendix: Normal. Vascular/Lymphatic: Atherosclerotic calcification is present within the non-aneurysmal abdominal aorta, without hemodynamically significant stenosis. --No retroperitoneal lymphadenopathy. --No mesenteric lymphadenopathy. --No pelvic or inguinal lymphadenopathy. Reproductive: Unremarkable Other: No ascites or free air. The abdominal wall is normal. Musculoskeletal. No acute displaced fractures. IMPRESSION: 1. Small-bowel obstruction with transition point in the low midline abdomen, likely secondary to adhesions, either enteroenteric or enteroperitoneal in nature. 2. Rectosigmoid diverticulosis without acute inflammation. 3. Aortic atherosclerosis. Aortic Atherosclerosis (ICD10-I70.0). Electronically Signed   By: Constance Holster M.D.   On: 09/30/2019 21:49    Procedures Procedures (including critical care time)  Medications Ordered in ED Medications  sodium chloride 0.9 % bolus 1,000 mL (1,000 mLs Intravenous Bolus from Bag 09/30/19 2117)  ondansetron (ZOFRAN) injection 4 mg (4 mg Intravenous Given 09/30/19 2115)  fentaNYL (SUBLIMAZE) injection 50 mcg (50 mcg Intravenous Given 09/30/19 2115)  famotidine (PEPCID) IVPB 20 mg premix (20 mg Intravenous New Bag/Given (Non-Interop) 09/30/19 2117)  iohexol (OMNIPAQUE) 300 MG/ML solution 100 mL (100 mLs Intravenous Contrast Given 09/30/19 2127)     Initial Impression / Assessment and  Plan / ED Course  I have reviewed the triage vital signs and the nursing notes.  Pertinent labs & imaging results that were available during my care of the patient were reviewed by me and considered in my medical decision making (see chart for details).    Concern  for infection versus obstruction, CT scan, labs, fluids, analgesia ordered.    10:10 PM After rereviewing the patient's images on CT scan, I discussed them with her, and subsequently discussed with her general surgeon on-call, Dr. Barry Dienes. With concern for a bowel obstruction, the patient will require admission for further monitoring, management.  She has had some pain relief with fentanyl, Zofran, will receive additional doses.  She has not vomited since has been here, but should she get worse she may require nasogastric tube. Patient admitted for further monitoring, management. Covid pending on admission.  Final Clinical Impressions(s) / ED Diagnoses   Final diagnoses:  Small bowel obstruction ([place])      Carmin Muskrat, MD 09/30/19 2211

## 2019-09-30 NOTE — ED Notes (Signed)
ED TO INPATIENT HANDOFF REPORT  ED Nurse Name and Phone #:   Anderson Malta RN [phone]  S Name/Age/Gender Teresa Collins 68 y.o. female Room/Bed: RESA/RESA  Code Status   Code Status: Prior  Home/SNF/Other Home Patient oriented to: self, place, time and situation Is this baseline? Yes   Triage Complete: Triage complete  Chief Complaint abd pain  Triage Note Pt BIBA from home. Pt started having abd pain 1 hour ago after drinking milk. LUQ and RUQ pain. Hx of hernia. Denies N/V/D, cough, SHOB, fever.   Allergies Allergies  Allergen Reactions  . Amlodipine Other (See Comments)    headache  . Lipitor [Atorvastatin] Other (See Comments)    Muscle aches  . Bystolic [Nebivolol Hcl] Swelling  . Codeine Sulfate Hives    REACTION: rash and itching  . Latex Rash    Level of Care/Admitting Diagnosis ED Disposition    ED Disposition Condition Comment   Admit  Hospital Area: [facility] [[id number]]  Level of Care: Med-Surg [16]  Covid Evaluation: Asymptomatic Screening Protocol (No Symptoms)  Diagnosis: SBO (small bowel obstruction) [id number]  Admitting Physician: Shickley, MD [[id number]]  Attending Physician: CCS, MD [[id number]]  Estimated length of stay: past midnight tomorrow  Certification:: I certify this patient will need inpatient services for at least 2 midnights  PT Class (Do Not Modify): Inpatient [101]  PT Acc Code (Do Not Modify): Private [1]       B Medical/Surgery History Past Medical History:  Diagnosis Date  . Arthritis   . ASCUS favor benign 12/2015   negative high risk HPV. Recommend repeat Pap smear in one year  . ASCUS of cervix with negative high risk HPV 12/2018  . Eczema   . GERD (gastroesophageal reflux disease)   . Hx of colonic polyps   . Hyperlipidemia   . Hypertension   . Incisional hernia   . Numbness and tingling    RT THIGH  . Renal cancer ([place]) 2015   Left  . STD (sexually transmitted disease)    GC   Past Surgical  History:  Procedure Laterality Date  . abortions    . CARPAL TUNNEL RELEASE Left 2017  . CESAREAN SECTION     1 time  . INSERTION OF MESH N/A 07/21/2014   Procedure: INSERTION OF MESH;  Surgeon: Coralie Keens, MD;  Location: [facility];  Service: General;  Laterality: N/A;  . MYOMECTOMY ABDOMINAL APPROACH    . ROBOTIC ASSITED PARTIAL NEPHRECTOMY Left 07/21/2014   Procedure: ROBOTIC ASSISTED LEFT PARTIAL NEPHRECTOMY;  Surgeon: Ardis Hughs, MD;  Location: [facility];  Service: Urology;  Laterality: Left;  . TUBAL LIGATION    . VENTRAL HERNIA REPAIR N/A 07/21/2014   Procedure: INCISIONAL HERNIA REPAIR WITH MESH ;  Surgeon: Coralie Keens, MD;  Location: [facility];  Service: General;  Laterality: N/A;     A IV Location/Drains/Wounds Patient Lines/Drains/Airways Status   Active Line/Drains/Airways    Name:   Placement date:   Placement time:   Site:   Days:   Peripheral IV 09/30/19 Right Antecubital   09/30/19    2017    Antecubital   less than 1   Incision (Closed) 07/21/14 Abdomen   07/21/14    1239     1897   Incision - 5 Ports Abdomen 1: Left;Lateral 2: Left;Distal 3: Left;Medial 4: Left;Proximal Left;Umbilicus   [id number]    [id number]     1897          Intake/Output Last  24 hours  Intake/Output Summary (Last 24 hours) at 09/30/2019 2328 Last data filed at 09/30/2019 2226 Gross per 24 hour  Intake 1100 ml  Output -  Net 1100 ml    Labs/Imaging Results for orders placed or performed during the hospital encounter of 09/30/19 (from the past 48 hour(s))  Comprehensive metabolic panel     Status: Abnormal   Collection Time: 09/30/19  8:21 PM  Result Value Ref Range   Sodium 138 135 - 145 mmol/L   Potassium 3.0 (L) 3.5 - 5.1 mmol/L   Chloride 94 (L) 98 - 111 mmol/L   CO2 30 22 - 32 mmol/L   Glucose, Bld 167 (H) 70 - 99 mg/dL   BUN 22 8 - 23 mg/dL   Creatinine, Ser 1.24 (H) 0.44 - 1.00 mg/dL   Calcium 10.9 (H) 8.9 - 10.3 mg/dL   Total Protein 8.3 (H) 6.5 - 8.1 g/dL   Albumin 4.6  3.5 - 5.0 g/dL   AST 22 15 - 41 U/L   ALT 18 0 - 44 U/L   Alkaline Phosphatase 69 38 - 126 U/L   Total Bilirubin 1.0 0.3 - 1.2 mg/dL   GFR calc non Af Amer 45 (L) >60 mL/min   GFR calc Af Amer 52 (L) >60 mL/min   Anion gap 14 5 - 15    Comment: Performed at [facility], [place] [address], [place], [place] [address]  Lipase, blood     Status: None   Collection Time: 09/30/19  8:21 PM  Result Value Ref Range   Lipase 24 11 - 51 U/L    Comment: Performed at [facility], [place] [address] [place], [place] [address]  CBC with Differential     Status: Abnormal   Collection Time: 09/30/19  8:21 PM  Result Value Ref Range   WBC 10.5 4.0 - 10.5 K/uL   RBC 4.82 3.87 - 5.11 MIL/uL   Hemoglobin 15.0 12.0 - 15.0 g/dL   HCT 43.8 36.0 - 46.0 %   MCV 90.9 80.0 - 100.0 fL   MCH 31.1 26.0 - 34.0 pg   MCHC 34.2 30.0 - 36.0 g/dL   RDW 12.6 11.5 - 15.5 %   Platelets 204 150 - 400 K/uL   nRBC 0.0 0.0 - 0.2 %   Neutrophils Relative % 80 %   Neutro Abs 8.4 (H) 1.7 - 7.7 K/uL   Lymphocytes Relative 12 %   Lymphs Abs 1.3 0.7 - 4.0 K/uL   Monocytes Relative 7 %   Monocytes Absolute 0.8 0.1 - 1.0 K/uL   Eosinophils Relative 0 %   Eosinophils Absolute 0.0 0.0 - 0.5 K/uL   Basophils Relative 1 %   Basophils Absolute 0.1 0.0 - 0.1 K/uL   Immature Granulocytes 0 %   Abs Immature Granulocytes 0.04 0.00 - 0.07 K/uL    Comment: Performed at [facility], [place] [address] [place], [place] [address]   Ct Abdomen Pelvis W Contrast  Result Date: 09/30/2019 CLINICAL DATA:  Abdominal pain EXAM: CT ABDOMEN AND PELVIS WITH CONTRAST TECHNIQUE: Multidetector CT imaging of the abdomen and pelvis was performed using the standard protocol following bolus administration of intravenous contrast. CONTRAST:  139mL OMNIPAQUE IOHEXOL 300 MG/ML  SOLN COMPARISON:  CT dated January 05, 2019 FINDINGS: Lower chest: The lung bases are clear. The heart size is normal. Hepatobiliary:  The liver is normal. Normal gallbladder.There is no biliary ductal dilation. Pancreas: Normal contours without ductal dilatation. No peripancreatic  fluid collection. Spleen: No splenic laceration or hematoma. Adrenals/Urinary Tract: --Adrenal glands: No adrenal hemorrhage. --Right kidney/ureter: No hydronephrosis or perinephric hematoma. --Left kidney/ureter: Again identified are findings of a partial left oophorectomy. --Urinary bladder: Unremarkable. Stomach/Bowel: --Stomach/Duodenum: The stomach is moderately distended. --Small bowel: There are dilated loops of small bowel in the right lower quadrant with a transition point in the low midline abdomen (axial series 2, image 51). This is likely secondary to adhesions, either enteroenteric or enteroperitoneal in nature. There is no evidence for pneumatosis. There is a trace amount of adjacent free fluid. --Colon: Rectosigmoid diverticulosis without acute inflammation. --Appendix: Normal. Vascular/Lymphatic: Atherosclerotic calcification is present within the non-aneurysmal abdominal aorta, without hemodynamically significant stenosis. --No retroperitoneal lymphadenopathy. --No mesenteric lymphadenopathy. --No pelvic or inguinal lymphadenopathy. Reproductive: Unremarkable Other: No ascites or free air. The abdominal wall is normal. Musculoskeletal. No acute displaced fractures. IMPRESSION: 1. Small-bowel obstruction with transition point in the low midline abdomen, likely secondary to adhesions, either enteroenteric or enteroperitoneal in nature. 2. Rectosigmoid diverticulosis without acute inflammation. 3. Aortic atherosclerosis. Aortic Atherosclerosis (ICD10-I70.0). Electronically Signed   By: Constance Holster M.D.   On: 09/30/2019 21:49    Pending Labs Unresulted Labs (From admission, onward)    Start     Ordered   09/30/19 2158  SARS CORONAVIRUS 2 (TAT 6-24 HRS) Nasopharyngeal Nasopharyngeal Swab  (Asymptomatic/Tier 3)  Once,   STAT    Question Answer  Comment  Is this test for diagnosis or screening Screening   Symptomatic for COVID-19 as defined by CDC No   Hospitalized for COVID-19 No   Admitted to ICU for COVID-19 No   Previously tested for COVID-19 No   Resident in a congregate (group) care setting No   Employed in healthcare setting No   Pregnant No      09/30/19 2157   Signed and Held  HIV Antibody (routine testing w rflx)  (HIV Antibody (Routine testing w reflex) panel)  Once,   R     Signed and Held   Signed and Held  CBC  (enoxaparin (LOVENOX)    CrCl >/= 30 ml/min)  Once,   R    Comments: Baseline for enoxaparin therapy IF NOT ALREADY DRAWN.  Notify MD if PLT < 100 K.    Signed and Held   Signed and Held  Creatinine, serum  (enoxaparin (LOVENOX)    CrCl >/= 30 ml/min)  Once,   R    Comments: Baseline for enoxaparin therapy IF NOT ALREADY DRAWN.    Signed and Held   Signed and Held  Creatinine, serum  (enoxaparin (LOVENOX)    CrCl >/= 30 ml/min)  Weekly,   R    Comments: while on enoxaparin therapy    Signed and Held   Signed and Held  Basic metabolic panel  Tomorrow morning,   R     Signed and Held   Signed and Held  Magnesium  Tomorrow morning,   R     Signed and Held   Signed and Held  Phosphorus  Tomorrow morning,   R     Signed and Held   Signed and Held  CBC  Tomorrow morning,   R     Signed and Held          Vitals/Pain Today's Vitals   09/30/19 2100 09/30/19 2226 09/30/19 2230 09/30/19 2300  BP: (!) 171/107  (!) 170/75 (!) 148/79  Pulse: 65  86 88  Resp: (!) 23  18 15   Temp:  TempSrc:      SpO2: 100%  99% 98%  PainSc:  10-Worst pain ever      Isolation Precautions No active isolations  Medications Medications  fentaNYL (SUBLIMAZE) injection 50 mcg (50 mcg Intravenous Given 09/30/19 2304)  sodium chloride 0.9 % bolus 1,000 mL (0 mLs Intravenous Stopped 09/30/19 2226)  ondansetron (ZOFRAN) injection 4 mg (4 mg Intravenous Given 09/30/19 2115)  fentaNYL (SUBLIMAZE) injection 50 mcg (50 mcg  Intravenous Given 09/30/19 2115)  famotidine (PEPCID) IVPB 20 mg premix (0 mg Intravenous Stopped 09/30/19 2226)  iohexol (OMNIPAQUE) 300 MG/ML solution 100 mL (100 mLs Intravenous Contrast Given 09/30/19 2127)    Mobility walks Low fall risk     R Recommendations: See Admitting Provider Note  Report given to:  Bishnu 3E

## 2019-09-30 NOTE — ED Notes (Signed)
Patient transported to CT 

## 2019-10-01 ENCOUNTER — Inpatient Hospital Stay (HOSPITAL_COMMUNITY): Payer: Medicare Other

## 2019-10-01 LAB — CBC
HCT: 42.2 % (ref 36.0–46.0)
Hemoglobin: 14.5 g/dL (ref 12.0–15.0)
MCH: 31 pg (ref 26.0–34.0)
MCHC: 34.4 g/dL (ref 30.0–36.0)
MCV: 90.4 fL (ref 80.0–100.0)
Platelets: 192 10*3/uL (ref 150–400)
RBC: 4.67 MIL/uL (ref 3.87–5.11)
RDW: 12.8 % (ref 11.5–15.5)
WBC: 8.3 10*3/uL (ref 4.0–10.5)
nRBC: 0 % (ref 0.0–0.2)

## 2019-10-01 LAB — BASIC METABOLIC PANEL
Anion gap: 11 (ref 5–15)
BUN: 20 mg/dL (ref 8–23)
CO2: 25 mmol/L (ref 22–32)
Calcium: 10 mg/dL (ref 8.9–10.3)
Chloride: 100 mmol/L (ref 98–111)
Creatinine, Ser: 1.15 mg/dL — ABNORMAL HIGH (ref 0.44–1.00)
GFR calc Af Amer: 57 mL/min — ABNORMAL LOW (ref >60)
GFR calc non Af Amer: 49 mL/min — ABNORMAL LOW (ref >60)
Glucose, Bld: 163 mg/dL — ABNORMAL HIGH (ref 70–99)
Potassium: 3.3 mmol/L — ABNORMAL LOW (ref 3.5–5.1)
Sodium: 136 mmol/L (ref 135–145)

## 2019-10-01 LAB — PHOSPHORUS: Phosphorus: 2.6 mg/dL (ref 2.5–4.6)

## 2019-10-01 LAB — MAGNESIUM: Magnesium: 1.8 mg/dL (ref 1.7–2.4)

## 2019-10-01 LAB — SARS CORONAVIRUS 2 (TAT 6-24 HRS): SARS Coronavirus 2: NEGATIVE

## 2019-10-01 LAB — CREATININE, SERUM
Creatinine, Ser: 1.15 mg/dL — ABNORMAL HIGH (ref 0.44–1.00)
GFR calc Af Amer: 57 mL/min — ABNORMAL LOW (ref >60)
GFR calc non Af Amer: 49 mL/min — ABNORMAL LOW (ref >60)

## 2019-10-01 LAB — HIV ANTIBODY (ROUTINE TESTING W REFLEX): HIV Screen 4th Generation wRfx: NONREACTIVE

## 2019-10-01 MED ORDER — DIPHENHYDRAMINE HCL 12.5 MG/5ML PO ELIX: 12.5000 mg | ORAL_SOLUTION | Freq: Four times a day (QID) | ORAL | Status: DC | PRN

## 2019-10-01 MED ORDER — ACETAMINOPHEN 650 MG RE SUPP: 650.0000 mg | Freq: Four times a day (QID) | RECTAL | Status: DC | PRN

## 2019-10-01 MED ORDER — DIATRIZOATE MEGLUMINE & SODIUM 66-10 % PO SOLN
90.0000 mL | Freq: Once | ORAL | Status: AC
  Administered 2019-10-01: 90 mL via NASOGASTRIC
  Filled 2019-10-01: qty 90

## 2019-10-01 MED ORDER — DIPHENHYDRAMINE HCL 50 MG/ML IJ SOLN: 12.5000 mg | Freq: Four times a day (QID) | INTRAMUSCULAR | Status: DC | PRN

## 2019-10-01 MED ORDER — HYDRALAZINE HCL 20 MG/ML IJ SOLN: 10.0000 mg | INTRAMUSCULAR | Status: DC | PRN

## 2019-10-01 MED ORDER — ONDANSETRON 4 MG PO TBDP: 4.0000 mg | ORAL_TABLET | Freq: Four times a day (QID) | ORAL | Status: DC | PRN

## 2019-10-01 MED ORDER — KCL IN DEXTROSE-NACL 20-5-0.45 MEQ/L-%-% IV SOLN
INTRAVENOUS | Status: DC
  Administered 2019-10-01 – 2019-10-02 (×3): via INTRAVENOUS
  Filled 2019-10-01 (×4): qty 1000

## 2019-10-01 MED ORDER — ACETAMINOPHEN 325 MG PO TABS
650.0000 mg | ORAL_TABLET | Freq: Four times a day (QID) | ORAL | Status: DC | PRN
  Administered 2019-10-02: 650 mg via ORAL
  Filled 2019-10-01: qty 2

## 2019-10-01 MED ORDER — ONDANSETRON HCL 4 MG/2ML IJ SOLN: 4.0000 mg | Freq: Four times a day (QID) | INTRAMUSCULAR | Status: DC | PRN

## 2019-10-01 MED ORDER — ENOXAPARIN SODIUM 40 MG/0.4ML ~~LOC~~ SOLN
40.0000 mg | Freq: Every day | SUBCUTANEOUS | Status: DC
  Administered 2019-10-01 – 2019-10-02 (×2): 40 mg via SUBCUTANEOUS
  Filled 2019-10-01 (×2): qty 0.4

## 2019-10-01 MED ORDER — SIMETHICONE 80 MG PO CHEW: 40.0000 mg | CHEWABLE_TABLET | Freq: Four times a day (QID) | ORAL | Status: DC | PRN

## 2019-10-01 NOTE — H&P (Signed)
Teresa Collins is an 68 y.o. female.   Chief Complaint: abd pain HPI: The patient is a 68 year old black female who presents with abd pain that started yesterday evening. She had an episode of vomiting yesterday but none since. Denies flatus or bm. She has had vhr with mesh about 5 years ago. Denies fever or chill. She came to ER where CT shows sbo.  Past Medical History:  Diagnosis Date  . Arthritis   . ASCUS favor benign 12/2015   negative high risk HPV. Recommend repeat Pap smear in one year  . ASCUS of cervix with negative high risk HPV 12/2018  . Eczema   . GERD (gastroesophageal reflux disease)   . Hx of colonic polyps   . Hyperlipidemia   . Hypertension   . Incisional hernia   . Numbness and tingling    RT THIGH  . Renal cancer ([place]) 2015   Left  . STD (sexually transmitted disease)    GC    Past Surgical History:  Procedure Laterality Date  . abortions    . CARPAL TUNNEL RELEASE Left 2017  . CESAREAN SECTION     1 time  . INSERTION OF MESH N/A 07/21/2014   Procedure: INSERTION OF MESH;  Surgeon: Coralie Keens, MD;  Location: [facility];  Service: General;  Laterality: N/A;  . MYOMECTOMY ABDOMINAL APPROACH    . ROBOTIC ASSITED PARTIAL NEPHRECTOMY Left 07/21/2014   Procedure: ROBOTIC ASSISTED LEFT PARTIAL NEPHRECTOMY;  Surgeon: Ardis Hughs, MD;  Location: [facility];  Service: Urology;  Laterality: Left;  . TUBAL LIGATION    . VENTRAL HERNIA REPAIR N/A 07/21/2014   Procedure: INCISIONAL HERNIA REPAIR WITH MESH ;  Surgeon: Coralie Keens, MD;  Location: [facility];  Service: General;  Laterality: N/A;    Family History  Problem Relation Age of Onset  . Hypertension Mother   . Hypertension Sister   . Hypertension Brother   . Colon cancer Neg Hx   . Esophageal cancer Neg Hx   . Rectal cancer Neg Hx   . Stomach cancer Neg Hx   . Breast cancer Neg Hx    Social History:  reports that she has never smoked. She has never used smokeless tobacco. She reports current  alcohol use. She reports that she does not use drugs.  Allergies:  Allergies  Allergen Reactions  . Amlodipine Other (See Comments)    headache  . Lipitor [Atorvastatin] Other (See Comments)    Muscle aches  . Bystolic [Nebivolol Hcl] Swelling  . Codeine Sulfate Hives    REACTION: rash and itching  . Latex Rash    Medications Prior to Admission  Medication Sig Dispense Refill  . chlorthalidone (HYGROTON) 25 MG tablet Take 1 tablet (25 mg total) by mouth daily. 90 tablet 0  . Cholecalciferol (VITAMIN D3) 125 MCG (5000 UT) TABS Take 1 tablet by mouth daily.    . cyanocobalamin 500 MCG tablet Take 500 mcg by mouth daily.    . hydrALAZINE (APRESOLINE) 50 MG tablet Take 1 tablet (50 mg total) by mouth 3 (three) times daily. 270 tablet 1  . Multiple Vitamin (MULTIVITAMIN) tablet Take 1 tablet by mouth daily.    . clobetasol ointment (TEMOVATE) [id number] % Apply 1 application topically 2 (two) times daily. (Patient not taking: Reported on 09/30/2019) 60 g 1  . potassium chloride SA (K-DUR) 20 MEQ tablet Take 1 tablet (20 mEq total) by mouth 2 (two) times daily. (Patient not taking: Reported on 09/30/2019) 180 tablet  1    Results for orders placed or performed during the hospital encounter of 09/30/19 (from the past 48 hour(s))  Comprehensive metabolic panel     Status: Abnormal   Collection Time: 09/30/19  8:21 PM  Result Value Ref Range   Sodium 138 135 - 145 mmol/L   Potassium 3.0 (L) 3.5 - 5.1 mmol/L   Chloride 94 (L) 98 - 111 mmol/L   CO2 30 22 - 32 mmol/L   Glucose, Bld 167 (H) 70 - 99 mg/dL   BUN 22 8 - 23 mg/dL   Creatinine, Ser 1.24 (H) 0.44 - 1.00 mg/dL   Calcium 10.9 (H) 8.9 - 10.3 mg/dL   Total Protein 8.3 (H) 6.5 - 8.1 g/dL   Albumin 4.6 3.5 - 5.0 g/dL   AST 22 15 - 41 U/L   ALT 18 0 - 44 U/L   Alkaline Phosphatase 69 38 - 126 U/L   Total Bilirubin 1.0 0.3 - 1.2 mg/dL   GFR calc non Af Amer 45 (L) >60 mL/min   GFR calc Af Amer 52 (L) >60 mL/min   Anion gap 14 5 - 15     Comment: Performed at [facility], [place] [address], [place], [place] [address]  Lipase, blood     Status: None   Collection Time: 09/30/19  8:21 PM  Result Value Ref Range   Lipase 24 11 - 51 U/L    Comment: Performed at [facility], [place] [address] [place],  [address]  CBC with Differential     Status: Abnormal   Collection Time: 09/30/19  8:21 PM  Result Value Ref Range   WBC 10.5 4.0 - 10.5 K/uL   RBC 4.82 3.87 - 5.11 MIL/uL   Hemoglobin 15.0 12.0 - 15.0 g/dL   HCT 43.8 36.0 - 46.0 %   MCV 90.9 80.0 - 100.0 fL   MCH 31.1 26.0 - 34.0 pg   MCHC 34.2 30.0 - 36.0 g/dL   RDW 12.6 11.5 - 15.5 %   Platelets 204 150 - 400 K/uL   nRBC 0.0 0.0 - 0.2 %   Neutrophils Relative % 80 %   Neutro Abs 8.4 (H) 1.7 - 7.7 K/uL   Lymphocytes Relative 12 %   Lymphs Abs 1.3 0.7 - 4.0 K/uL   Monocytes Relative 7 %   Monocytes Absolute 0.8 0.1 - 1.0 K/uL   Eosinophils Relative 0 %   Eosinophils Absolute 0.0 0.0 - 0.5 K/uL   Basophils Relative 1 %   Basophils Absolute 0.1 0.0 - 0.1 K/uL   Immature Granulocytes 0 %   Abs Immature Granulocytes 0.04 0.00 - 0.07 K/uL    Comment: Performed at [facility], [place] [address] [place], [place] [address]  SARS CORONAVIRUS 2 (TAT 6-24 HRS) Nasopharyngeal Nasopharyngeal Swab     Status: None   Collection Time: 09/30/19 10:32 PM   Specimen: Nasopharyngeal Swab  Result Value Ref Range   SARS Coronavirus 2 NEGATIVE NEGATIVE    Comment: (NOTE) SARS-CoV-2 target nucleic acids are NOT DETECTED. The SARS-CoV-2 RNA is generally detectable in upper and lower respiratory specimens during the acute phase of infection. Negative results do not preclude SARS-CoV-2 infection, do not rule out co-infections with other pathogens, and should not be used as the sole basis for treatment or other patient management decisions. Negative results must be combined with clinical observations, patient history, and  epidemiological information. The expected result is Negative. Fact Sheet for Patients: SugarRoll.be Fact  Sheet for Healthcare Providers: https://www.woods-mathews.com/ This test is not yet approved or cleared by the [place] FDA and  has been authorized for detection and/or diagnosis of SARS-CoV-2 by FDA under an Emergency Use Authorization (EUA). This EUA will remain  in effect (meaning this test can be used) for the duration of the COVID-19 declaration under Section 56 4(b)(1) of the Act, 21 U.S.C. section 360bbb-3(b)(1), unless the authorization is terminated or revoked sooner. Performed at [facility], [place] [address], [place] [address]   CBC     Status: None   Collection Time: 10/01/19 12:37 AM  Result Value Ref Range   WBC 8.3 4.0 - 10.5 K/uL   RBC 4.67 3.87 - 5.11 MIL/uL   Hemoglobin 14.5 12.0 - 15.0 g/dL   HCT 42.2 36.0 - 46.0 %   MCV 90.4 80.0 - 100.0 fL   MCH 31.0 26.0 - 34.0 pg   MCHC 34.4 30.0 - 36.0 g/dL   RDW 12.8 11.5 - 15.5 %   Platelets 192 150 - 400 K/uL   nRBC 0.0 0.0 - 0.2 %    Comment: Performed at [facility], [place] [address], [place], [place] [address]  Creatinine, serum     Status: Abnormal   Collection Time: 10/01/19 12:37 AM  Result Value Ref Range   Creatinine, Ser 1.15 (H) 0.44 - 1.00 mg/dL   GFR calc non Af Amer 49 (L) >60 mL/min   GFR calc Af Amer 57 (L) >60 mL/min    Comment: Performed at [facility], [place] [address], [place], [place] [id number]  Basic metabolic panel     Status: Abnormal   Collection Time: 10/01/19 12:37 AM  Result Value Ref Range   Sodium 136 135 - 145 mmol/L   Potassium 3.3 (L) 3.5 - 5.1 mmol/L   Chloride 100 98 - 111 mmol/L   CO2 25 22 - 32 mmol/L   Glucose, Bld 163 (H) 70 - 99 mg/dL   BUN 20 8 - 23 mg/dL   Creatinine, Ser 1.15 (H) 0.44 - 1.00 mg/dL   Calcium 10.0 8.9 - 10.3 mg/dL   GFR calc non Af Amer 49 (L) >60 mL/min    GFR calc Af Amer 57 (L) >60 mL/min   Anion gap 11 5 - 15    Comment: Performed at [place] [address], [place], [place] [address]  Magnesium     Status: None   Collection Time: 10/01/19 12:37 AM  Result Value Ref Range   Magnesium 1.8 1.7 - 2.4 mg/dL    Comment: Performed at [facility], [place] [address] [place], [place] [address]  Phosphorus     Status: None   Collection Time: 10/01/19 12:37 AM  Result Value Ref Range   Phosphorus 2.6 2.5 - 4.6 mg/dL    Comment: Performed at [facility], [place] [address] [place], [place] [address]   CT Abdomen Pelvis W Contrast  Result Date: 09/30/2019 CLINICAL DATA:  Abdominal pain EXAM: CT ABDOMEN AND PELVIS WITH CONTRAST TECHNIQUE: Multidetector CT imaging of the abdomen and pelvis was performed using the standard protocol following bolus administration of intravenous contrast. CONTRAST:  114mL OMNIPAQUE IOHEXOL 300 MG/ML  SOLN COMPARISON:  CT dated January 05, 2019 FINDINGS: Lower chest: The lung bases are clear. The heart size is normal. Hepatobiliary: The liver is normal. Normal gallbladder.There is no biliary ductal dilation. Pancreas: Normal contours without ductal dilatation. No peripancreatic fluid collection. Spleen:  No splenic laceration or hematoma. Adrenals/Urinary Tract: --Adrenal glands: No adrenal hemorrhage. --Right kidney/ureter: No hydronephrosis or perinephric hematoma. --Left kidney/ureter: Again identified are findings of a partial left oophorectomy. --Urinary bladder: Unremarkable. Stomach/Bowel: --Stomach/Duodenum: The stomach is moderately distended. --Small bowel: There are dilated loops of small bowel in the right lower quadrant with a transition point in the low midline abdomen (axial series 2, image 51). This is likely secondary to adhesions, either enteroenteric or enteroperitoneal in nature. There is no evidence for pneumatosis. There is a trace amount of adjacent free  fluid. --Colon: Rectosigmoid diverticulosis without acute inflammation. --Appendix: Normal. Vascular/Lymphatic: Atherosclerotic calcification is present within the non-aneurysmal abdominal aorta, without hemodynamically significant stenosis. --No retroperitoneal lymphadenopathy. --No mesenteric lymphadenopathy. --No pelvic or inguinal lymphadenopathy. Reproductive: Unremarkable Other: No ascites or free air. The abdominal wall is normal. Musculoskeletal. No acute displaced fractures. IMPRESSION: 1. Small-bowel obstruction with transition point in the low midline abdomen, likely secondary to adhesions, either enteroenteric or enteroperitoneal in nature. 2. Rectosigmoid diverticulosis without acute inflammation. 3. Aortic atherosclerosis. Aortic Atherosclerosis (ICD10-I70.0). Electronically Signed   By: Constance Holster M.D.   On: 09/30/2019 21:49    Review of Systems  Constitutional: Positive for appetite change.  HENT: Negative.   Eyes: Negative.   Respiratory: Negative.   Cardiovascular: Negative.   Gastrointestinal: Positive for abdominal pain. Negative for vomiting.  Endocrine: Negative.   Genitourinary: Negative.   Musculoskeletal: Negative.   Skin: Negative.   Allergic/Immunologic: Negative.   Neurological: Negative.   Hematological: Negative.   Psychiatric/Behavioral: Negative.     Blood pressure (!) 159/76, pulse 73, temperature 98.6 F (37 C), temperature source Oral, resp. rate 18, SpO2 99 %. Physical Exam  Constitutional: She is oriented to person, place, and time. She appears well-developed and well-nourished. No distress.  HENT:  Head: Normocephalic and atraumatic.  Mouth/Throat: No oropharyngeal exudate.  Eyes: Pupils are equal, round, and reactive to light. Conjunctivae and EOM are normal.  Cardiovascular: Normal rate, regular rhythm and normal heart sounds.  Respiratory: Effort normal and breath sounds normal. No stridor. No respiratory distress.  GI: Soft. Bowel sounds  are normal. She exhibits no distension. There is no abdominal tenderness.  Musculoskeletal:        General: No tenderness or edema. Normal range of motion.     Cervical back: Normal range of motion and neck supple.  Neurological: She is alert and oriented to person, place, and time. Coordination normal.  Skin: Skin is warm and dry. No rash noted.  Psychiatric: She has a normal mood and affect. Her behavior is normal. Thought content normal.     Assessment/Plan The patient appears to have an sbo likely secondary to adhesions. Will admit for bowel rest. Given how soft and not distended her abd is there is a reasonable chance this could resolve without surgery. Will start the small bowel protocol and watch closely

## 2019-10-01 NOTE — Progress Notes (Signed)
Pt came from ED with Small bowel obstruction . Pt had order to insert NG tube . Two RN tried to insert NG tube but unsuccessful. Notified to on call Dr Barry Dienes . Received order to give her gastrografin orally and let day shift nurse to try again. At this time pt is resting,no c/o nausea or vomiting. Will continue to monitor.

## 2019-10-02 NOTE — Discharge Instructions (Signed)
Low-Fiber Eating Plan for a week or two then you may resume your normal diet Fiber is found in fruits, vegetables, whole grains, and beans. Eating a diet low in fiber helps to reduce how often you have bowel movements and how much you produce during a bowel movement. A low-fiber eating plan may help your digestive system heal if:  You have certain conditions, such as Crohn's disease or diverticulitis.  You recently had radiation therapy on your pelvis or bowel.  You recently had intestinal surgery.  You have a new surgical opening in your abdomen (colostomy or ileostomy).  Your intestine is narrowed (stricture). Your health care provider will determine how long you need to stay on this diet. Your health care provider may recommend that you work with a diet and nutrition specialist (dietitian). What are tips for following this plan? General guidelines  Follow recommendations from your dietitian about how much fiber you should have each day.  Most people on this eating plan should try to eat less than 10 grams (g) of fiber each day. Your daily fiber goal is _________________ g.  Take vitamin and mineral supplements as told by your health care provider or dietitian. Chewable or liquid forms are best when on this eating plan. Reading food labels  Check food labels for the amount of dietary fiber.  Choose foods that have less than 2 grams of fiber in one serving. Cooking  Use white flour and other allowed grains for baking and cooking.  Cook meat using methods that keep it tender, such as braising or poaching.  Cook eggs until the yolk is completely solid.  Cook with healthy oils, such as olive oil or canola oil. Meal planning   Eat 5-6 small meals throughout the day instead of 3 large meals.  If you are lactose intolerant: ? Choose low-lactose dairy foods. ? Do not eat dairy foods, if told by your dietitian.  Limit fat and oils to less than 8 teaspoons a day.  Eat small  portions of desserts. What foods are allowed? The items listed below may not be a complete list. Talk with your dietitian about what dietary choices are best for you. Grains All bread and crackers made with white flour. Waffles, pancakes, and [place] toast. Bagels. Pretzels. Melba toast, zwieback, and matzoh. Cooked and dried cereals that do not contain whole grains, added fiber, seeds, or dried fruit. CornmealDomenick Gong. Hot and cold cereals made with refined corn, wheat, rice, or oats. Plain pasta and noodles. White rice. Vegetables Well-cooked or canned vegetables without skin, seeds, or stems. Cooked potatoes without skins. Vegetable juice. Fruits Soft-cooked or canned fruits without skin and seeds. Peeled ripe banana. Applesauce. Fruit juice without pulp. Meats and other protein foods Ground meat. Tender cuts of meat or poultry. Eggs. Fish, seafood, and shellfish. Smooth nut butters. Tofu. Dairy All milk products and drinks. Lactose-free milks, including rice, soy, and almond milks. Yogurt without fruit, nuts, chocolate, or granola mix-ins. Sour cream. Cottage cheese. Cheese. Beverages Decaf coffee. Fruit and vegetable juices or smoothies (in small amounts, with no pulp or skins, and with fruits from allowed list). Sports drinks. Herbal tea. Fats and oils Olive oil, canola oil, sunflower oil, flaxseed oil, and grapeseed oil. Mayonnaise. Cream cheese. Margarine. Butter. Sweets and desserts Plain cakes and cookies. Cream pies and pies made with allowed fruits. Pudding. Custard. Fruit gelatin. Sherbet. Popsicles. Ice cream without nuts. Plain hard candy. Honey. Jelly. Molasses. Syrups, including chocolate syrup. Chocolate. Marshmallows. Gumdrops. Seasoning and other foods  Bouillon. Broth. Cream soups made from allowed foods. Strained soup. Casseroles made with allowed foods. Ketchup. Mild mustard. Mild salad dressings. Plain gravies. Vinegar. Spices in moderation. Salt. Sugar. What foods are not  allowed? The items listed below may not be a complete list. Talk with your dietitian about what dietary choices are best for you. Grains Whole wheat and whole grain breads and crackers. Multigrain breads and crackers. Rye bread. Whole grain or multigrain cereals. Cereals with nuts, raisins, or coconut. Bran. Coarse wheat cereals. Granola. High-fiber cereals. Cornmeal or corn bread. Whole grain pasta. Wild or brown rice. Quinoa. Popcorn. Buckwheat. Wheat germ. Vegetables Potato skins. Raw or undercooked vegetables. All beans and bean sprouts. Cooked greens. Corn. Peas. Cabbage. Beets. Broccoli. Brussels sprouts. Cauliflower. Mushrooms. Onions. Peppers. Parsnips. Okra. Sauerkraut. Fruit Raw or dried fruit. Berries. Fruit juice with pulp. Prune juice. Meats and other protein foods Tough, fibrous meats with gristle. Fatty meat. Poultry with skin. Fried meat, Sales executive, or fish. Deli or lunch meats. Sausage, bacon, and hot dogs. Nuts and chunky nut butter. Dried peas, beans, and lentils. Dairy Yogurt with fruit, nuts, chocolate, or granola mix-ins. Beverages Caffeinated coffee and teas. Fats and oils Avocado. Coconut. Sweets and desserts Desserts, cookies, or candies that contain nuts or coconut. Dried fruit. Jams and preserves with seeds. Marmalade. Any dessert made with fruits or grains that are not allowed. Seasoning and other foods Corn tortilla chips. Soups made with vegetables or grains that are not allowed. Relish. Horseradish. Angie Fava. Olives. Summary  Most people on a low-fiber eating plan should eat less than 10 grams of fiber a day. Follow recommendations from your dietitian about how much fiber you should have each day.  Always check food labels to see the dietary fiber content of packaged foods. In general, a low-fiber food will have fewer than 2 grams of fiber per serving.  In general, try to avoid whole grains, raw fruits and vegetables, dried fruit, tough cuts of meat, nuts, and  seeds.  Take a vitamin and mineral supplement as told by your health care provider or dietitian. This information is not intended to replace advice given to you by your health care provider. Make sure you discuss any questions you have with your health care provider. Document Released: 03/30/2002 Document Revised: 01/30/2019 Document Reviewed: 12/11/2016 Elsevier Patient Education  2020 [employer].

## 2019-10-02 NOTE — Discharge Summary (Signed)
    Patient ID: Trynity Ratajczak [id number] 01-05-1951 68 y.o.  Admit date: 09/30/2019 Discharge date: 10/02/2019  Admitting Diagnosis: SBO  Discharge Diagnosis Patient Active Problem List   Diagnosis Date Noted  . SBO (small bowel obstruction) ([place]) 09/30/2019  . Chronic renal disease, stage 3, moderately decreased glomerular filtration rate (GFR) between 30-59 mL/min/1.73 square meter 02/12/2019  . Diuretic-induced hypokalemia 02/12/2019  . 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  . IBS (irritable bowel syndrome) 12/06/2015  . 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  . Visit for screening mammogram 11/29/2015  . Atopic eczema 11/29/2015  . Renal cell carcinoma ([place]) 05/27/2014  . Hyperlipidemia with target LDL less than 130 08/13/2013  . Routine general medical examination at a health care facility 03/31/2013  . OA (osteoarthritis) 01/17/2012  . Vitamin D deficiency 11/01/2010  . GERD 10/12/2008  . Essential hypertension 10/11/2008    Consultants none  Reason for Admission: The patient is a 68 year old black female who presents with abd pain that started yesterday evening. She had an episode of vomiting yesterday but none since. Denies flatus or bm. She has had vhr with mesh about 5 years ago. Denies fever or chill. She came to ER where CT shows sbo.  Procedures none  Hospital Course:  The patient was admitted.  She was unable to get an NGT in place.  The SBO protocol was started anyway with her drinking the contrast as she had no further N/V.  Her 8 hr delay film showed contrast through to her rectum.  She began having multiple BMs and her diet was advanced as tolerated to soft diet.  She was stable for DC home on HD 1.    Physical Exam: Heart: regular Lungs: CTAB Abd: soft, mild discomfort, ND, +BS  Allergies as of 10/02/2019      Reactions   Amlodipine Other (See Comments)   headache   Lipitor [atorvastatin] Other (See Comments)   Muscle aches   Bystolic [nebivolol Hcl] Swelling   Codeine Sulfate Hives   REACTION: rash and itching   Latex Rash      Medication List    TAKE these medications   chlorthalidone 25 MG tablet Commonly known as: HYGROTON Take 1 tablet (25 mg total) by mouth daily.   clobetasol ointment 0.05 % Commonly known as: TEMOVATE Apply 1 application topically 2 (two) times daily.   hydrALAZINE 50 MG tablet Commonly known as: APRESOLINE Take 1 tablet (50 mg total) by mouth 3 (three) times daily.   multivitamin tablet Take 1 tablet by mouth daily.   potassium chloride SA 20 MEQ tablet Commonly known as: KLOR-CON Take 1 tablet (20 mEq total) by mouth 2 (two) times daily.   vitamin B-12 500 MCG tablet Commonly known as: CYANOCOBALAMIN Take 500 mcg by mouth daily.   Vitamin D3 125 MCG (5000 UT) Tabs Take 1 tablet by mouth daily.        Follow-up Information    Janith Lima, MD Follow up.   Specialty: Internal Medicine Why: only as needed for a post hospital follow up Contact information: [address] [place] [address] [phone]           Signed: Saverio Danker, [facility] 10/02/2019, 9:09 AM Please see Amion for pager number during day hours 7:00am-4:30pm

## 2019-10-02 NOTE — Progress Notes (Signed)
Patient discharged to home w/ SO. Given all belongings, instructions. Verbalized understanding of instructions. Escorted to pov via w/c. °

## 2019-10-05 ENCOUNTER — Telehealth: Payer: Self-pay | Admitting: *Deleted

## 2019-10-05 NOTE — Telephone Encounter (Signed)
Spoke with patient to see how she was doing after her recent hospital d/c for SBO on 10/02/19. Patient states she is doing pretty well. She is eating a soft low fiber diet and states she is tolerating it well and does not have any questions or concerns regarding her d/c instructions. Patient explains that she has not passed a solid BM as of yet the consistency has been watery and loose. Her d/c instructions state for her to f/u with PCP only as needed. Patient states that she does not feel she needs to make an hospital f/u appointment at this time. She will make an appointment if she does not have a solid BM soon, if her abdomen becomes firm or swollen, she starts to have abdominal pain, or if she becomes nauseated. She was appreciative for the call.

## 2019-10-20 ENCOUNTER — Encounter: Payer: Self-pay | Admitting: Internal Medicine

## 2019-10-20 ENCOUNTER — Other Ambulatory Visit: Payer: Self-pay

## 2019-10-20 ENCOUNTER — Other Ambulatory Visit (INDEPENDENT_AMBULATORY_CARE_PROVIDER_SITE_OTHER): Payer: Medicare Other

## 2019-10-20 ENCOUNTER — Ambulatory Visit (INDEPENDENT_AMBULATORY_CARE_PROVIDER_SITE_OTHER): Payer: Medicare Other

## 2019-10-20 ENCOUNTER — Ambulatory Visit (INDEPENDENT_AMBULATORY_CARE_PROVIDER_SITE_OTHER): Payer: Medicare Other | Admitting: Internal Medicine

## 2019-10-20 VITALS — BP 132/76 | HR 60 | Temp 98.3°F | Resp 16 | Ht 62.0 in | Wt 170.0 lb

## 2019-10-20 DIAGNOSIS — E876 Hypokalemia: Secondary | ICD-10-CM | POA: Diagnosis not present

## 2019-10-20 DIAGNOSIS — R10815 Periumbilic abdominal tenderness: Secondary | ICD-10-CM

## 2019-10-20 DIAGNOSIS — L280 Lichen simplex chronicus: Secondary | ICD-10-CM | POA: Diagnosis not present

## 2019-10-20 DIAGNOSIS — R739 Hyperglycemia, unspecified: Secondary | ICD-10-CM

## 2019-10-20 DIAGNOSIS — L2084 Intrinsic (allergic) eczema: Secondary | ICD-10-CM

## 2019-10-20 DIAGNOSIS — K5900 Constipation, unspecified: Secondary | ICD-10-CM | POA: Diagnosis not present

## 2019-10-20 LAB — BASIC METABOLIC PANEL
BUN: 18 mg/dL (ref 6–23)
CO2: 32 mEq/L (ref 19–32)
Calcium: 10.3 mg/dL (ref 8.4–10.5)
Chloride: 98 mEq/L (ref 96–112)
Creatinine, Ser: 1.19 mg/dL (ref 0.40–1.20)
GFR: 54.7 mL/min — ABNORMAL LOW (ref >60.00)
Glucose, Bld: 90 mg/dL (ref 70–99)
Potassium: 3.7 mEq/L (ref 3.5–5.1)
Sodium: 137 mEq/L (ref 135–145)

## 2019-10-20 LAB — MAGNESIUM: Magnesium: 1.9 mg/dL (ref 1.5–2.5)

## 2019-10-20 LAB — AMYLASE: Amylase: 85 U/L (ref 27–131)

## 2019-10-20 LAB — HEMOGLOBIN A1C: Hgb A1c MFr Bld: 5.5 % (ref 4.6–6.5)

## 2019-10-20 LAB — LIPASE: Lipase: 18 U/L (ref 11.0–59.0)

## 2019-10-20 MED ORDER — CLOBETASOL PROPIONATE 0.05 % EX OINT: 1.0000 "application " | TOPICAL_OINTMENT | Freq: Two times a day (BID) | CUTANEOUS | 2 refills | Status: DC

## 2019-10-20 NOTE — Progress Notes (Signed)
Subjective:  Patient ID: Teresa Collins, female    DOB: 1951-03-24  Age: 68 y.o. MRN: [id number]  CC: Abdominal Pain  This visit occurred during the SARS-CoV-2 public health emergency.  Safety protocols were in place, including screening questions prior to the visit, additional usage of staff PPE, and extensive cleaning of exam room while observing appropriate contact time as indicated for disinfecting solutions.    HPI [name] presents for hosp f/up -- She was admitted few weeks ago and found to have a small bowel obstruction that was felt to be caused by adhesions.  Since discharge she has improved but continues to complain of mild abdominal discomfort with nausea.  She has had a normal appetite and denies vomiting, diarrhea, or constipation.  Admitting Diagnosis: SBO  Discharge Diagnosis     Patient Active Problem List   Diagnosis Date Noted  . SBO (small bowel obstruction) ([place]) 09/30/2019  . Chronic renal disease, stage 3, moderately decreased glomerular filtration rate (GFR) between 30-59 mL/min/1.73 square meter 02/12/2019  . Diuretic-induced hypokalemia 02/12/2019  . 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  . IBS (irritable bowel syndrome) 12/06/2015  . 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  . Visit for screening mammogram 11/29/2015  . Atopic eczema 11/29/2015  . Renal cell carcinoma ([place]) 05/27/2014  . Hyperlipidemia with target LDL less than 130 08/13/2013  . Routine general medical examination at a health care facility 03/31/2013  . OA (osteoarthritis) 01/17/2012  . Vitamin D deficiency 11/01/2010  . GERD 10/12/2008  . Essential hypertension 10/11/2008     Outpatient Medications Prior to Visit  Medication Sig Dispense Refill  . chlorthalidone (HYGROTON) 25 MG tablet Take 1 tablet (25 mg total) by mouth daily. 90 tablet 0  . Cholecalciferol (VITAMIN D3) 125 MCG (5000 UT) TABS Take 1 tablet by mouth daily.    . cyanocobalamin 500 MCG  tablet Take 500 mcg by mouth daily.    . hydrALAZINE (APRESOLINE) 50 MG tablet Take 1 tablet (50 mg total) by mouth 3 (three) times daily. 270 tablet 1  . Multiple Vitamin (MULTIVITAMIN) tablet Take 1 tablet by mouth daily.    . potassium chloride SA (K-DUR) 20 MEQ tablet Take 1 tablet (20 mEq total) by mouth 2 (two) times daily. 180 tablet 1  . clobetasol ointment (TEMOVATE) [id number] % Apply 1 application topically 2 (two) times daily. 60 g 1   No facility-administered medications prior to visit.    ROS Review of Systems  Constitutional: Negative for diaphoresis, fatigue and unexpected weight change.  HENT: Negative.   Eyes: Negative for visual disturbance.  Respiratory: Negative for cough, chest tightness, shortness of breath and wheezing.   Cardiovascular: Negative for palpitations and leg swelling.  Gastrointestinal: Positive for abdominal pain and nausea. Negative for blood in stool, constipation, diarrhea and vomiting.  Endocrine: Negative.   Genitourinary: Negative.  Negative for difficulty urinating.  Musculoskeletal: Negative.   Skin: Negative.   Neurological: Negative.  Negative for dizziness, weakness and light-headedness.  Hematological: Negative for adenopathy. Does not bruise/bleed easily.  Psychiatric/Behavioral: Negative.     Objective:  BP 132/76 (BP Location: Left Arm, Patient Position: Sitting, Cuff Size: Normal)   Pulse 60   Temp 98.3 F (36.8 C) (Oral)   Resp 16   Ht 5\' 2"  (1.575 m)   Wt 170 lb (77.1 kg)   SpO2 99%   BMI 31.09 kg/m   BP Readings from Last 3 Encounters:  10/20/19 132/76  10/02/19 (!) 142/68  05/19/19 (!) 144/82  Wt Readings from Last 3 Encounters:  10/20/19 170 lb (77.1 kg)  10/02/19 165 lb (74.8 kg)  05/19/19 177 lb (80.3 kg)    Physical Exam Vitals reviewed.  Constitutional:      General: She is not in acute distress.    Appearance: Normal appearance. She is not ill-appearing, toxic-appearing or diaphoretic.  HENT:     Nose:  Nose normal.     Mouth/Throat:     Mouth: Mucous membranes are moist.  Eyes:     General: No scleral icterus.    Conjunctiva/sclera: Conjunctivae normal.  Cardiovascular:     Rate and Rhythm: Normal rate and regular rhythm.     Heart sounds: No murmur.  Pulmonary:     Effort: Pulmonary effort is normal.     Breath sounds: No stridor. No wheezing, rhonchi or rales.  Abdominal:     General: Abdomen is flat. Bowel sounds are decreased. There is no distension.     Palpations: There is no hepatomegaly or mass.     Tenderness: There is abdominal tenderness in the epigastric area and periumbilical area. There is no guarding or rebound.     Hernia: No hernia is present.  Musculoskeletal:        General: Normal range of motion.     Cervical back: Neck supple.     Right lower leg: No edema.     Left lower leg: No edema.  Lymphadenopathy:     Cervical: No cervical adenopathy.  Skin:    General: Skin is warm and dry.  Neurological:     General: No focal deficit present.     Mental Status: She is alert.  Psychiatric:        Mood and Affect: Mood normal.        Behavior: Behavior normal.     Lab Results  Component Value Date   WBC 8.3 10/01/2019   HGB 14.5 10/01/2019   HCT 42.2 10/01/2019   PLT 192 10/01/2019   GLUCOSE 90 10/20/2019   CHOL 202 (H) 05/19/2019   TRIG 120.0 05/19/2019   HDL 65.50 05/19/2019   LDLDIRECT 166.8 03/31/2013   LDLCALC 112 (H) 05/19/2019   ALT 18 09/30/2019   AST 22 09/30/2019   NA 137 10/20/2019   K 3.7 10/20/2019   CL 98 10/20/2019   CREATININE 1.19 10/20/2019   BUN 18 10/20/2019   CO2 32 10/20/2019   TSH 0.45 05/19/2019   HGBA1C 5.5 10/20/2019    CT Abdomen Pelvis W Contrast  Result Date: 09/30/2019 CLINICAL DATA:  Abdominal pain EXAM: CT ABDOMEN AND PELVIS WITH CONTRAST TECHNIQUE: Multidetector CT imaging of the abdomen and pelvis was performed using the standard protocol following bolus administration of intravenous contrast. CONTRAST:   14mL OMNIPAQUE IOHEXOL 300 MG/ML  SOLN COMPARISON:  CT dated January 05, 2019 FINDINGS: Lower chest: The lung bases are clear. The heart size is normal. Hepatobiliary: The liver is normal. Normal gallbladder.There is no biliary ductal dilation. Pancreas: Normal contours without ductal dilatation. No peripancreatic fluid collection. Spleen: No splenic laceration or hematoma. Adrenals/Urinary Tract: --Adrenal glands: No adrenal hemorrhage. --Right kidney/ureter: No hydronephrosis or perinephric hematoma. --Left kidney/ureter: Again identified are findings of a partial left oophorectomy. --Urinary bladder: Unremarkable. Stomach/Bowel: --Stomach/Duodenum: The stomach is moderately distended. --Small bowel: There are dilated loops of small bowel in the right lower quadrant with a transition point in the low midline abdomen (axial series 2, image 51). This is likely secondary to adhesions, either enteroenteric or enteroperitoneal in nature. There  is no evidence for pneumatosis. There is a trace amount of adjacent free fluid. --Colon: Rectosigmoid diverticulosis without acute inflammation. --Appendix: Normal. Vascular/Lymphatic: Atherosclerotic calcification is present within the non-aneurysmal abdominal aorta, without hemodynamically significant stenosis. --No retroperitoneal lymphadenopathy. --No mesenteric lymphadenopathy. --No pelvic or inguinal lymphadenopathy. Reproductive: Unremarkable Other: No ascites or free air. The abdominal wall is normal. Musculoskeletal. No acute displaced fractures. IMPRESSION: 1. Small-bowel obstruction with transition point in the low midline abdomen, likely secondary to adhesions, either enteroenteric or enteroperitoneal in nature. 2. Rectosigmoid diverticulosis without acute inflammation. 3. Aortic atherosclerosis. Aortic Atherosclerosis (ICD10-I70.0). Electronically Signed   By: Constance Holster M.D.   On: 09/30/2019 21:49   DG Abd Portable 1V-Small Bowel Obstruction  Protocol-initial, 8 hr delay  Result Date: 10/01/2019 CLINICAL DATA:  Abdominal pain EXAM: PORTABLE ABDOMEN - 1 VIEW COMPARISON:  CT abdomen 09/30/2019 FINDINGS: There is oral contrast material throughout the small bowel and colon extending into the rectum. There is no bowel dilatation to suggest obstruction. There is no evidence of pneumoperitoneum, portal venous gas or pneumatosis. There are no pathologic calcifications along the expected course of the ureters. The osseous structures are unremarkable. IMPRESSION: Oral contrast material throughout the small bowel and colon extending into the rectum. Electronically Signed   By: Kathreen Devoid   On: 10/01/2019 12:35   DG Abd Acute W/Chest  Result Date: 10/20/2019 CLINICAL DATA:  Constipation, generalized abdominal pain. EXAM: DG ABDOMEN ACUTE W/ 1V CHEST COMPARISON:  October 01, 2019. FINDINGS: There is no evidence of dilated bowel loops or free intraperitoneal air. No radiopaque calculi or other significant radiographic abnormality is seen. Heart size and mediastinal contours are within normal limits. Both lungs are clear. IMPRESSION: No evidence of bowel obstruction or ileus. Mild stool burden is noted. No acute cardiopulmonary disease. Electronically Signed   By: Marijo Conception M.D.   On: 10/20/2019 10:52     Assessment & Plan:   Raheel was seen today for abdominal pain.  Diagnoses and all orders for this visit:  Hypokalemia- Her K+ level is normal now. -     Cancel: Magnesium -     Cancel: Basic metabolic panel -     Basic metabolic panel; Future -     Magnesium; Future  Intrinsic atopic dermatitis -     clobetasol ointment (TEMOVATE) 0.05 %; Apply 1 application topically 2 (two) times daily.  LSC (lichen simplex chronicus) -     clobetasol ointment (TEMOVATE) 0.05 %; Apply 1 application topically 2 (two) times daily.  Hyperglycemia- Her blood sugar is normal now. -     Cancel: Hemoglobin A1c -     Hemoglobin A1c; Future   Periumbilical abdominal tenderness without rebound tenderness- Based on her improving symptoms, normal labs and xray, the SBO has resolved. She will let me know if she develops any new or worsening sx's. -     Cancel: Amylase -     Cancel: Lipase -     DG Abd Acute W/Chest; Future -     Amylase; Future -     Lipase; Future   I am having Teresa Collins maintain her vitamin B-12, multivitamin, Vitamin D3, potassium chloride SA, hydrALAZINE, chlorthalidone, and clobetasol ointment.  Meds ordered this encounter  Medications  . clobetasol ointment (TEMOVATE) 0.05 %    Sig: Apply 1 application topically 2 (two) times daily.    Dispense:  60 g    Refill:  2     Follow-up: Return if symptoms worsen or fail to  improve.  [name], MD

## 2019-10-20 NOTE — Patient Instructions (Signed)
Abdominal Pain, Adult Abdominal pain can be caused by many things. Often, abdominal pain is not serious and it gets better with no treatment or by being treated at home. However, sometimes abdominal pain is serious. Your health care provider will do a medical history and a physical exam to try to determine the cause of your abdominal pain. Follow these instructions at home:  Take over-the-counter and prescription medicines only as told by your health care provider. Do not take a laxative unless told by your health care provider.  Drink enough fluid to keep your urine clear or pale yellow.  Watch your condition for any changes.  Keep all follow-up visits as told by your health care provider. This is important. Contact a health care provider if:  Your abdominal pain changes or gets worse.  You are not hungry or you lose weight without trying.  You are constipated or have diarrhea for more than 2-3 days.  You have pain when you urinate or have a bowel movement.  Your abdominal pain wakes you up at night.  Your pain gets worse with meals, after eating, or with certain foods.  You are throwing up and cannot keep anything down.  You have a fever. Get help right away if:  Your pain does not go away as soon as your health care provider told you to expect.  You cannot stop throwing up.  Your pain is only in areas of the abdomen, such as the right side or the left lower portion of the abdomen.  You have bloody or black stools, or stools that look like tar.  You have severe pain, cramping, or bloating in your abdomen.  You have signs of dehydration, such as: ? Dark urine, very little urine, or no urine. ? Cracked lips. ? Dry mouth. ? Sunken eyes. ? Sleepiness. ? Weakness. This information is not intended to replace advice given to you by your health care provider. Make sure you discuss any questions you have with your health care provider. Document Released: 07/18/2005 Document  Revised: 04/27/2016 Document Reviewed: 03/21/2016 Elsevier Interactive Patient Education  2020 Elsevier Inc.  

## 2019-11-18 ENCOUNTER — Other Ambulatory Visit: Payer: Self-pay | Admitting: Internal Medicine

## 2019-11-18 DIAGNOSIS — I10 Essential (primary) hypertension: Secondary | ICD-10-CM

## 2019-12-06 ENCOUNTER — Ambulatory Visit: Payer: Medicare Other | Attending: Internal Medicine

## 2019-12-06 DIAGNOSIS — Z23 Encounter for immunization: Secondary | ICD-10-CM | POA: Insufficient documentation

## 2019-12-06 NOTE — Progress Notes (Signed)
   Covid-19 Vaccination Clinic  Name:  Teresa Collins    MRN: [id number] DOB: 12/11/1950  12/06/2019  [name] was observed post Covid-19 immunization for 15 minutes without incidence. She was provided with Vaccine Information Sheet and instruction to access the V-Safe system.   [name] was instructed to call 911 with any severe reactions post vaccine: Marland Kitchen Difficulty breathing  . Swelling of your face and throat  . A fast heartbeat  . A bad rash all over your body  . Dizziness and weakness    Immunizations Administered    Name Date Dose VIS Date Route   Pfizer COVID-19 Vaccine 12/06/2019 11:43 AM 0.3 mL 10/02/2019 Intramuscular   Manufacturer: [place]   Lot: [id number]   [place]: [id number]

## 2019-12-29 ENCOUNTER — Ambulatory Visit: Payer: Medicare Other | Attending: Internal Medicine

## 2019-12-29 DIAGNOSIS — Z23 Encounter for immunization: Secondary | ICD-10-CM | POA: Insufficient documentation

## 2019-12-29 NOTE — Progress Notes (Signed)
   Covid-19 Vaccination Clinic  Name:  Teresa Collins    MRN: [id number] DOB: Nov 05, 1950  12/29/2019  [name] was observed post Covid-19 immunization for 15 minutes without incident. She was provided with Vaccine Information Sheet and instruction to access the V-Safe system.   [name] was instructed to call 911 with any severe reactions post vaccine: Marland Kitchen Difficulty breathing  . Swelling of face and throat  . A fast heartbeat  . A bad rash all over body  . Dizziness and weakness   Immunizations Administered    Name Date Dose VIS Date Route   Pfizer COVID-19 Vaccine 12/29/2019  3:07 PM 0.3 mL 10/02/2019 Intramuscular   Manufacturer: [place]   Lot: [id number]   [place]: [id number]

## 2020-01-12 ENCOUNTER — Other Ambulatory Visit: Payer: Self-pay

## 2020-01-13 ENCOUNTER — Ambulatory Visit (INDEPENDENT_AMBULATORY_CARE_PROVIDER_SITE_OTHER): Payer: Medicare Other | Admitting: Obstetrics and Gynecology

## 2020-01-13 ENCOUNTER — Encounter: Payer: Self-pay | Admitting: Obstetrics and Gynecology

## 2020-01-13 VITALS — BP 120/76 | Ht 62.0 in | Wt 171.0 lb

## 2020-01-13 DIAGNOSIS — Z8742 Personal history of other diseases of the female genital tract: Secondary | ICD-10-CM

## 2020-01-13 DIAGNOSIS — Z01419 Encounter for gynecological examination (general) (routine) without abnormal findings: Secondary | ICD-10-CM

## 2020-01-13 LAB — HM PAP SMEAR

## 2020-01-13 NOTE — Addendum Note (Signed)
Addended by: Nelva Nay on: 01/13/2020 09:04 AM   Modules accepted: Orders

## 2020-01-13 NOTE — Progress Notes (Signed)
Teresa Collins Oct 02, 1951 [id number]  SUBJECTIVE:  69 y.o. [id number] female for annual routine gynecologic exam and Pap smear. Notes an occasional shooting pain in vaginal area every 6-7 months, not constant, can last a few minutes. No exacerbating factors. No vaginal bleeding or discharge. Otherwise she has no gynecologic concerns.  Current Outpatient Medications  Medication Sig Dispense Refill  . Cholecalciferol (VITAMIN D3) 125 MCG (5000 UT) TABS Take 1 tablet by mouth daily.    . clobetasol ointment (TEMOVATE) [id number] % Apply 1 application topically 2 (two) times daily. 60 g 2  . cyanocobalamin 500 MCG tablet Take 500 mcg by mouth daily.    . hydrALAZINE (APRESOLINE) 50 MG tablet Take 1 tablet (50 mg total) by mouth 3 (three) times daily. (Patient taking differently: Take 50 mg by mouth 2 (two) times daily. ) 270 tablet 1  . Multiple Vitamin (MULTIVITAMIN) tablet Take 1 tablet by mouth daily.    . potassium chloride SA (K-DUR) 20 MEQ tablet Take 1 tablet (20 mEq total) by mouth 2 (two) times daily. 180 tablet 1  . chlorthalidone (HYGROTON) 25 MG tablet Take 1 tablet by mouth once daily (Patient not taking: Reported on 01/13/2020) 90 tablet 0   No current facility-administered medications for this visit.   Allergies: Amlodipine, Lipitor [atorvastatin], Bystolic [nebivolol hcl], Codeine sulfate, and Latex  No LMP recorded. Patient is postmenopausal.  Past medical history,surgical history, problem list, medications, allergies, family history and social history were all reviewed and documented as reviewed in the EPIC chart.  ROS:  Feeling well. No dyspnea or chest pain on exertion.  No abdominal pain, change in bowel habits, black or bloody stools.  No urinary tract symptoms. GYN ROS: no abnormal bleeding, pelvic pain or discharge, no breast pain or new or enlarging lumps on self exam. No neurological complaints.   OBJECTIVE:  BP 120/76   Ht 5\' 2"  (1.575 m)   Wt 171 lb (77.6 kg)   BMI  31.28 kg/m  The patient appears well, alert, oriented x 3, in no distress. ENT normal.  Neck supple. No cervical or supraclavicular adenopathy or thyromegaly.  Lungs are clear, good air entry, no wheezes, rhonchi or rales. S1 and S2 normal, no murmurs, regular rate and rhythm.  Abdomen soft without tenderness, guarding, mass or organomegaly.  Neurological is normal, no focal findings.  BREAST EXAM: breasts appear normal, no suspicious masses, no skin or nipple changes or axillary nodes  PELVIC EXAM: VULVA: normal appearing vulva with no masses, tenderness or lesions, VAGINA: normal appearing vagina with normal color and discharge, no lesions, CERVIX: normal appearing cervix without discharge or lesions, UTERUS: uterus is normal size, shape, consistency and nontender, ADNEXA: normal adnexa in size, nontender and no masses, RECTAL: normal rectal, no masses, PAP: Pap smear done today, thin-prep method  Chaperone: Caryn Bee present during the examination  ASSESSMENT:  69 y.o. [id number] here for annual gynecologic exam  PLAN:   1. Postmenopausal.  No significant concerns.  No vaginal bleeding. 2.  History of leiomyoma.  Uterus palpates normal on exam.  Twinges of vaginal pain would be from involuting fibroids.  Continue to follow on annual pelvic exam surveillance.  Is to notify us if the pain is getting more severe and/or consistent/frequent. 3. Pap smear 12/2018 was an ASCUS result.  Pap smear is repeated today. 4. Mammogram 03/2019.  Normal breast exam today.  She is reminded to schedule an annual mammogram this year when due. 5. Colonoscopy 2018.  Recommended that she follow  up at the recommended interval.   6. DEXA 2017 was normal.  Next DEXA recommended 2022. 7. Health maintenance.  No labs today as she normally has these completed elsewhere.  Return annually or sooner, prn.  Joseph Pierini MD, FACOG  01/13/20

## 2020-01-15 LAB — PAP IG W/ RFLX HPV ASCU

## 2020-02-19 ENCOUNTER — Other Ambulatory Visit: Payer: Self-pay | Admitting: Internal Medicine

## 2020-02-19 DIAGNOSIS — I10 Essential (primary) hypertension: Secondary | ICD-10-CM

## 2020-03-04 ENCOUNTER — Other Ambulatory Visit: Payer: Self-pay | Admitting: Internal Medicine

## 2020-03-04 DIAGNOSIS — Z1231 Encounter for screening mammogram for malignant neoplasm of breast: Secondary | ICD-10-CM

## 2020-03-25 IMAGING — MG DIGITAL SCREENING BILATERAL MAMMOGRAM WITH TOMO AND CAD
8 series · 8 of 24 positions shown · non-contrast
Comparison: Previous exam(s).

CLINICAL DATA: Screening.

EXAM:
DIGITAL SCREENING BILATERAL MAMMOGRAM WITH TOMO AND CAD

[R MLO synth-2D]
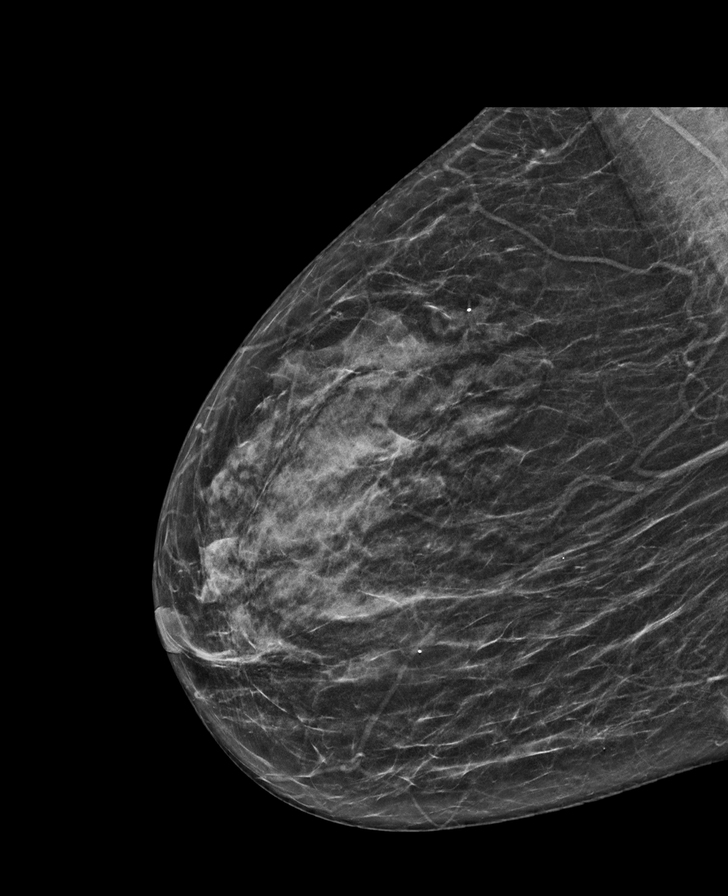

[L CC synth-2D]
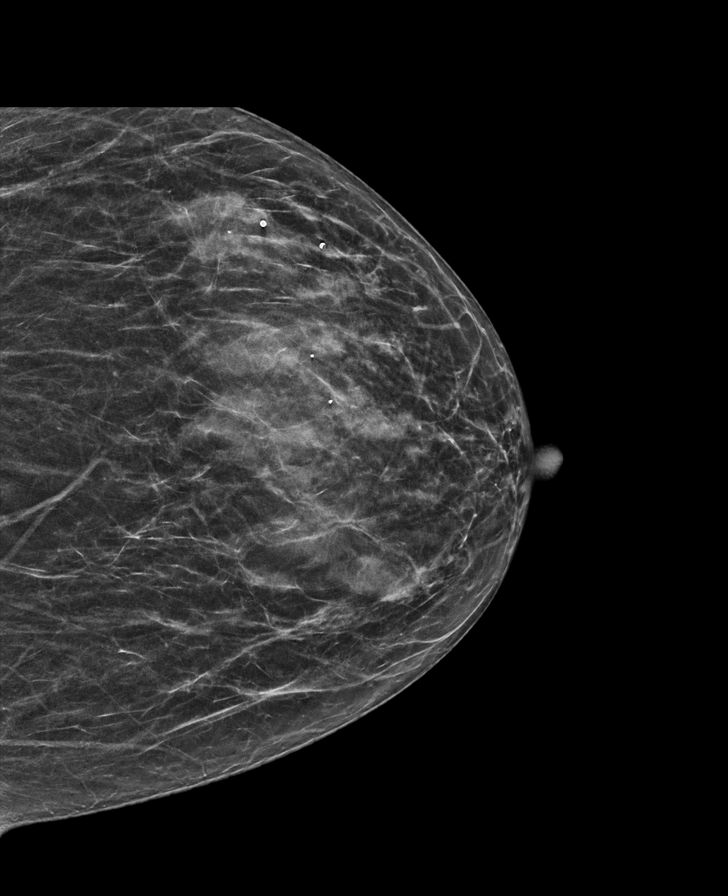

[L MLO synth-2D]
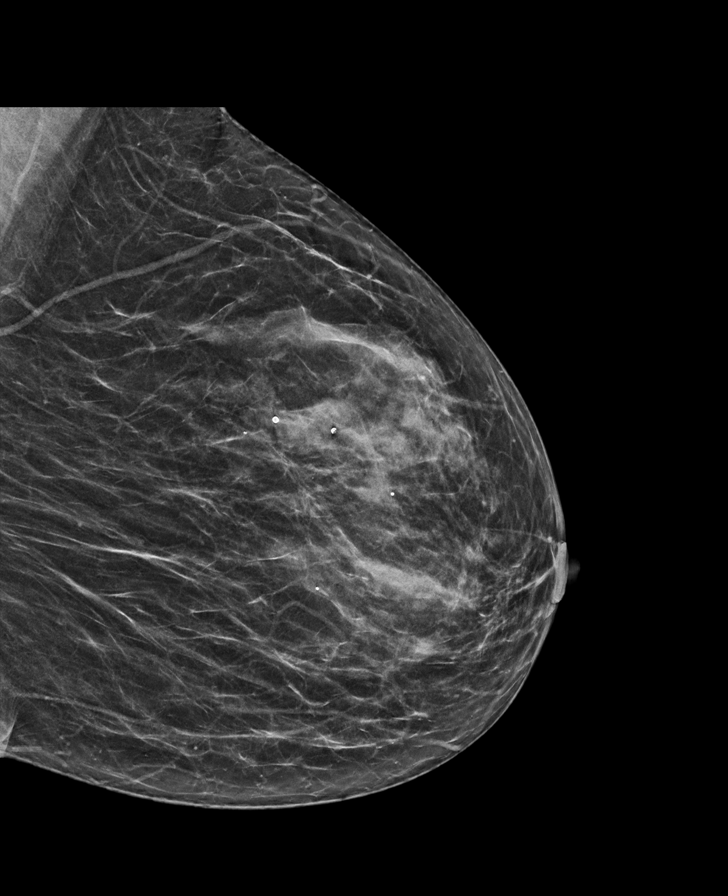

[R CC synth-2D]
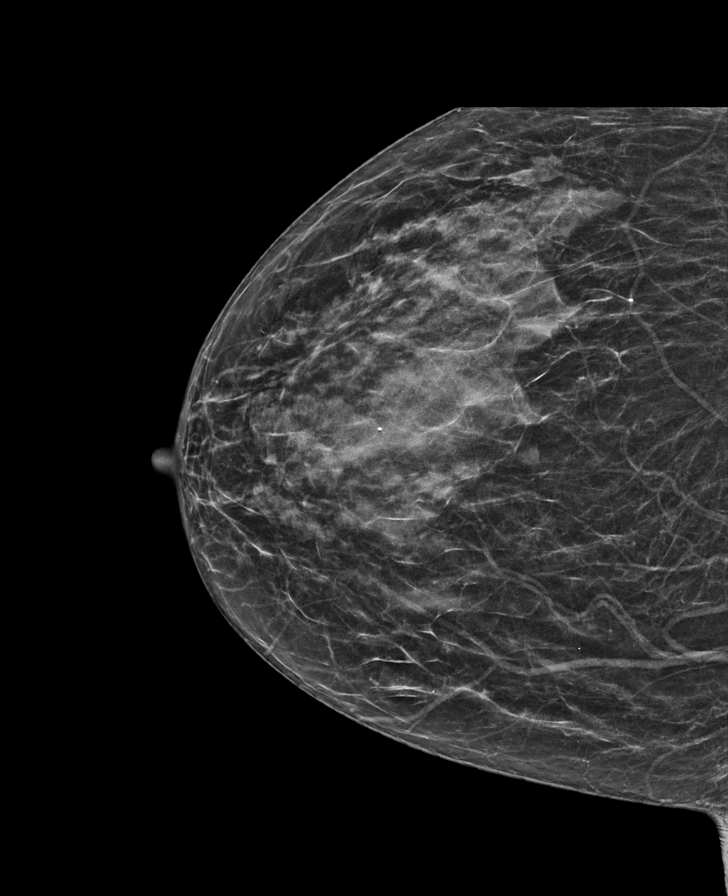

[L CC tomo · tomo slice 29/58.0]
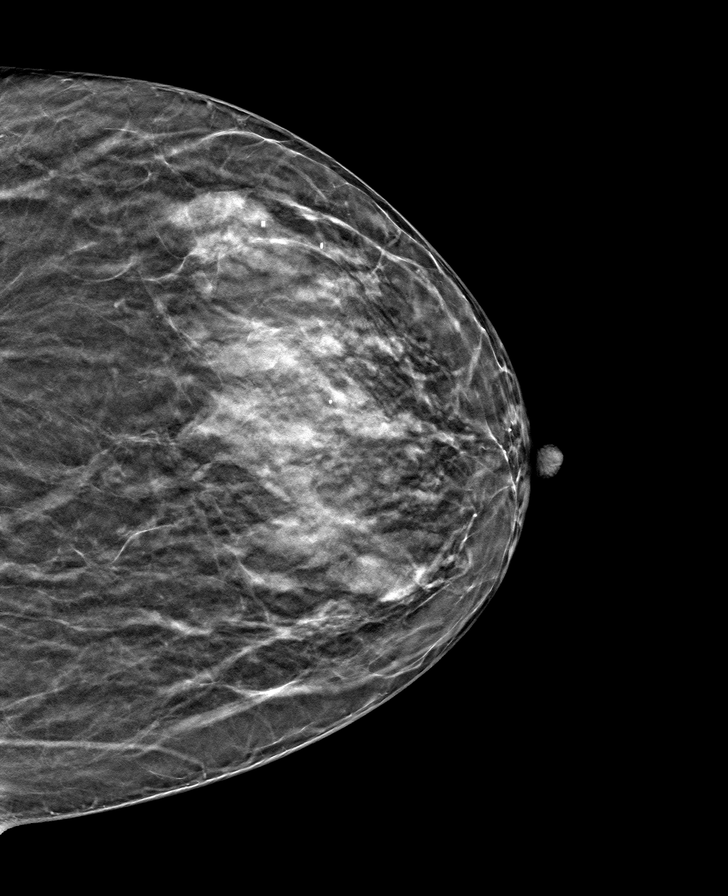

[R MLO tomo · tomo slice 29/56.0]
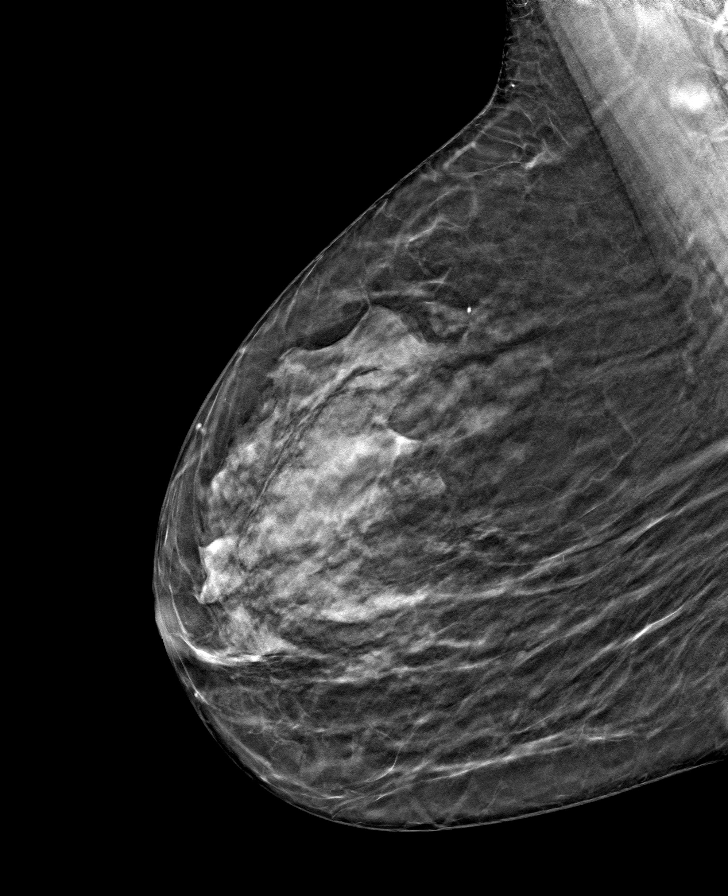

[L MLO tomo · tomo slice 31/61.0]
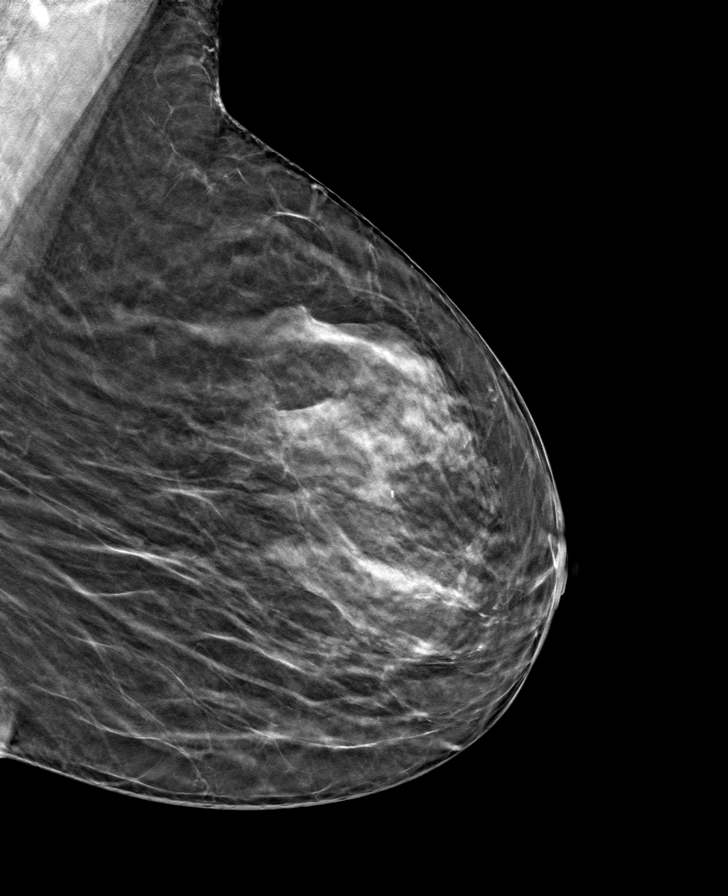

[R CC tomo · tomo slice 28/55.0]
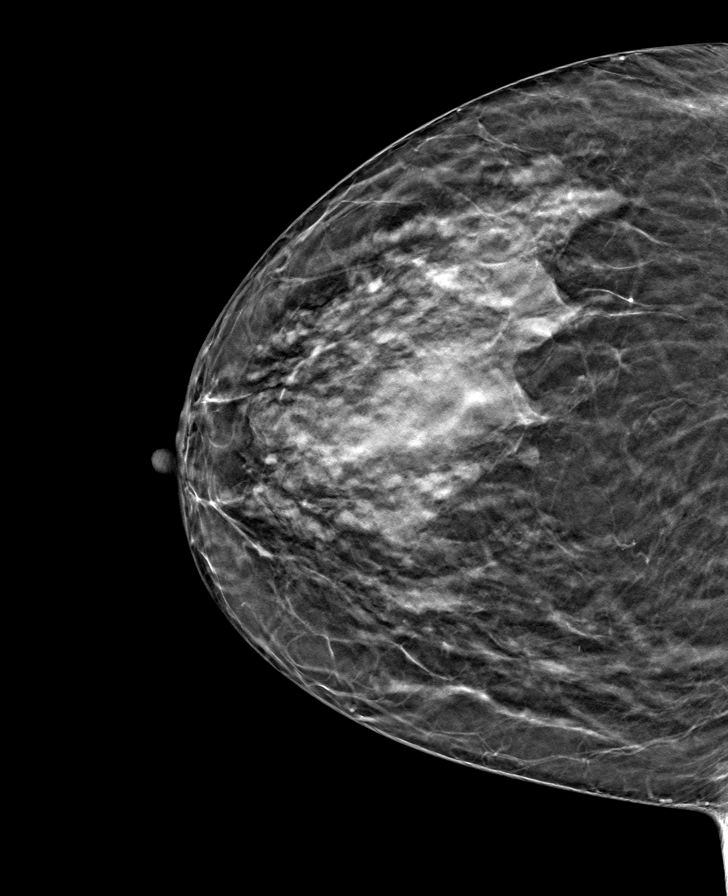

[8 of 24 positions shown; findings below may reference images not displayed]

ACR Breast Density Category c: The breast tissue is heterogeneously
dense, which may obscure small masses.
FINDINGS: There are no findings suspicious for malignancy. Images were
processed with CAD.
IMPRESSION: No mammographic evidence of malignancy. A result letter of this
screening mammogram will be mailed directly to the patient.

RECOMMENDATION:
Screening mammogram in one year. (Code:[id number])

BI-RADS CATEGORY  1: Negative.

## 2020-03-29 ENCOUNTER — Ambulatory Visit
Admission: RE | Admit: 2020-03-29 | Discharge: 2020-03-29 | Disposition: A | Discharge status: Home / Self Care | Payer: Medicare Other | Source: Ambulatory Visit | Attending: Internal Medicine | Admitting: Internal Medicine

## 2020-03-29 ENCOUNTER — Other Ambulatory Visit: Payer: Self-pay

## 2020-03-29 DIAGNOSIS — Z1231 Encounter for screening mammogram for malignant neoplasm of breast: Secondary | ICD-10-CM

## 2020-03-29 LAB — HM MAMMOGRAPHY

## 2020-05-18 ENCOUNTER — Other Ambulatory Visit: Payer: Self-pay | Admitting: Internal Medicine

## 2020-05-18 DIAGNOSIS — I10 Essential (primary) hypertension: Secondary | ICD-10-CM

## 2020-07-25 ENCOUNTER — Other Ambulatory Visit: Payer: Self-pay | Admitting: Nephrology

## 2020-07-25 DIAGNOSIS — N1831 Chronic kidney disease, stage 3a: Secondary | ICD-10-CM

## 2020-07-25 DIAGNOSIS — Z85528 Personal history of other malignant neoplasm of kidney: Secondary | ICD-10-CM

## 2020-07-29 ENCOUNTER — Ambulatory Visit
Admission: RE | Admit: 2020-07-29 | Discharge: 2020-07-29 | Disposition: A | Discharge status: Home / Self Care | Payer: Medicare Other | Source: Ambulatory Visit | Attending: Nephrology | Admitting: Nephrology

## 2020-07-29 DIAGNOSIS — Z85528 Personal history of other malignant neoplasm of kidney: Secondary | ICD-10-CM

## 2020-07-29 DIAGNOSIS — N1831 Chronic kidney disease, stage 3a: Secondary | ICD-10-CM

## 2020-08-06 ENCOUNTER — Ambulatory Visit: Payer: Medicare Other | Attending: Internal Medicine

## 2020-08-06 DIAGNOSIS — Z23 Encounter for immunization: Secondary | ICD-10-CM

## 2020-08-06 NOTE — Progress Notes (Signed)
   Covid-19 Vaccination Clinic  Name:  [name]    MRN: [id number] DOB: 04-11-1951  08/06/2020  Ms. Samad was observed post Covid-19 immunization for 15 minutes without incident. She was provided with Vaccine Information Sheet and instruction to access the V-Safe system.   Ms. Tsang was instructed to call 911 with any severe reactions post vaccine: Marland Kitchen Difficulty breathing  . Swelling of face and throat  . A fast heartbeat  . A bad rash all over body  . Dizziness and weakness

## 2020-08-12 ENCOUNTER — Emergency Department (HOSPITAL_COMMUNITY)
Admission: EM | Admit: 2020-08-12 | Discharge: 2020-08-12 | Disposition: A | Discharge status: Home / Self Care | Payer: Medicare Other | Attending: Emergency Medicine | Admitting: Emergency Medicine

## 2020-08-12 ENCOUNTER — Other Ambulatory Visit: Payer: Self-pay

## 2020-08-12 ENCOUNTER — Encounter (HOSPITAL_COMMUNITY): Payer: Self-pay | Admitting: Emergency Medicine

## 2020-08-12 DIAGNOSIS — I129 Hypertensive chronic kidney disease with stage 1 through stage 4 chronic kidney disease, or unspecified chronic kidney disease: Secondary | ICD-10-CM | POA: Insufficient documentation

## 2020-08-12 DIAGNOSIS — Z91040 Latex allergy status: Secondary | ICD-10-CM | POA: Diagnosis not present

## 2020-08-12 DIAGNOSIS — Z85528 Personal history of other malignant neoplasm of kidney: Secondary | ICD-10-CM | POA: Insufficient documentation

## 2020-08-12 DIAGNOSIS — Z79899 Other long term (current) drug therapy: Secondary | ICD-10-CM | POA: Diagnosis not present

## 2020-08-12 DIAGNOSIS — N183 Chronic kidney disease, stage 3 unspecified: Secondary | ICD-10-CM | POA: Diagnosis not present

## 2020-08-12 DIAGNOSIS — R519 Headache, unspecified: Secondary | ICD-10-CM | POA: Diagnosis present

## 2020-08-12 DIAGNOSIS — I10 Essential (primary) hypertension: Secondary | ICD-10-CM

## 2020-08-12 LAB — CBC WITH DIFFERENTIAL/PLATELET
Abs Immature Granulocytes: 0.01 10*3/uL (ref 0.00–0.07)
Basophils Absolute: 0 10*3/uL (ref 0.0–0.1)
Basophils Relative: 1 %
Eosinophils Absolute: 0.1 10*3/uL (ref 0.0–0.5)
Eosinophils Relative: 2 %
HCT: 39.9 % (ref 36.0–46.0)
Hemoglobin: 13.5 g/dL (ref 12.0–15.0)
Immature Granulocytes: 0 %
Lymphocytes Relative: 36 %
Lymphs Abs: 1.6 10*3/uL (ref 0.7–4.0)
MCH: 31.4 pg (ref 26.0–34.0)
MCHC: 33.8 g/dL (ref 30.0–36.0)
MCV: 92.8 fL (ref 80.0–100.0)
Monocytes Absolute: 0.5 10*3/uL (ref 0.1–1.0)
Monocytes Relative: 12 %
Neutro Abs: 2.2 10*3/uL (ref 1.7–7.7)
Neutrophils Relative %: 49 %
Platelets: 141 10*3/uL — ABNORMAL LOW (ref 150–400)
RBC: 4.3 MIL/uL (ref 3.87–5.11)
RDW: 13.4 % (ref 11.5–15.5)
WBC: 4.3 10*3/uL (ref 4.0–10.5)
nRBC: 0 % (ref 0.0–0.2)

## 2020-08-12 LAB — BASIC METABOLIC PANEL
Anion gap: 10 (ref 5–15)
BUN: 15 mg/dL (ref 8–23)
CO2: 26 mmol/L (ref 22–32)
Calcium: 9.8 mg/dL (ref 8.9–10.3)
Chloride: 103 mmol/L (ref 98–111)
Creatinine, Ser: 1.2 mg/dL — ABNORMAL HIGH (ref 0.44–1.00)
GFR, Estimated: 49 mL/min — ABNORMAL LOW (ref >60)
Glucose, Bld: 101 mg/dL — ABNORMAL HIGH (ref 70–99)
Potassium: 3.6 mmol/L (ref 3.5–5.1)
Sodium: 139 mmol/L (ref 135–145)

## 2020-08-12 MED ORDER — LABETALOL HCL 5 MG/ML IV SOLN
20.0000 mg | Freq: Once | INTRAVENOUS | Status: AC
  Administered 2020-08-12: 20 mg via INTRAVENOUS
  Filled 2020-08-12: qty 4

## 2020-08-12 NOTE — Discharge Instructions (Addendum)
Increase your lisinopril to take 20 mg a day and follow-up with your doctor in 1 to 2 weeks

## 2020-08-12 NOTE — ED Triage Notes (Signed)
Pt BIB EMS from PCP c/o HTN and HA. Pts BP with EMS was 210/98. HA has been intermittent x 6 days. Pt received booster vaccine 6 days ago. Endorses dizziness when standing. A&O and ambulatory.

## 2020-08-12 NOTE — ED Provider Notes (Signed)
[facility] DEPT Provider Note   CSN: [id number] Arrival date & time: 08/12/20  1720     History Chief Complaint  Patient presents with  . Hypertension    Teresa Collins is a 69 y.o. female.  Patient complains of mild headache and poorly controlled blood pressure.  She was recently placed on 10 mg lisinopril a day  The history is provided by the patient and medical records. No language interpreter was used.  Hypertension This is a new problem. The current episode started less than 1 hour ago. The problem occurs constantly. The problem has not changed since onset.Pertinent negatives include no chest pain, no abdominal pain and no headaches. Nothing aggravates the symptoms. Nothing relieves the symptoms. She has tried nothing for the symptoms.       Past Medical History:  Diagnosis Date  . Arthritis   . ASCUS favor benign 12/2015   negative high risk HPV. Recommend repeat Pap smear in one year  . ASCUS of cervix with negative high risk HPV 12/2018  . Eczema   . GERD (gastroesophageal reflux disease)   . Hx of colonic polyps   . Hyperlipidemia   . Hypertension   . Incisional hernia   . Numbness and tingling    RT THIGH  . Renal cancer ([place]) 2015   Left  . STD (sexually transmitted disease)    GC    Patient Active Problem List   Diagnosis Date Noted  . Hyperglycemia 10/20/2019  . Periumbilical abdominal tenderness without rebound tenderness 10/20/2019  . SBO (small bowel obstruction) ([place]) 09/30/2019  . Chronic renal disease, stage 3, moderately decreased glomerular filtration rate (GFR) between 30-59 mL/min/1.73 square meter ([place]) 02/12/2019  . Hypokalemia 02/12/2019  . 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  . IBS (irritable bowel syndrome) 12/06/2015  . 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  . Visit for screening mammogram 11/29/2015  . Atopic eczema 11/29/2015  . Renal cell carcinoma ([place]) 05/27/2014  .  Hyperlipidemia with target LDL less than 130 08/13/2013  . Routine general medical examination at a health care facility 03/31/2013  . OA (osteoarthritis) 01/17/2012  . Vitamin D deficiency 11/01/2010  . GERD 10/12/2008  . Essential hypertension 10/11/2008    Past Surgical History:  Procedure Laterality Date  . abortions    . CARPAL TUNNEL RELEASE Left 2017  . CESAREAN SECTION     1 time  . INSERTION OF MESH N/A 07/21/2014   Procedure: INSERTION OF MESH;  Surgeon: Coralie Keens, MD;  Location: [facility];  Service: General;  Laterality: N/A;  . MYOMECTOMY ABDOMINAL APPROACH    . ROBOTIC ASSITED PARTIAL NEPHRECTOMY Left 07/21/2014   Procedure: ROBOTIC ASSISTED LEFT PARTIAL NEPHRECTOMY;  Surgeon: Ardis Hughs, MD;  Location: [facility];  Service: Urology;  Laterality: Left;  . TUBAL LIGATION    . VENTRAL HERNIA REPAIR N/A 07/21/2014   Procedure: INCISIONAL HERNIA REPAIR WITH MESH ;  Surgeon: Coralie Keens, MD;  Location: [facility];  Service: General;  Laterality: N/A;     OB History    Gravida  3   Para  1   Term      Preterm      AB  2   Living  1     SAB      TAB      Ectopic      Multiple      Live Births              Family  History  Problem Relation Age of Onset  . Hypertension Mother   . Hypertension Sister   . Hypertension Brother   . Colon cancer Neg Hx   . Esophageal cancer Neg Hx   . Rectal cancer Neg Hx   . Stomach cancer Neg Hx   . Breast cancer Neg Hx     Social History   Tobacco Use  . Smoking status: Never Smoker  . Smokeless tobacco: Never Used  Vaping Use  . Vaping Use: Never used  Substance Use Topics  . Alcohol use: Yes    Alcohol/week: 0.0 standard drinks    Comment: RARE  . Drug use: Never    Home Medications Prior to Admission medications   Medication Sig Start Date End Date Taking? Authorizing Provider  chlorthalidone (HYGROTON) 25 MG tablet Take 1 tablet by mouth once daily 02/19/20   Janith Lima, MD    Cholecalciferol (VITAMIN D3) 125 MCG (5000 UT) TABS Take 1 tablet by mouth daily.    [provider]  clobetasol ointment (TEMOVATE) 9.35 % Apply 1 application topically 2 (two) times daily. 10/20/19   Janith Lima, MD  cyanocobalamin 500 MCG tablet Take 500 mcg by mouth daily.    [provider]  hydrALAZINE (APRESOLINE) 50 MG tablet Take 1 tablet (50 mg total) by mouth 3 (three) times daily. Patient taking differently: Take 50 mg by mouth 2 (two) times daily.  07/31/19   Janith Lima, MD  Multiple Vitamin (MULTIVITAMIN) tablet Take 1 tablet by mouth daily.    [provider]  potassium chloride SA (K-DUR) 20 MEQ tablet Take 1 tablet (20 mEq total) by mouth 2 (two) times daily. 05/21/19   Janith Lima, MD    Allergies    Amlodipine, Lipitor [atorvastatin], Bystolic [nebivolol hcl], Codeine sulfate, and Latex  Review of Systems   Review of Systems  Constitutional: Negative for appetite change and fatigue.  HENT: Negative for congestion, ear discharge and sinus pressure.   Eyes: Negative for discharge.  Respiratory: Negative for cough.   Cardiovascular: Negative for chest pain.  Gastrointestinal: Negative for abdominal pain and diarrhea.  Genitourinary: Negative for frequency and hematuria.  Musculoskeletal: Negative for back pain.  Skin: Negative for rash.  Neurological: Negative for seizures and headaches.       Headache  Psychiatric/Behavioral: Negative for hallucinations.    Physical Exam Updated Vital Signs BP (!) 144/77 (BP Location: Left Arm)   Pulse (!) 56   Temp 97.8 F (36.6 C) (Oral)   Resp 16   Ht 5\' 2"  (1.575 m)   Wt 75.8 kg   SpO2 100%   BMI 30.54 kg/m   Physical Exam Vitals and nursing note reviewed.  Constitutional:      Appearance: She is well-developed.  HENT:     Head: Normocephalic.     Nose: Nose normal.  Eyes:     General: No scleral icterus.    Conjunctiva/sclera: Conjunctivae normal.  Neck:     Thyroid: No  thyromegaly.  Cardiovascular:     Rate and Rhythm: Normal rate and regular rhythm.     Heart sounds: No murmur heard.  No friction rub. No gallop.   Pulmonary:     Breath sounds: No stridor. No wheezing or rales.  Chest:     Chest wall: No tenderness.  Abdominal:     General: There is no distension.     Tenderness: There is no abdominal tenderness. There is no rebound.  Musculoskeletal:  General: Normal range of motion.     Cervical back: Neck supple.  Lymphadenopathy:     Cervical: No cervical adenopathy.  Skin:    Findings: No erythema or rash.  Neurological:     Mental Status: She is alert and oriented to person, place, and time.     Motor: No abnormal muscle tone.     Coordination: Coordination normal.  Psychiatric:        Behavior: Behavior normal.     ED Results / Procedures / Treatments   Labs (all labs ordered are listed, but only abnormal results are displayed) Labs Reviewed  CBC WITH DIFFERENTIAL/PLATELET - Abnormal; Notable for the following components:      Result Value   Platelets 141 (*)    All other components within normal limits  BASIC METABOLIC PANEL - Abnormal; Notable for the following components:   Glucose, Bld 101 (*)    Creatinine, Ser 1.20 (*)    GFR, Estimated 49 (*)    All other components within normal limits    EKG None  Radiology No results found.  Procedures Procedures (including critical care time)  Medications Ordered in ED Medications  labetalol (NORMODYNE) injection 20 mg (20 mg Intravenous Given 08/12/20 1823)    ED Course  I have reviewed the triage vital signs and the nursing notes.  Pertinent labs & imaging results that were available during my care of the patient were reviewed by me and considered in my medical decision making (see chart for details).    MDM Rules/Calculators/A&P                          Patient with poorly controlled blood pressure and headache.  Labs unremarkable.  We will increase her  lisinopril to 20 mg a day Final Clinical Impression(s) / ED Diagnoses Final diagnoses:  Hypertension, unspecified type    Rx / DC Orders ED Discharge Orders    None       Milton Ferguson, MD 08/12/20 1948

## 2020-10-24 DIAGNOSIS — M25512 Pain in left shoulder: Secondary | ICD-10-CM | POA: Diagnosis not present

## 2020-11-15 DIAGNOSIS — N1831 Chronic kidney disease, stage 3a: Secondary | ICD-10-CM | POA: Diagnosis not present

## 2020-11-22 DIAGNOSIS — N1831 Chronic kidney disease, stage 3a: Secondary | ICD-10-CM | POA: Diagnosis not present

## 2020-11-22 DIAGNOSIS — Z85528 Personal history of other malignant neoplasm of kidney: Secondary | ICD-10-CM | POA: Diagnosis not present

## 2020-11-22 DIAGNOSIS — I129 Hypertensive chronic kidney disease with stage 1 through stage 4 chronic kidney disease, or unspecified chronic kidney disease: Secondary | ICD-10-CM | POA: Diagnosis not present

## 2020-12-09 ENCOUNTER — Other Ambulatory Visit: Payer: Self-pay | Admitting: Family Medicine

## 2020-12-09 ENCOUNTER — Ambulatory Visit
Admission: RE | Admit: 2020-12-09 | Discharge: 2020-12-09 | Disposition: A | Discharge status: Home / Self Care | Payer: Medicare Other | Source: Ambulatory Visit | Attending: Family Medicine | Admitting: Family Medicine

## 2020-12-09 DIAGNOSIS — R61 Generalized hyperhidrosis: Secondary | ICD-10-CM

## 2021-01-11 DIAGNOSIS — I700 Atherosclerosis of aorta: Secondary | ICD-10-CM | POA: Diagnosis not present

## 2021-01-11 DIAGNOSIS — E7800 Pure hypercholesterolemia, unspecified: Secondary | ICD-10-CM | POA: Diagnosis not present

## 2021-01-11 DIAGNOSIS — R61 Generalized hyperhidrosis: Secondary | ICD-10-CM | POA: Diagnosis not present

## 2021-01-11 DIAGNOSIS — D696 Thrombocytopenia, unspecified: Secondary | ICD-10-CM | POA: Diagnosis not present

## 2021-01-13 ENCOUNTER — Encounter: Payer: Medicare Other | Admitting: Obstetrics and Gynecology

## 2021-01-13 ENCOUNTER — Ambulatory Visit (INDEPENDENT_AMBULATORY_CARE_PROVIDER_SITE_OTHER): Payer: Medicare Other | Admitting: Obstetrics and Gynecology

## 2021-01-13 ENCOUNTER — Encounter: Payer: Self-pay | Admitting: Obstetrics and Gynecology

## 2021-01-13 ENCOUNTER — Other Ambulatory Visit: Payer: Self-pay

## 2021-01-13 VITALS — BP 152/80 | HR 72 | Ht 63.0 in | Wt 168.0 lb

## 2021-01-13 DIAGNOSIS — J302 Other seasonal allergic rhinitis: Secondary | ICD-10-CM | POA: Insufficient documentation

## 2021-01-13 DIAGNOSIS — Z01419 Encounter for gynecological examination (general) (routine) without abnormal findings: Secondary | ICD-10-CM | POA: Diagnosis not present

## 2021-01-13 DIAGNOSIS — I161 Hypertensive emergency: Secondary | ICD-10-CM | POA: Insufficient documentation

## 2021-01-13 DIAGNOSIS — Z905 Acquired absence of kidney: Secondary | ICD-10-CM | POA: Insufficient documentation

## 2021-01-13 DIAGNOSIS — I700 Atherosclerosis of aorta: Secondary | ICD-10-CM | POA: Insufficient documentation

## 2021-01-13 DIAGNOSIS — D696 Thrombocytopenia, unspecified: Secondary | ICD-10-CM | POA: Insufficient documentation

## 2021-01-13 DIAGNOSIS — Z85528 Personal history of other malignant neoplasm of kidney: Secondary | ICD-10-CM | POA: Insufficient documentation

## 2021-01-13 DIAGNOSIS — N941 Unspecified dyspareunia: Secondary | ICD-10-CM

## 2021-01-13 DIAGNOSIS — E7800 Pure hypercholesterolemia, unspecified: Secondary | ICD-10-CM | POA: Insufficient documentation

## 2021-01-13 NOTE — Progress Notes (Signed)
70 y.o. [id number] Single Black or African American Not Hispanic or Latino female here for annual exam.  No vaginal bleeding. Lives with her long term partner. Only occasionally sexually active, some entry dyspareunia. Doesn't use a lubricant. No bowel/bladder c/o.     No LMP recorded. Patient is postmenopausal.          Sexually active: Yes.    The current method of family planning is post menopausal status.    Exercising: Yes.    gym 4 times weekly Smoker:  no  Health Maintenance: Pap:  01/13/20 Neg  01/12/19 ASCUS:Neg HR HPV History of abnormal Pap:  yes MMG:  03/29/20 BIRADS 1 negative/density c BMD:   02/21/16 Normal Colonoscopy: 02/18/17 f/u 5 years TDaP:  2018 Gardasil: n/a   reports that she has never smoked. She has never used smokeless tobacco. She reports previous alcohol use. She reports that she does not use drugs.Rare ETOH. Retired. 1 child, 2 grandchildren (22 and 65).   Past Medical History:  Diagnosis Date  . Arthritis   . ASCUS favor benign 12/2015   negative high risk HPV. Recommend repeat Pap smear in one year  . ASCUS of cervix with negative high risk HPV 12/2018  . Eczema   . GERD (gastroesophageal reflux disease)   . Hx of colonic polyps   . Hyperlipidemia   . Hypertension   . Incisional hernia   . Numbness and tingling    RT THIGH  . Renal cancer ([place]) 2015   Left  . STD (sexually transmitted disease)    GC    Past Surgical History:  Procedure Laterality Date  . abortions    . CARPAL TUNNEL RELEASE Left 2017  . CESAREAN SECTION     1 time  . INSERTION OF MESH N/A 07/21/2014   Procedure: INSERTION OF MESH;  Surgeon: Coralie Keens, MD;  Location: [facility];  Service: General;  Laterality: N/A;  . MYOMECTOMY ABDOMINAL APPROACH    . ROBOTIC ASSITED PARTIAL NEPHRECTOMY Left 07/21/2014   Procedure: ROBOTIC ASSISTED LEFT PARTIAL NEPHRECTOMY;  Surgeon: Ardis Hughs, MD;  Location: [facility];  Service: Urology;  Laterality: Left;  . TUBAL LIGATION    .  VENTRAL HERNIA REPAIR N/A 07/21/2014   Procedure: INCISIONAL HERNIA REPAIR WITH MESH ;  Surgeon: Coralie Keens, MD;  Location: [facility];  Service: General;  Laterality: N/A;    Current Outpatient Medications  Medication Sig Dispense Refill  . aspirin EC 81 MG tablet 1 tablet    . clobetasol ointment (TEMOVATE) 2.95 % Apply 1 application topically 2 (two) times daily. 60 g 2  . cyanocobalamin 500 MCG tablet Take 500 mcg by mouth daily.    . fluticasone (FLONASE) 50 MCG/ACT nasal spray 1 spray in each nostril    . Levocetirizine Dihydrochloride (XYZAL PO) Take by mouth.    Marland Kitchen lisinopril (ZESTRIL) 20 MG tablet Take 20 mg by mouth daily.    . rosuvastatin (CRESTOR) 20 MG tablet Take 20 mg by mouth at bedtime.    . vitamin E 180 MG (400 UNITS) capsule 1 capsule    . chlorthalidone (HYGROTON) 25 MG tablet Take 1 tablet by mouth once daily (Patient not taking: Reported on 01/13/2021) 90 tablet 0   No current facility-administered medications for this visit.    Family History  Problem Relation Age of Onset  . Hypertension Mother   . Hypertension Sister   . Hypertension Brother   . Colon cancer Neg Hx   . Esophageal cancer Neg  Hx   . Rectal cancer Neg Hx   . Stomach cancer Neg Hx   . Breast cancer Neg Hx     Review of Systems  Constitutional: Negative.   HENT: Negative.   Eyes: Negative.   Respiratory: Negative.   Cardiovascular: Negative.   Gastrointestinal: Negative.   Endocrine: Negative.   Genitourinary: Negative.   Musculoskeletal: Negative.   Skin: Negative.   Allergic/Immunologic: Negative.   Neurological: Negative.   Hematological: Negative.   Psychiatric/Behavioral: Negative.     Exam:   BP (!) 152/80   Pulse 72   Ht 5\' 3"  (1.6 m)   Wt 168 lb (76.2 kg)   BMI 29.76 kg/m   Weight change: @WEIGHTCHANGE @ Height:   Height: 5\' 3"  (160 cm)  Ht Readings from Last 3 Encounters:  01/13/21 5\' 3"  (1.6 m)  08/12/20 5\' 2"  (1.575 m)  01/13/20 5\' 2"  (1.575 m)    General  appearance: alert, cooperative and appears stated age Head: Normocephalic, without obvious abnormality, atraumatic Neck: no adenopathy, supple, symmetrical, trachea midline and thyroid normal to inspection and palpation Breasts: normal appearance, no masses or tenderness Abdomen: soft, non-tender; non distended,  no masses,  no organomegaly Extremities: extremities normal, atraumatic, no cyanosis or edema Skin: Skin color, texture, turgor normal. No rashes or lesions Lymph nodes: Cervical, supraclavicular, and axillary nodes normal. No abnormal inguinal nodes palpated Neurologic: Grossly normal   Pelvic: External genitalia:  no lesions              Urethra:  normal appearing urethra with no masses, tenderness or lesions              Bartholins and Skenes: normal                 Vagina:  Mildly atrophic appearing vagina with normal color and discharge, no lesions              Cervix: no lesions               Bimanual Exam:  Uterus:  normal size, contour, position, consistency, mobility, non-tender              Adnexa: no mass, fullness, tenderness               Rectovaginal: Confirms               Anus:  normal sphincter tone, no lesions    1. Encounter for gynecological examination without abnormal finding Discussed breast self exam Discussed calcium and vit D intake Labs with primary Mammogram due in 6/22 Colonoscopy and DEXA are UTD   2. Dyspareunia, female Discussed using lubrication. Call if problematic

## 2021-01-13 NOTE — Patient Instructions (Signed)

## 2021-01-18 ENCOUNTER — Other Ambulatory Visit: Payer: Self-pay | Admitting: Family Medicine

## 2021-01-18 DIAGNOSIS — N1831 Chronic kidney disease, stage 3a: Secondary | ICD-10-CM

## 2021-01-18 DIAGNOSIS — Z85528 Personal history of other malignant neoplasm of kidney: Secondary | ICD-10-CM

## 2021-01-18 DIAGNOSIS — R61 Generalized hyperhidrosis: Secondary | ICD-10-CM

## 2021-01-18 DIAGNOSIS — Z905 Acquired absence of kidney: Secondary | ICD-10-CM

## 2021-01-30 DIAGNOSIS — M25512 Pain in left shoulder: Secondary | ICD-10-CM | POA: Diagnosis not present

## 2021-02-03 ENCOUNTER — Ambulatory Visit
Admission: RE | Admit: 2021-02-03 | Discharge: 2021-02-03 | Disposition: A | Discharge status: Home / Self Care | Payer: Medicare Other | Source: Ambulatory Visit | Attending: Family Medicine | Admitting: Family Medicine

## 2021-02-03 DIAGNOSIS — K5730 Diverticulosis of large intestine without perforation or abscess without bleeding: Secondary | ICD-10-CM | POA: Diagnosis not present

## 2021-02-03 DIAGNOSIS — N1831 Chronic kidney disease, stage 3a: Secondary | ICD-10-CM

## 2021-02-03 DIAGNOSIS — R109 Unspecified abdominal pain: Secondary | ICD-10-CM | POA: Diagnosis not present

## 2021-02-03 DIAGNOSIS — Z85528 Personal history of other malignant neoplasm of kidney: Secondary | ICD-10-CM

## 2021-02-03 DIAGNOSIS — R61 Generalized hyperhidrosis: Secondary | ICD-10-CM

## 2021-02-03 DIAGNOSIS — Z905 Acquired absence of kidney: Secondary | ICD-10-CM

## 2021-02-13 DIAGNOSIS — M25512 Pain in left shoulder: Secondary | ICD-10-CM | POA: Diagnosis not present

## 2021-02-28 ENCOUNTER — Other Ambulatory Visit: Payer: Self-pay | Admitting: Family Medicine

## 2021-02-28 DIAGNOSIS — Z1231 Encounter for screening mammogram for malignant neoplasm of breast: Secondary | ICD-10-CM

## 2021-03-12 DIAGNOSIS — S61012A Laceration without foreign body of left thumb without damage to nail, initial encounter: Secondary | ICD-10-CM | POA: Diagnosis not present

## 2021-03-28 DIAGNOSIS — Z961 Presence of intraocular lens: Secondary | ICD-10-CM | POA: Diagnosis not present

## 2021-03-28 DIAGNOSIS — H11133 Conjunctival pigmentations, bilateral: Secondary | ICD-10-CM | POA: Diagnosis not present

## 2021-04-18 DIAGNOSIS — E7800 Pure hypercholesterolemia, unspecified: Secondary | ICD-10-CM | POA: Diagnosis not present

## 2021-04-18 DIAGNOSIS — I700 Atherosclerosis of aorta: Secondary | ICD-10-CM | POA: Diagnosis not present

## 2021-04-18 DIAGNOSIS — I10 Essential (primary) hypertension: Secondary | ICD-10-CM | POA: Diagnosis not present

## 2021-04-18 DIAGNOSIS — N1831 Chronic kidney disease, stage 3a: Secondary | ICD-10-CM | POA: Diagnosis not present

## 2021-04-25 ENCOUNTER — Other Ambulatory Visit: Payer: Self-pay

## 2021-04-25 ENCOUNTER — Ambulatory Visit
Admission: RE | Admit: 2021-04-25 | Discharge: 2021-04-25 | Disposition: A | Discharge status: Home / Self Care | Payer: Medicare Other | Source: Ambulatory Visit | Attending: Family Medicine | Admitting: Family Medicine

## 2021-04-25 DIAGNOSIS — Z1231 Encounter for screening mammogram for malignant neoplasm of breast: Secondary | ICD-10-CM | POA: Diagnosis not present

## 2021-05-16 DIAGNOSIS — N1831 Chronic kidney disease, stage 3a: Secondary | ICD-10-CM | POA: Diagnosis not present

## 2021-05-22 DIAGNOSIS — N1831 Chronic kidney disease, stage 3a: Secondary | ICD-10-CM | POA: Diagnosis not present

## 2021-05-22 DIAGNOSIS — I129 Hypertensive chronic kidney disease with stage 1 through stage 4 chronic kidney disease, or unspecified chronic kidney disease: Secondary | ICD-10-CM | POA: Diagnosis not present

## 2021-05-22 DIAGNOSIS — Z85528 Personal history of other malignant neoplasm of kidney: Secondary | ICD-10-CM | POA: Diagnosis not present

## 2021-05-22 DIAGNOSIS — R809 Proteinuria, unspecified: Secondary | ICD-10-CM | POA: Diagnosis not present

## 2021-08-11 DIAGNOSIS — R0602 Shortness of breath: Secondary | ICD-10-CM | POA: Diagnosis not present

## 2021-08-11 DIAGNOSIS — R059 Cough, unspecified: Secondary | ICD-10-CM | POA: Diagnosis not present

## 2021-08-11 DIAGNOSIS — Z03818 Encounter for observation for suspected exposure to other biological agents ruled out: Secondary | ICD-10-CM | POA: Diagnosis not present

## 2021-08-11 DIAGNOSIS — J069 Acute upper respiratory infection, unspecified: Secondary | ICD-10-CM | POA: Diagnosis not present

## 2021-08-17 DIAGNOSIS — E7800 Pure hypercholesterolemia, unspecified: Secondary | ICD-10-CM | POA: Diagnosis not present

## 2021-08-17 DIAGNOSIS — Z86010 Personal history of colonic polyps: Secondary | ICD-10-CM | POA: Diagnosis not present

## 2021-08-17 DIAGNOSIS — J019 Acute sinusitis, unspecified: Secondary | ICD-10-CM | POA: Diagnosis not present

## 2021-08-17 DIAGNOSIS — I700 Atherosclerosis of aorta: Secondary | ICD-10-CM | POA: Diagnosis not present

## 2021-08-17 DIAGNOSIS — Z780 Asymptomatic menopausal state: Secondary | ICD-10-CM | POA: Diagnosis not present

## 2021-08-17 DIAGNOSIS — L304 Erythema intertrigo: Secondary | ICD-10-CM | POA: Diagnosis not present

## 2021-08-17 DIAGNOSIS — Z0000 Encounter for general adult medical examination without abnormal findings: Secondary | ICD-10-CM | POA: Diagnosis not present

## 2021-08-17 DIAGNOSIS — I10 Essential (primary) hypertension: Secondary | ICD-10-CM | POA: Diagnosis not present

## 2021-08-17 DIAGNOSIS — D696 Thrombocytopenia, unspecified: Secondary | ICD-10-CM | POA: Diagnosis not present

## 2021-08-17 DIAGNOSIS — Z1211 Encounter for screening for malignant neoplasm of colon: Secondary | ICD-10-CM | POA: Diagnosis not present

## 2021-08-25 ENCOUNTER — Other Ambulatory Visit: Payer: Self-pay | Admitting: Family Medicine

## 2021-08-25 DIAGNOSIS — Z780 Asymptomatic menopausal state: Secondary | ICD-10-CM

## 2021-08-30 DIAGNOSIS — Z23 Encounter for immunization: Secondary | ICD-10-CM | POA: Diagnosis not present

## 2021-09-05 ENCOUNTER — Other Ambulatory Visit: Payer: Self-pay | Admitting: Obstetrics and Gynecology

## 2021-09-05 DIAGNOSIS — Z780 Asymptomatic menopausal state: Secondary | ICD-10-CM

## 2021-09-06 ENCOUNTER — Other Ambulatory Visit: Payer: Self-pay

## 2021-09-06 ENCOUNTER — Ambulatory Visit
Admission: RE | Admit: 2021-09-06 | Discharge: 2021-09-06 | Disposition: A | Discharge status: Home / Self Care | Payer: Medicare Other | Source: Ambulatory Visit | Attending: Family Medicine | Admitting: Family Medicine

## 2021-09-06 DIAGNOSIS — Z780 Asymptomatic menopausal state: Secondary | ICD-10-CM | POA: Diagnosis not present

## 2021-09-06 DIAGNOSIS — M85852 Other specified disorders of bone density and structure, left thigh: Secondary | ICD-10-CM | POA: Diagnosis not present

## 2021-09-12 ENCOUNTER — Emergency Department (HOSPITAL_COMMUNITY): Payer: Medicare Other

## 2021-09-12 ENCOUNTER — Other Ambulatory Visit: Payer: Self-pay

## 2021-09-12 ENCOUNTER — Emergency Department (HOSPITAL_COMMUNITY)
Admission: EM | Admit: 2021-09-12 | Discharge: 2021-09-12 | Disposition: A | Discharge status: Home / Self Care | Payer: Medicare Other | Attending: Emergency Medicine | Admitting: Emergency Medicine

## 2021-09-12 ENCOUNTER — Encounter (HOSPITAL_COMMUNITY): Payer: Self-pay | Admitting: Radiology

## 2021-09-12 DIAGNOSIS — Z743 Need for continuous supervision: Secondary | ICD-10-CM | POA: Diagnosis not present

## 2021-09-12 DIAGNOSIS — M25512 Pain in left shoulder: Secondary | ICD-10-CM

## 2021-09-12 DIAGNOSIS — Z91040 Latex allergy status: Secondary | ICD-10-CM | POA: Insufficient documentation

## 2021-09-12 DIAGNOSIS — Y92410 Unspecified street and highway as the place of occurrence of the external cause: Secondary | ICD-10-CM | POA: Insufficient documentation

## 2021-09-12 DIAGNOSIS — Z79899 Other long term (current) drug therapy: Secondary | ICD-10-CM | POA: Diagnosis not present

## 2021-09-12 DIAGNOSIS — I129 Hypertensive chronic kidney disease with stage 1 through stage 4 chronic kidney disease, or unspecified chronic kidney disease: Secondary | ICD-10-CM | POA: Diagnosis not present

## 2021-09-12 DIAGNOSIS — M25522 Pain in left elbow: Secondary | ICD-10-CM

## 2021-09-12 DIAGNOSIS — Z85528 Personal history of other malignant neoplasm of kidney: Secondary | ICD-10-CM | POA: Diagnosis not present

## 2021-09-12 DIAGNOSIS — R202 Paresthesia of skin: Secondary | ICD-10-CM | POA: Insufficient documentation

## 2021-09-12 DIAGNOSIS — S199XXA Unspecified injury of neck, initial encounter: Secondary | ICD-10-CM | POA: Diagnosis not present

## 2021-09-12 DIAGNOSIS — M2578 Osteophyte, vertebrae: Secondary | ICD-10-CM

## 2021-09-12 DIAGNOSIS — M4802 Spinal stenosis, cervical region: Secondary | ICD-10-CM | POA: Diagnosis not present

## 2021-09-12 DIAGNOSIS — S0990XA Unspecified injury of head, initial encounter: Secondary | ICD-10-CM | POA: Diagnosis not present

## 2021-09-12 DIAGNOSIS — Z7982 Long term (current) use of aspirin: Secondary | ICD-10-CM | POA: Diagnosis not present

## 2021-09-12 DIAGNOSIS — N183 Chronic kidney disease, stage 3 unspecified: Secondary | ICD-10-CM | POA: Diagnosis not present

## 2021-09-12 DIAGNOSIS — M546 Pain in thoracic spine: Secondary | ICD-10-CM

## 2021-09-12 DIAGNOSIS — M25519 Pain in unspecified shoulder: Secondary | ICD-10-CM | POA: Diagnosis not present

## 2021-09-12 DIAGNOSIS — R6889 Other general symptoms and signs: Secondary | ICD-10-CM | POA: Diagnosis not present

## 2021-09-12 MED ORDER — DIPHENHYDRAMINE HCL 25 MG PO CAPS
25.0000 mg | ORAL_CAPSULE | Freq: Once | ORAL | Status: AC
  Administered 2021-09-12: 25 mg via ORAL
  Filled 2021-09-12: qty 1

## 2021-09-12 MED ORDER — OXYCODONE-ACETAMINOPHEN 5-325 MG PO TABS
1.0000 | ORAL_TABLET | Freq: Once | ORAL | Status: AC
  Administered 2021-09-12: 1 via ORAL
  Filled 2021-09-12: qty 1

## 2021-09-12 MED ORDER — LIDOCAINE 5 % EX PTCH: 1.0000 | MEDICATED_PATCH | CUTANEOUS | 0 refills | Status: DC
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
## 2021-09-21 ENCOUNTER — Emergency Department (HOSPITAL_COMMUNITY): Payer: Medicare Other

## 2021-09-21 ENCOUNTER — Emergency Department (HOSPITAL_COMMUNITY)
Admission: EM | Admit: 2021-09-21 | Discharge: 2021-09-21 | Disposition: A | Discharge status: Home / Self Care | Payer: Medicare Other | Attending: Emergency Medicine | Admitting: Emergency Medicine

## 2021-09-21 ENCOUNTER — Encounter (HOSPITAL_COMMUNITY): Payer: Self-pay | Admitting: Emergency Medicine

## 2021-09-21 DIAGNOSIS — R1013 Epigastric pain: Secondary | ICD-10-CM | POA: Diagnosis not present

## 2021-09-21 DIAGNOSIS — M79603 Pain in arm, unspecified: Secondary | ICD-10-CM | POA: Diagnosis not present

## 2021-09-21 DIAGNOSIS — M546 Pain in thoracic spine: Secondary | ICD-10-CM | POA: Insufficient documentation

## 2021-09-21 DIAGNOSIS — M549 Dorsalgia, unspecified: Secondary | ICD-10-CM

## 2021-09-21 DIAGNOSIS — M25512 Pain in left shoulder: Secondary | ICD-10-CM | POA: Insufficient documentation

## 2021-09-21 DIAGNOSIS — I160 Hypertensive urgency: Secondary | ICD-10-CM | POA: Diagnosis not present

## 2021-09-21 DIAGNOSIS — R079 Chest pain, unspecified: Secondary | ICD-10-CM | POA: Diagnosis not present

## 2021-09-21 DIAGNOSIS — M25522 Pain in left elbow: Secondary | ICD-10-CM | POA: Diagnosis not present

## 2021-09-21 DIAGNOSIS — Z91040 Latex allergy status: Secondary | ICD-10-CM | POA: Diagnosis not present

## 2021-09-21 DIAGNOSIS — R0789 Other chest pain: Secondary | ICD-10-CM | POA: Diagnosis not present

## 2021-09-21 DIAGNOSIS — N183 Chronic kidney disease, stage 3 unspecified: Secondary | ICD-10-CM | POA: Diagnosis not present

## 2021-09-21 DIAGNOSIS — Z85528 Personal history of other malignant neoplasm of kidney: Secondary | ICD-10-CM | POA: Insufficient documentation

## 2021-09-21 DIAGNOSIS — R6889 Other general symptoms and signs: Secondary | ICD-10-CM | POA: Diagnosis not present

## 2021-09-21 DIAGNOSIS — I129 Hypertensive chronic kidney disease with stage 1 through stage 4 chronic kidney disease, or unspecified chronic kidney disease: Secondary | ICD-10-CM | POA: Insufficient documentation

## 2021-09-21 DIAGNOSIS — Z041 Encounter for examination and observation following transport accident: Secondary | ICD-10-CM | POA: Diagnosis not present

## 2021-09-21 DIAGNOSIS — R109 Unspecified abdominal pain: Secondary | ICD-10-CM | POA: Diagnosis not present

## 2021-09-21 DIAGNOSIS — Z743 Need for continuous supervision: Secondary | ICD-10-CM | POA: Diagnosis not present

## 2021-09-21 DIAGNOSIS — M2578 Osteophyte, vertebrae: Secondary | ICD-10-CM | POA: Diagnosis not present

## 2021-09-21 DIAGNOSIS — Z79899 Other long term (current) drug therapy: Secondary | ICD-10-CM | POA: Diagnosis not present

## 2021-09-21 DIAGNOSIS — I700 Atherosclerosis of aorta: Secondary | ICD-10-CM | POA: Diagnosis not present

## 2021-09-21 LAB — CBC WITH DIFFERENTIAL/PLATELET
Abs Immature Granulocytes: 0.02 10*3/uL (ref 0.00–0.07)
Basophils Absolute: 0.1 10*3/uL (ref 0.0–0.1)
Basophils Relative: 1 %
Eosinophils Absolute: 0.1 10*3/uL (ref 0.0–0.5)
Eosinophils Relative: 1 %
HCT: 46.5 % — ABNORMAL HIGH (ref 36.0–46.0)
Hemoglobin: 15.3 g/dL — ABNORMAL HIGH (ref 12.0–15.0)
Immature Granulocytes: 0 %
Lymphocytes Relative: 32 %
Lymphs Abs: 1.6 10*3/uL (ref 0.7–4.0)
MCH: 29.9 pg (ref 26.0–34.0)
MCHC: 32.9 g/dL (ref 30.0–36.0)
MCV: 91 fL (ref 80.0–100.0)
Monocytes Absolute: 0.5 10*3/uL (ref 0.1–1.0)
Monocytes Relative: 10 %
Neutro Abs: 2.6 10*3/uL (ref 1.7–7.7)
Neutrophils Relative %: 56 %
Platelets: 182 10*3/uL (ref 150–400)
RBC: 5.11 MIL/uL (ref 3.87–5.11)
RDW: 12.9 % (ref 11.5–15.5)
WBC: 4.8 10*3/uL (ref 4.0–10.5)
nRBC: 0 % (ref 0.0–0.2)

## 2021-09-21 LAB — COMPREHENSIVE METABOLIC PANEL
ALT: 17 U/L (ref 0–44)
AST: 18 U/L (ref 15–41)
Albumin: 4.1 g/dL (ref 3.5–5.0)
Alkaline Phosphatase: 67 U/L (ref 38–126)
Anion gap: 9 (ref 5–15)
BUN: 16 mg/dL (ref 8–23)
CO2: 27 mmol/L (ref 22–32)
Calcium: 10.6 mg/dL — ABNORMAL HIGH (ref 8.9–10.3)
Chloride: 102 mmol/L (ref 98–111)
Creatinine, Ser: 1.3 mg/dL — ABNORMAL HIGH (ref 0.44–1.00)
GFR, Estimated: 45 mL/min — ABNORMAL LOW (ref >60)
Glucose, Bld: 108 mg/dL — ABNORMAL HIGH (ref 70–99)
Potassium: 4.2 mmol/L (ref 3.5–5.1)
Sodium: 138 mmol/L (ref 135–145)
Total Bilirubin: 0.8 mg/dL (ref 0.3–1.2)
Total Protein: 7.9 g/dL (ref 6.5–8.1)

## 2021-09-21 LAB — LIPASE, BLOOD: Lipase: 34 U/L (ref 11–51)

## 2021-09-21 MED ORDER — METHOCARBAMOL 500 MG PO TABS: 500.0000 mg | ORAL_TABLET | Freq: Two times a day (BID) | ORAL | 0 refills | Status: DC

## 2021-09-21 MED ORDER — METHYLPREDNISOLONE 4 MG PO TBPK: ORAL_TABLET | ORAL | 0 refills | Status: DC

## 2021-09-21 MED ORDER — FENTANYL CITRATE PF 50 MCG/ML IJ SOSY
50.0000 ug | PREFILLED_SYRINGE | Freq: Once | INTRAMUSCULAR | Status: AC
  Administered 2021-09-21: 50 ug via INTRAVENOUS
  Filled 2021-09-21: qty 1

## 2021-09-21 MED ORDER — IOHEXOL 350 MG/ML SOLN
100.0000 mL | Freq: Once | INTRAVENOUS | Status: AC | PRN
  Administered 2021-09-21: 100 mL via INTRAVENOUS
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
## 2021-09-21 NOTE — ED Notes (Signed)
Patient is resting comfortably.  Just back from CT via stretcher. Husband at bedside

## 2021-09-21 NOTE — Discharge Instructions (Signed)
Please pick up medications and take as prescribed  DO NOT DRIVE OR DRINK ALCOHOL WHILE TAKING THE MUSCLE RELAXER AS IT CAN MAKE YOU DROWSY  Follow up with both your PCP and [facility] for further evaluation of your ED visit and tingling in your left arm  Return to the ED for any new/worsening symptoms
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
## 2021-09-21 NOTE — ED Notes (Signed)
Carie Caddy mother [phone] requesting to be called when patient in a room

## 2021-09-28 DIAGNOSIS — G5620 Lesion of ulnar nerve, unspecified upper limb: Secondary | ICD-10-CM | POA: Diagnosis not present

## 2021-09-28 DIAGNOSIS — G959 Disease of spinal cord, unspecified: Secondary | ICD-10-CM | POA: Diagnosis not present

## 2021-09-28 DIAGNOSIS — I10 Essential (primary) hypertension: Secondary | ICD-10-CM | POA: Diagnosis not present

## 2021-10-05 DIAGNOSIS — G959 Disease of spinal cord, unspecified: Secondary | ICD-10-CM | POA: Diagnosis not present

## 2021-10-05 DIAGNOSIS — G5620 Lesion of ulnar nerve, unspecified upper limb: Secondary | ICD-10-CM | POA: Diagnosis not present

## 2021-10-05 DIAGNOSIS — M4802 Spinal stenosis, cervical region: Secondary | ICD-10-CM | POA: Diagnosis not present

## 2021-10-05 DIAGNOSIS — M50223 Other cervical disc displacement at C6-C7 level: Secondary | ICD-10-CM | POA: Diagnosis not present

## 2021-10-05 DIAGNOSIS — G5602 Carpal tunnel syndrome, left upper limb: Secondary | ICD-10-CM | POA: Diagnosis not present

## 2021-11-09 DIAGNOSIS — G9520 Unspecified cord compression: Secondary | ICD-10-CM | POA: Diagnosis not present

## 2021-11-14 DIAGNOSIS — N1831 Chronic kidney disease, stage 3a: Secondary | ICD-10-CM | POA: Diagnosis not present

## 2021-11-20 ENCOUNTER — Other Ambulatory Visit: Payer: Self-pay | Admitting: Neurological Surgery

## 2021-11-21 DIAGNOSIS — I10 Essential (primary) hypertension: Secondary | ICD-10-CM | POA: Diagnosis not present

## 2021-11-21 DIAGNOSIS — E7800 Pure hypercholesterolemia, unspecified: Secondary | ICD-10-CM | POA: Diagnosis not present

## 2021-11-21 DIAGNOSIS — M4800 Spinal stenosis, site unspecified: Secondary | ICD-10-CM | POA: Diagnosis not present

## 2021-11-21 DIAGNOSIS — I700 Atherosclerosis of aorta: Secondary | ICD-10-CM | POA: Diagnosis not present

## 2021-11-21 DIAGNOSIS — J302 Other seasonal allergic rhinitis: Secondary | ICD-10-CM | POA: Diagnosis not present

## 2021-11-21 DIAGNOSIS — Z01810 Encounter for preprocedural cardiovascular examination: Secondary | ICD-10-CM | POA: Diagnosis not present

## 2021-11-21 DIAGNOSIS — Z01818 Encounter for other preprocedural examination: Secondary | ICD-10-CM | POA: Diagnosis not present

## 2021-11-22 HISTORY — PX: NECK SURGERY: SHX720

## 2021-11-23 DIAGNOSIS — I129 Hypertensive chronic kidney disease with stage 1 through stage 4 chronic kidney disease, or unspecified chronic kidney disease: Secondary | ICD-10-CM | POA: Diagnosis not present

## 2021-11-23 DIAGNOSIS — N1831 Chronic kidney disease, stage 3a: Secondary | ICD-10-CM | POA: Diagnosis not present

## 2021-11-23 DIAGNOSIS — R809 Proteinuria, unspecified: Secondary | ICD-10-CM | POA: Diagnosis not present

## 2021-12-04 NOTE — Pre-Procedure Instructions (Signed)
Surgical Instructions    Your procedure is scheduled on Thursday, February 16th.  Report to [facility] Main Entrance "A" at 05:30 A.M., then check in with the Admitting office.  Call this number if you have problems the morning of surgery:  [phone]   If you have any questions prior to your surgery date call [phone]: Open Monday-Friday 8am-4pm    Remember:  Do not eat after midnight the night before your surgery  You may drink clear liquids until 04:30 AM the morning of your surgery.   Clear liquids allowed are: Water, Non-Citrus Juices (without pulp), Carbonated Beverages, Clear Tea, Black Coffee Only (NO MILK, CREAM OR POWDERED CREAMER of any kind), and Gatorade.    Take these medicines the morning of surgery with A SIP OF WATER  fluticasone (FLONASE) acetaminophen (TYLENOL)- if needed   As of today, STOP taking any Aspirin (unless otherwise instructed by your surgeon) Aleve, Naproxen, Ibuprofen, Motrin, Advil, Goody's, BC's, all herbal medications, fish oil, and all vitamins.                     Do NOT Smoke (Tobacco/Vaping) for 24 hours prior to your procedure.  If you use a CPAP at night, you may bring your mask/headgear for your overnight stay.   Contacts, glasses, piercing's, hearing aid's, dentures or partials may not be worn into surgery, please bring cases for these belongings.    For patients admitted to the hospital, discharge time will be determined by your treatment team.   Patients discharged the day of surgery will not be allowed to drive home, and someone needs to stay with them for 24 hours.  NO VISITORS WILL BE ALLOWED IN PRE-OP WHERE PATIENTS ARE PREPPED FOR SURGERY.  ONLY 1 SUPPORT PERSON MAY BE PRESENT IN THE WAITING ROOM WHILE YOU ARE IN SURGERY.  IF YOU ARE TO BE ADMITTED, ONCE YOU ARE IN YOUR ROOM YOU WILL BE ALLOWED TWO (2) VISITORS. (1) VISITOR MAY STAY OVERNIGHT BUT MUST ARRIVE TO THE ROOM BY 8pm.  Minor children may have two parents present.  Special consideration for safety and communication needs will be reviewed on a case by case basis.   Special instructions:   [place]- Preparing For Surgery  Before surgery, you can play an important role. Because skin is not sterile, your skin needs to be as free of germs as possible. You can reduce the number of germs on your skin by washing with CHG (chlorahexidine gluconate) Soap before surgery.  CHG is an antiseptic cleaner which kills germs and bonds with the skin to continue killing germs even after washing.    Oral Hygiene is also important to reduce your risk of infection.  Remember - BRUSH YOUR TEETH THE MORNING OF SURGERY WITH YOUR REGULAR TOOTHPASTE  Please do not use if you have an allergy to CHG or antibacterial soaps. If your skin becomes reddened/irritated stop using the CHG.  Do not shave (including legs and underarms) for at least 48 hours prior to first CHG shower. It is OK to shave your face.  Please follow these instructions carefully.   Shower the NIGHT BEFORE SURGERY and the MORNING OF SURGERY  If you chose to wash your hair, wash your hair first as usual with your normal shampoo.  After you shampoo, rinse your hair and body thoroughly to remove the shampoo.  Use CHG Soap as you would any other liquid soap. You can apply CHG directly to the skin and wash gently with  a scrungie or a clean washcloth.   Apply the CHG Soap to your body ONLY FROM THE NECK DOWN.  Do not use on open wounds or open sores. Avoid contact with your eyes, ears, mouth and genitals (private parts). Wash Face and genitals (private parts)  with your normal soap.   Wash thoroughly, paying special attention to the area where your surgery will be performed.  Thoroughly rinse your body with warm water from the neck down.  DO NOT shower/wash with your normal soap after using and rinsing off the CHG Soap.  Pat yourself dry with a CLEAN TOWEL.  Wear CLEAN PAJAMAS to bed the night before  surgery  Place CLEAN SHEETS on your bed the night before your surgery  DO NOT SLEEP WITH PETS.   Day of Surgery: Shower with CHG soap. Do not wear jewelry, make up, nail polish, gel polish, artificial nails, or any other type of covering on natural nails including finger and toenails. If patients have artificial nails, gel coating, etc. that need to be removed by a nail salon please have this removed prior to surgery. Surgery may need to be canceled/delayed if the surgeon/anesthesiologist feels like the patient is unable to be adequately monitored. Do not wear lotions, powders, perfumes, or deodorant. Do not shave 48 hours prior to surgery.   Do not bring valuables to the hospital. [facility] is not responsible for any belongings or valuables. Wear Clean/Comfortable clothing the morning of surgery Remember to brush your teeth WITH YOUR REGULAR TOOTHPASTE.   Please read over the following fact sheets that you were given.   3 days prior to your procedure or After your COVID test   You are not required to quarantine however you are required to wear a well-fitting mask when you are out and around people not in your household. If your mask becomes wet or soiled, replace with a new one.   Wash your hands often with soap and water for 20 seconds or clean your hands with an alcohol-based hand sanitizer that contains at least 60% alcohol.   Do not share personal items.   Notify your provider:  o if you are in close contact with someone who has COVID  o or if you develop a fever of 100.4 or greater, sneezing, cough, sore throat, shortness of breath or body aches.

## 2021-12-05 ENCOUNTER — Encounter (HOSPITAL_COMMUNITY)
Admission: RE | Admit: 2021-12-05 | Discharge: 2021-12-05 | Disposition: A | Discharge status: Home / Self Care | Payer: Medicare Other | Source: Ambulatory Visit | Attending: Neurological Surgery | Admitting: Neurological Surgery

## 2021-12-05 ENCOUNTER — Encounter (HOSPITAL_COMMUNITY): Payer: Self-pay

## 2021-12-05 ENCOUNTER — Other Ambulatory Visit: Payer: Self-pay

## 2021-12-05 VITALS — BP 160/90 | HR 69 | Temp 98.4°F | Resp 17 | Ht 63.0 in | Wt 166.8 lb

## 2021-12-05 DIAGNOSIS — Z01818 Encounter for other preprocedural examination: Secondary | ICD-10-CM

## 2021-12-05 DIAGNOSIS — Z8249 Family history of ischemic heart disease and other diseases of the circulatory system: Secondary | ICD-10-CM | POA: Diagnosis not present

## 2021-12-05 DIAGNOSIS — Z20822 Contact with and (suspected) exposure to covid-19: Secondary | ICD-10-CM | POA: Insufficient documentation

## 2021-12-05 DIAGNOSIS — Z86010 Personal history of colonic polyps: Secondary | ICD-10-CM | POA: Diagnosis not present

## 2021-12-05 DIAGNOSIS — Z01812 Encounter for preprocedural laboratory examination: Secondary | ICD-10-CM | POA: Insufficient documentation

## 2021-12-05 DIAGNOSIS — M4802 Spinal stenosis, cervical region: Secondary | ICD-10-CM | POA: Diagnosis not present

## 2021-12-05 DIAGNOSIS — E785 Hyperlipidemia, unspecified: Secondary | ICD-10-CM | POA: Diagnosis not present

## 2021-12-05 DIAGNOSIS — I10 Essential (primary) hypertension: Secondary | ICD-10-CM | POA: Insufficient documentation

## 2021-12-05 DIAGNOSIS — Z85528 Personal history of other malignant neoplasm of kidney: Secondary | ICD-10-CM | POA: Diagnosis not present

## 2021-12-05 DIAGNOSIS — G992 Myelopathy in diseases classified elsewhere: Secondary | ICD-10-CM | POA: Diagnosis not present

## 2021-12-05 DIAGNOSIS — K219 Gastro-esophageal reflux disease without esophagitis: Secondary | ICD-10-CM | POA: Diagnosis not present

## 2021-12-05 LAB — SURGICAL PCR SCREEN
MRSA, PCR: NEGATIVE
Staphylococcus aureus: NEGATIVE

## 2021-12-05 LAB — TYPE AND SCREEN
ABO/RH(D): O POS
Antibody Screen: NEGATIVE

## 2021-12-05 LAB — SARS CORONAVIRUS 2 (TAT 6-24 HRS): SARS Coronavirus 2: NEGATIVE

## 2021-12-05 NOTE — Progress Notes (Signed)
PCP - Dr. Caren Macadam Cardiologist - denies Nephrologist - Dr. Lawson Radar  PPM/ICD - n/a Device Orders - n/a Rep Notified - n/a  Chest x-ray - n/a EKG - 11/21/21 at Dr. Roma Kayser office. Fax sent to request tracing Stress Test - denies ECHO - denies Cardiac Cath - denies  Sleep Study - denies CPAP - n/a  Fasting Blood Sugar - n/a Checks Blood Sugar _____ times a day- n/a  Blood Thinner Instructions: n/a Aspirin Instructions: n/a  ERAS Protcol - Yes PRE-SURGERY Ensure or G2- No  COVID TEST- 12/05/21. Pending.    Anesthesia review: Yes. Requested EKG tracing from Dr. Roma Kayser office. Labs done 11/21/21 at Dr. Roma Kayser office placed in chart and are visible in [place].   Patient denies shortness of breath, fever, cough and chest pain at PAT appointment   All instructions explained to the patient, with a verbal understanding of the material. Patient agrees to go over the instructions while at home for a better understanding. Patient also instructed to self quarantine after being tested for COVID-19. The opportunity to ask questions was provided.

## 2021-12-07 ENCOUNTER — Inpatient Hospital Stay (HOSPITAL_COMMUNITY): Payer: Medicare Other

## 2021-12-07 ENCOUNTER — Encounter (HOSPITAL_COMMUNITY): Payer: Self-pay | Admitting: Neurological Surgery

## 2021-12-07 ENCOUNTER — Inpatient Hospital Stay (HOSPITAL_COMMUNITY): Payer: Medicare Other | Admitting: Anesthesiology

## 2021-12-07 ENCOUNTER — Inpatient Hospital Stay (HOSPITAL_COMMUNITY)
Admission: RE | Admit: 2021-12-07 | Discharge: 2021-12-09 | DRG: 472 | Disposition: A | Discharge status: Home / Self Care | Payer: Medicare Other | Attending: Neurological Surgery | Admitting: Neurological Surgery

## 2021-12-07 ENCOUNTER — Inpatient Hospital Stay (HOSPITAL_COMMUNITY): Payer: Medicare Other | Admitting: Physician Assistant

## 2021-12-07 ENCOUNTER — Other Ambulatory Visit: Payer: Self-pay

## 2021-12-07 ENCOUNTER — Inpatient Hospital Stay (HOSPITAL_COMMUNITY)
Admission: RE | Disposition: A | Discharge status: Home / Self Care | Payer: Self-pay | Source: Home / Self Care | Attending: Neurological Surgery

## 2021-12-07 DIAGNOSIS — G992 Myelopathy in diseases classified elsewhere: Secondary | ICD-10-CM | POA: Diagnosis not present

## 2021-12-07 DIAGNOSIS — G959 Disease of spinal cord, unspecified: Principal | ICD-10-CM | POA: Diagnosis present

## 2021-12-07 DIAGNOSIS — Z8249 Family history of ischemic heart disease and other diseases of the circulatory system: Secondary | ICD-10-CM

## 2021-12-07 DIAGNOSIS — M4802 Spinal stenosis, cervical region: Secondary | ICD-10-CM | POA: Diagnosis not present

## 2021-12-07 DIAGNOSIS — E785 Hyperlipidemia, unspecified: Secondary | ICD-10-CM | POA: Diagnosis present

## 2021-12-07 DIAGNOSIS — G9520 Unspecified cord compression: Secondary | ICD-10-CM | POA: Diagnosis not present

## 2021-12-07 DIAGNOSIS — Z20822 Contact with and (suspected) exposure to covid-19: Secondary | ICD-10-CM | POA: Diagnosis present

## 2021-12-07 DIAGNOSIS — I10 Essential (primary) hypertension: Secondary | ICD-10-CM | POA: Diagnosis present

## 2021-12-07 DIAGNOSIS — Z981 Arthrodesis status: Secondary | ICD-10-CM | POA: Diagnosis not present

## 2021-12-07 DIAGNOSIS — M4322 Fusion of spine, cervical region: Secondary | ICD-10-CM | POA: Diagnosis not present

## 2021-12-07 DIAGNOSIS — Z85528 Personal history of other malignant neoplasm of kidney: Secondary | ICD-10-CM | POA: Diagnosis not present

## 2021-12-07 DIAGNOSIS — Z419 Encounter for procedure for purposes other than remedying health state, unspecified: Secondary | ICD-10-CM

## 2021-12-07 DIAGNOSIS — Z86010 Personal history of colonic polyps: Secondary | ICD-10-CM

## 2021-12-07 DIAGNOSIS — K219 Gastro-esophageal reflux disease without esophagitis: Secondary | ICD-10-CM | POA: Diagnosis present

## 2021-12-07 HISTORY — PX: POSTERIOR CERVICAL FUSION/FORAMINOTOMY: SHX5038

## 2021-12-07 SURGERY — POSTERIOR CERVICAL FUSION/FORAMINOTOMY LEVEL 4
Anesthesia: General

## 2021-12-07 MED ORDER — ACETAMINOPHEN 650 MG RE SUPP: 650.0000 mg | RECTAL | Status: DC | PRN

## 2021-12-07 MED ORDER — SUGAMMADEX SODIUM 200 MG/2ML IV SOLN
INTRAVENOUS | Status: DC | PRN
  Administered 2021-12-07: 200 mg via INTRAVENOUS

## 2021-12-07 MED ORDER — PHENYLEPHRINE 40 MCG/ML (10ML) SYRINGE FOR IV PUSH (FOR BLOOD PRESSURE SUPPORT)
PREFILLED_SYRINGE | INTRAVENOUS | Status: AC
  Filled 2021-12-07: qty 10

## 2021-12-07 MED ORDER — AMISULPRIDE (ANTIEMETIC) 5 MG/2ML IV SOLN: 10.0000 mg | Freq: Once | INTRAVENOUS | Status: DC | PRN

## 2021-12-07 MED ORDER — ONDANSETRON HCL 4 MG/2ML IJ SOLN: 4.0000 mg | Freq: Four times a day (QID) | INTRAMUSCULAR | Status: DC | PRN

## 2021-12-07 MED ORDER — THROMBIN 5000 UNITS EX SOLR
CUTANEOUS | Status: AC
  Filled 2021-12-07: qty 5000

## 2021-12-07 MED ORDER — ONDANSETRON HCL 4 MG/2ML IJ SOLN
INTRAMUSCULAR | Status: AC
  Filled 2021-12-07: qty 2

## 2021-12-07 MED ORDER — ARTIFICIAL TEARS OPHTHALMIC OINT
TOPICAL_OINTMENT | OPHTHALMIC | Status: AC
  Filled 2021-12-07: qty 3.5

## 2021-12-07 MED ORDER — LACTATED RINGERS IV SOLN: INTRAVENOUS | Status: DC | PRN

## 2021-12-07 MED ORDER — PHENYLEPHRINE HCL-NACL 20-0.9 MG/250ML-% IV SOLN
INTRAVENOUS | Status: DC | PRN
  Administered 2021-12-07: 40 ug/min via INTRAVENOUS
  Administered 2021-12-07: 20 ug/min via INTRAVENOUS

## 2021-12-07 MED ORDER — CEFAZOLIN SODIUM-DEXTROSE 2-4 GM/100ML-% IV SOLN
2.0000 g | Freq: Three times a day (TID) | INTRAVENOUS | Status: AC
  Administered 2021-12-07 (×2): 2 g via INTRAVENOUS
  Filled 2021-12-07 (×2): qty 100

## 2021-12-07 MED ORDER — VITAMIN B-12 1000 MCG PO TABS
500.0000 ug | ORAL_TABLET | Freq: Every day | ORAL | Status: DC
  Administered 2021-12-07 – 2021-12-09 (×3): 500 ug via ORAL
  Filled 2021-12-07 (×3): qty 1

## 2021-12-07 MED ORDER — CHLORHEXIDINE GLUCONATE CLOTH 2 % EX PADS: 6.0000 | MEDICATED_PAD | Freq: Once | CUTANEOUS | Status: DC

## 2021-12-07 MED ORDER — DEXAMETHASONE SODIUM PHOSPHATE 10 MG/ML IJ SOLN
INTRAMUSCULAR | Status: AC
  Filled 2021-12-07: qty 1

## 2021-12-07 MED ORDER — SODIUM CHLORIDE 0.9% FLUSH
3.0000 mL | Freq: Two times a day (BID) | INTRAVENOUS | Status: DC
  Administered 2021-12-07 – 2021-12-09 (×5): 3 mL via INTRAVENOUS

## 2021-12-07 MED ORDER — VANCOMYCIN HCL IN DEXTROSE 1-5 GM/200ML-% IV SOLN
1000.0000 mg | INTRAVENOUS | Status: AC
  Administered 2021-12-07: 1000 mg via INTRAVENOUS
  Filled 2021-12-07: qty 200

## 2021-12-07 MED ORDER — LORATADINE 10 MG PO TABS: 10.0000 mg | ORAL_TABLET | Freq: Every evening | ORAL | Status: DC

## 2021-12-07 MED ORDER — ACETAMINOPHEN 500 MG PO TABS
1000.0000 mg | ORAL_TABLET | Freq: Once | ORAL | Status: AC
  Administered 2021-12-07: 1000 mg via ORAL
  Filled 2021-12-07: qty 2

## 2021-12-07 MED ORDER — SODIUM CHLORIDE 0.9 % IV SOLN
250.0000 mL | INTRAVENOUS | Status: DC
  Administered 2021-12-07: 250 mL via INTRAVENOUS

## 2021-12-07 MED ORDER — CLOBETASOL PROPIONATE 0.05 % EX OINT
1.0000 "application " | TOPICAL_OINTMENT | Freq: Two times a day (BID) | CUTANEOUS | Status: DC | PRN
  Filled 2021-12-07: qty 15

## 2021-12-07 MED ORDER — FLUTICASONE PROPIONATE 50 MCG/ACT NA SUSP
1.0000 | Freq: Every day | NASAL | Status: DC
  Filled 2021-12-07: qty 16

## 2021-12-07 MED ORDER — PROPOFOL 10 MG/ML IV BOLUS
INTRAVENOUS | Status: DC | PRN
  Administered 2021-12-07 (×2): 30 mg via INTRAVENOUS
  Administered 2021-12-07: 150 mg via INTRAVENOUS
  Administered 2021-12-07: 30 mg via INTRAVENOUS
  Administered 2021-12-07: 50 mg via INTRAVENOUS

## 2021-12-07 MED ORDER — LACTATED RINGERS IV SOLN: INTRAVENOUS | Status: DC

## 2021-12-07 MED ORDER — MENTHOL 3 MG MT LOZG: 1.0000 | LOZENGE | OROMUCOSAL | Status: DC | PRN

## 2021-12-07 MED ORDER — HYDROMORPHONE HCL 1 MG/ML IJ SOLN: 1.0000 mg | INTRAMUSCULAR | Status: DC | PRN

## 2021-12-07 MED ORDER — OXYCODONE HCL 5 MG PO TABS
10.0000 mg | ORAL_TABLET | ORAL | Status: DC | PRN
  Administered 2021-12-07 – 2021-12-09 (×8): 10 mg via ORAL
  Filled 2021-12-07 (×8): qty 2

## 2021-12-07 MED ORDER — POLYETHYLENE GLYCOL 3350 17 G PO PACK: 17.0000 g | PACK | Freq: Every day | ORAL | Status: DC | PRN

## 2021-12-07 MED ORDER — FENTANYL CITRATE (PF) 250 MCG/5ML IJ SOLN
INTRAMUSCULAR | Status: AC
  Filled 2021-12-07: qty 5

## 2021-12-07 MED ORDER — FENTANYL CITRATE (PF) 100 MCG/2ML IJ SOLN
INTRAMUSCULAR | Status: AC
  Filled 2021-12-07: qty 2

## 2021-12-07 MED ORDER — LIDOCAINE-EPINEPHRINE 1 %-1:100000 IJ SOLN
INTRAMUSCULAR | Status: AC
  Filled 2021-12-07: qty 1

## 2021-12-07 MED ORDER — PHENYLEPHRINE 40 MCG/ML (10ML) SYRINGE FOR IV PUSH (FOR BLOOD PRESSURE SUPPORT)
PREFILLED_SYRINGE | INTRAVENOUS | Status: DC | PRN
  Administered 2021-12-07: 20 ug via INTRAVENOUS
  Administered 2021-12-07: 80 ug via INTRAVENOUS
  Administered 2021-12-07: 40 ug via INTRAVENOUS
  Administered 2021-12-07: 30 ug via INTRAVENOUS

## 2021-12-07 MED ORDER — PROPOFOL 10 MG/ML IV BOLUS
INTRAVENOUS | Status: AC
  Filled 2021-12-07: qty 20

## 2021-12-07 MED ORDER — LIDOCAINE-EPINEPHRINE 1 %-1:100000 IJ SOLN
INTRAMUSCULAR | Status: DC | PRN
  Administered 2021-12-07: 10 mL

## 2021-12-07 MED ORDER — DOCUSATE SODIUM 100 MG PO CAPS
100.0000 mg | ORAL_CAPSULE | Freq: Two times a day (BID) | ORAL | Status: DC
  Administered 2021-12-07 – 2021-12-09 (×5): 100 mg via ORAL
  Filled 2021-12-07 (×5): qty 1

## 2021-12-07 MED ORDER — FENTANYL CITRATE (PF) 100 MCG/2ML IJ SOLN
25.0000 ug | INTRAMUSCULAR | Status: DC | PRN
  Administered 2021-12-07 (×4): 25 ug via INTRAVENOUS

## 2021-12-07 MED ORDER — 0.9 % SODIUM CHLORIDE (POUR BTL) OPTIME
TOPICAL | Status: DC | PRN
  Administered 2021-12-07: 1000 mL

## 2021-12-07 MED ORDER — BACITRACIN ZINC 500 UNIT/GM EX OINT
TOPICAL_OINTMENT | CUTANEOUS | Status: AC
  Filled 2021-12-07: qty 28.35

## 2021-12-07 MED ORDER — DEXAMETHASONE SODIUM PHOSPHATE 10 MG/ML IJ SOLN
INTRAMUSCULAR | Status: DC | PRN
Start: 2021-12-07 — End: 2021-12-07
  Administered 2021-12-07: 10 mg via INTRAVENOUS

## 2021-12-07 MED ORDER — OXYCODONE HCL 5 MG PO TABS: 5.0000 mg | ORAL_TABLET | ORAL | Status: DC | PRN

## 2021-12-07 MED ORDER — CHLORHEXIDINE GLUCONATE 0.12 % MT SOLN
15.0000 mL | Freq: Once | OROMUCOSAL | Status: AC
  Administered 2021-12-07: 15 mL via OROMUCOSAL
  Filled 2021-12-07: qty 15

## 2021-12-07 MED ORDER — ROSUVASTATIN CALCIUM 20 MG PO TABS
20.0000 mg | ORAL_TABLET | Freq: Every day | ORAL | Status: DC
Start: 2021-12-07 — End: 2021-12-09
  Administered 2021-12-07 – 2021-12-08 (×2): 20 mg via ORAL
  Filled 2021-12-07 (×2): qty 1

## 2021-12-07 MED ORDER — ROCURONIUM BROMIDE 10 MG/ML (PF) SYRINGE
PREFILLED_SYRINGE | INTRAVENOUS | Status: DC | PRN
  Administered 2021-12-07: 30 mg via INTRAVENOUS
  Administered 2021-12-07: 50 mg via INTRAVENOUS
  Administered 2021-12-07: 20 mg via INTRAVENOUS

## 2021-12-07 MED ORDER — ONDANSETRON HCL 4 MG PO TABS: 4.0000 mg | ORAL_TABLET | Freq: Four times a day (QID) | ORAL | Status: DC | PRN

## 2021-12-07 MED ORDER — METHOCARBAMOL 500 MG PO TABS
500.0000 mg | ORAL_TABLET | Freq: Four times a day (QID) | ORAL | Status: DC | PRN
  Administered 2021-12-08 – 2021-12-09 (×2): 500 mg via ORAL
  Filled 2021-12-07 (×2): qty 1

## 2021-12-07 MED ORDER — ORAL CARE MOUTH RINSE: 15.0000 mL | Freq: Once | OROMUCOSAL | Status: AC

## 2021-12-07 MED ORDER — ROCURONIUM BROMIDE 10 MG/ML (PF) SYRINGE
PREFILLED_SYRINGE | INTRAVENOUS | Status: AC
  Filled 2021-12-07: qty 20

## 2021-12-07 MED ORDER — NYSTATIN 100000 UNIT/GM EX POWD
1.0000 "application " | Freq: Two times a day (BID) | CUTANEOUS | Status: DC | PRN
  Filled 2021-12-07: qty 15

## 2021-12-07 MED ORDER — ACETAMINOPHEN 325 MG PO TABS: 650.0000 mg | ORAL_TABLET | ORAL | Status: DC | PRN

## 2021-12-07 MED ORDER — ALBUMIN HUMAN 5 % IV SOLN
INTRAVENOUS | Status: DC | PRN
Start: 2021-12-07 — End: 2021-12-07

## 2021-12-07 MED ORDER — SODIUM CHLORIDE 0.9% FLUSH: 3.0000 mL | INTRAVENOUS | Status: DC | PRN

## 2021-12-07 MED ORDER — LIDOCAINE 2% (20 MG/ML) 5 ML SYRINGE
INTRAMUSCULAR | Status: AC
  Filled 2021-12-07: qty 5

## 2021-12-07 MED ORDER — ONDANSETRON HCL 4 MG/2ML IJ SOLN
INTRAMUSCULAR | Status: DC | PRN
Start: 2021-12-07 — End: 2021-12-07
  Administered 2021-12-07: 4 mg via INTRAVENOUS

## 2021-12-07 MED ORDER — BACITRACIN ZINC 500 UNIT/GM EX OINT
TOPICAL_OINTMENT | CUTANEOUS | Status: DC | PRN
  Administered 2021-12-07: 1 via TOPICAL

## 2021-12-07 MED ORDER — FENTANYL CITRATE (PF) 250 MCG/5ML IJ SOLN
INTRAMUSCULAR | Status: DC | PRN
  Administered 2021-12-07: 50 ug via INTRAVENOUS
  Administered 2021-12-07: 100 ug via INTRAVENOUS
  Administered 2021-12-07: 25 ug via INTRAVENOUS
  Administered 2021-12-07: 50 ug via INTRAVENOUS
  Administered 2021-12-07: 25 ug via INTRAVENOUS

## 2021-12-07 MED ORDER — THROMBIN 5000 UNITS EX SOLR
OROMUCOSAL | Status: DC | PRN
  Administered 2021-12-07 (×2): 5 mL via TOPICAL

## 2021-12-07 MED ORDER — PHENOL 1.4 % MT LIQD: 1.0000 | OROMUCOSAL | Status: DC | PRN

## 2021-12-07 MED ORDER — LISINOPRIL 20 MG PO TABS
40.0000 mg | ORAL_TABLET | Freq: Every day | ORAL | Status: DC
  Administered 2021-12-07 – 2021-12-09 (×3): 40 mg via ORAL
  Filled 2021-12-07 (×3): qty 2

## 2021-12-07 MED ORDER — LIDOCAINE 2% (20 MG/ML) 5 ML SYRINGE
INTRAMUSCULAR | Status: DC | PRN
  Administered 2021-12-07: 60 mg via INTRAVENOUS

## 2021-12-07 SURGICAL SUPPLY — 60 items
BAG COUNTER SPONGE SURGICOUNT (BAG) ×3 IMPLANT
BASKET BONE COLLECTION (BASKET) ×1 IMPLANT
BENZOIN TINCTURE PRP APPL 2/3 (GAUZE/BANDAGES/DRESSINGS) IMPLANT
BIT DRILL 2.4 (BIT) ×1 IMPLANT
BLADE CLIPPER SURG (BLADE) ×2 IMPLANT
BUR MATCHSTICK NEURO 3.0 LAGG (BURR) ×1 IMPLANT
BUR PRECISION FLUTE 5.0 (BURR) ×2 IMPLANT
CANISTER SUCT 3000ML PPV (MISCELLANEOUS) ×2 IMPLANT
DECANTER SPIKE VIAL GLASS SM (MISCELLANEOUS) ×1 IMPLANT
DERMABOND ADHESIVE PROPEN (GAUZE/BANDAGES/DRESSINGS) ×1
DERMABOND ADVANCED (GAUZE/BANDAGES/DRESSINGS) ×1
DERMABOND ADVANCED .7 DNX12 (GAUZE/BANDAGES/DRESSINGS) ×1 IMPLANT
DERMABOND ADVANCED .7 DNX6 (GAUZE/BANDAGES/DRESSINGS) IMPLANT
DRAPE C-ARM 42X72 X-RAY (DRAPES) ×4 IMPLANT
DRAPE LAPAROTOMY 100X72 PEDS (DRAPES) ×2 IMPLANT
DURAPREP 6ML APPLICATOR 50/CS (WOUND CARE) ×2 IMPLANT
ELECT REM PT RETURN 9FT ADLT (ELECTROSURGICAL) ×2
ELECTRODE REM PT RTRN 9FT ADLT (ELECTROSURGICAL) ×1 IMPLANT
GAUZE 4X4 16PLY ~~LOC~~+RFID DBL (SPONGE) ×1 IMPLANT
GAUZE SPONGE 4X4 12PLY STRL (GAUZE/BANDAGES/DRESSINGS) IMPLANT
GLOVE EXAM NITRILE LRG STRL (GLOVE) IMPLANT
GLOVE EXAM NITRILE XL STR (GLOVE) IMPLANT
GLOVE EXAM NITRILE XS STR PU (GLOVE) IMPLANT
GLOVE SURG LTX SZ7.5 (GLOVE) ×2 IMPLANT
GLOVE SURG POLYISO LF SZ7 (GLOVE) ×4 IMPLANT
GLOVE SURG POLYISO LF SZ7.5 (GLOVE) ×2 IMPLANT
GLOVE SURG UNDER POLY LF SZ7.5 (GLOVE) ×4 IMPLANT
GOWN STRL REUS W/ TWL LRG LVL3 (GOWN DISPOSABLE) IMPLANT
GOWN STRL REUS W/ TWL XL LVL3 (GOWN DISPOSABLE) ×1 IMPLANT
GOWN STRL REUS W/TWL 2XL LVL3 (GOWN DISPOSABLE) IMPLANT
GOWN STRL REUS W/TWL LRG LVL3 (GOWN DISPOSABLE) ×1
GOWN STRL REUS W/TWL XL LVL3 (GOWN DISPOSABLE) ×1
HEMOSTAT POWDER KIT SURGIFOAM (HEMOSTASIS) ×3 IMPLANT
KIT BASIN OR (CUSTOM PROCEDURE TRAY) ×2 IMPLANT
KIT TURNOVER KIT B (KITS) ×2 IMPLANT
MILL MEDIUM DISP (BLADE) ×1 IMPLANT
NEEDLE HYPO 22GX1.5 SAFETY (NEEDLE) ×2 IMPLANT
NS IRRIG 1000ML POUR BTL (IV SOLUTION) ×2 IMPLANT
PACK LAMINECTOMY NEURO (CUSTOM PROCEDURE TRAY) ×2 IMPLANT
PAD ARMBOARD 7.5X6 YLW CONV (MISCELLANEOUS) ×6 IMPLANT
PIN MAYFIELD SKULL DISP (PIN) ×2 IMPLANT
ROD SPINAL PRE CUT 3.5X70 (Rod) ×2 IMPLANT
SCREW MULTI AXIAL 3.5X14MM (Screw) ×6 IMPLANT
SCREW MULTI AXIAL 4.5X24 (Screw) ×2 IMPLANT
SET SCREW INFINITY IFIX THOR (Screw) ×8 IMPLANT
SPONGE T-LAP 4X18 ~~LOC~~+RFID (SPONGE) ×1 IMPLANT
STAPLER VISISTAT 35W (STAPLE) ×2 IMPLANT
SUT ETHILON 3 0 FSL (SUTURE) IMPLANT
SUT MNCRL AB 3-0 PS2 18 (SUTURE) ×2 IMPLANT
SUT MON AB 3-0 SH 27 (SUTURE) ×1
SUT MON AB 3-0 SH27 (SUTURE) ×1 IMPLANT
SUT VIC AB 0 CT1 18XCR BRD8 (SUTURE) ×1 IMPLANT
SUT VIC AB 0 CT1 8-18 (SUTURE) ×1
SUT VIC AB 2-0 CP2 18 (SUTURE) ×3 IMPLANT
TOWEL GREEN STERILE (TOWEL DISPOSABLE) ×2 IMPLANT
TOWEL GREEN STERILE FF (TOWEL DISPOSABLE) ×2 IMPLANT
TRAY FOL W/BAG SLVR 16FR STRL (SET/KITS/TRAYS/PACK) IMPLANT
TRAY FOLEY W/BAG SLVR 16FR LF (SET/KITS/TRAYS/PACK) ×1
UNDERPAD 30X36 HEAVY ABSORB (UNDERPADS AND DIAPERS) ×2 IMPLANT
WATER STERILE IRR 1000ML POUR (IV SOLUTION) ×2 IMPLANT

## 2021-12-07 NOTE — Transfer of Care (Signed)
Immediate Anesthesia Transfer of Care Note  Patient: Teresa Collins  Procedure(s) Performed: Cervical Four-Cervical Seven Laminectomies/ Cervical Four -Thoracic One Posterior instrumented spinal fusion  Patient Location: PACU  Anesthesia Type:General  Level of Consciousness: drowsy and patient cooperative  Airway & Oxygen Therapy: Patient Spontanous Breathing and Patient connected to face mask oxygen  Post-op Assessment: Report given to RN and Post -op Vital signs reviewed and stable  Post vital signs: Reviewed and stable  Last Vitals:  Vitals Value Taken Time  BP 125/68 12/07/21 1047  Temp    Pulse 76 12/07/21 1049  Resp 28 12/07/21 1049  SpO2 97 % 12/07/21 1049  Vitals shown include unvalidated device data.  Last Pain:  Vitals:   12/07/21 0622  TempSrc:   PainSc: 2          Complications: No notable events documented.

## 2021-12-07 NOTE — TOC Initial Note (Signed)
Transition of Care [facility]) - Initial/Assessment Note    Patient Details  Name: Teresa Collins MRN: [id number] Date of Birth: 1951/04/08  Transition of Care [facility]) CM/SW Contact:    Angelita Ingles, RN Phone [phone]  12/07/2021, 2:50 PM  Clinical Narrative:                  Transition of Care (TOC) Screening Note   Patient Details  Name: Teresa Collins Date of Birth: 03-20-1951   Transition of Care [facility]) CM/SW Contact:    Angelita Ingles, RN Phone Number: 12/07/2021, 2:50 PM    Transition of Care Department [facility]) has reviewed patient and no TOC needs have been identified at this time. We will continue to monitor patient advancement through interdisciplinary progression rounds. TOC will continue to review chart for disposition needs.          Patient Goals and CMS Choice        Expected Discharge Plan and Services                                                Prior Living Arrangements/Services                       Activities of Daily Living      Permission Sought/Granted                  Emotional Assessment              Admission diagnosis:  Cervical myelopathy ([place]) [G95.9] Patient Active Problem List   Diagnosis Date Noted   Cervical myelopathy ([place]) 12/07/2021   Hardening of the aorta (main artery of the heart) ([place]) 01/13/2021   History of partial nephrectomy 01/13/2021   Hypertensive emergency 01/13/2021   Other seasonal allergic rhinitis 01/13/2021   Personal history of other malignant neoplasm of kidney 01/13/2021   Pure hypercholesterolemia 01/13/2021   Thrombocytopenia ([place]) 01/13/2021   Hyperglycemia 19/37/9024   Periumbilical abdominal tenderness without rebound tenderness 10/20/2019   SBO (small bowel obstruction) ([place]) 09/30/2019   Chronic renal disease, stage 3, moderately decreased glomerular filtration rate (GFR) between 30-59 mL/min/1.73 square meter ([place]) 02/12/2019   Hypokalemia  09/73/5329   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 [place] [address] Phone: [phone] Fax: [phone]     Social Determinants of Health ([place]) Interventions    Readmission Risk Interventions No flowsheet data found.

## 2021-12-07 NOTE — Anesthesia Preprocedure Evaluation (Signed)
Anesthesia Evaluation  Patient identified by MRN, date of birth, ID band Patient awake    Reviewed: Allergy & Precautions, NPO status , Patient's Chart, lab work & pertinent test results  Airway Mallampati: II  TM Distance: >3 FB Neck ROM: Full    Dental  (+) Dental Advisory Given   Pulmonary neg pulmonary ROS,    breath sounds clear to auscultation       Cardiovascular hypertension, Pt. on medications  Rhythm:Regular Rate:Normal     Neuro/Psych negative neurological ROS     GI/Hepatic Neg liver ROS, GERD  ,  Endo/Other  negative endocrine ROS  Renal/GU Renal disease     Musculoskeletal  (+) Arthritis ,   Abdominal   Peds  Hematology negative hematology ROS (+)   Anesthesia Other Findings   Reproductive/Obstetrics                             Lab Results  Component Value Date   WBC 4.8 09/21/2021   HGB 15.3 (H) 09/21/2021   HCT 46.5 (H) 09/21/2021   MCV 91.0 09/21/2021   PLT 182 09/21/2021   Lab Results  Component Value Date   CREATININE 1.30 (H) 09/21/2021   BUN 16 09/21/2021   NA 138 09/21/2021   K 4.2 09/21/2021   CL 102 09/21/2021   CO2 27 09/21/2021    Anesthesia Physical Anesthesia Plan  ASA: 2  Anesthesia Plan: General   Post-op Pain Management: Tylenol PO (pre-op)*   Induction:   PONV Risk Score and Plan: 3 and Dexamethasone, Ondansetron and Treatment may vary due to age or medical condition  Airway Management Planned: Oral ETT and Video Laryngoscope Planned  Additional Equipment: None  Intra-op Plan:   Post-operative Plan: Extubation in OR  Informed Consent: I have reviewed the patients History and Physical, chart, labs and discussed the procedure including the risks, benefits and alternatives for the proposed anesthesia with the patient or authorized representative who has indicated his/her understanding and acceptance.     Dental advisory  given  Plan Discussed with:   Anesthesia Plan Comments:         Anesthesia Quick Evaluation

## 2021-12-07 NOTE — Op Note (Signed)
PATIENT: Teresa Collins  DAY OF SURGERY: 12/07/21   PRE-OPERATIVE DIAGNOSIS:  Cervical stenosis with myelopathy   POST-OPERATIVE DIAGNOSIS:  Same   PROCEDURE:  C4-C7 laminectomies, C4-T1 posterior spinal instrumented fusion   SURGEON:  Surgeon(s) and Role:    Judith Part, MD - Primary    Kristeen Miss, MD - Assisting   ANESTHESIA: ETGA   BRIEF HISTORY: This is a 71 year old woman who presented with signs and symptoms of myelopathy. Workup showed cord signal change with significant OPLL. I therefore recommended posterior decompression and instrumented fusion. This was discussed with the patient as well as risks, benefits, and alternatives and wished to proceed with surgery.   OPERATIVE DETAIL: The patient was taken to the operating room and anesthesia was induced by the anesthesia team. The [place] head holder was placed on the head and the patient was carefully positioned prone on the OR table. The head was placed in a neutral position and the [place] was secured to the OR table.   A formal time out was performed with two patient identifiers and confirmed the operative site. The operative site was marked, hair was clipped with surgical clippers, the area was then prepped and draped in a sterile fashion. Fluoro was used to localize the operative level and a midline incision was placed to expose from C4 to T1. Subperiosteal dissection was performed bilaterally and fluoroscopy was again used to confirm the surgical level.   Decompression was performed, which consisted of laminectomies at C4, C5, C6, and C7. These were performed with a combination of high speed drill and rongeurs. Not surprisingly, the ligament was severely calcified and adherent to the dura. I was able to resect this carefully without an obvious durotomy. Again, as expected, the canal was very tight and this required the use of 1 and 78mm kerrison rongeurs for much of the laminectomy, especially at C4. The bone was  saved and morselized to be used as autograft. With the cord well decompressed, attention was then turned to instrumentation.  Lateral mass screws were placed bilaterally at C4, C5, and C6. These were placed by locating standard landmarks, using a burr to breach cortex, and then a CD4 drill with drill guide, palpating for breaches, then placing self-tapping screws.   Fluoroscopy was used to guide placement of bilateral pedicle screws (Medtronic) at T1. These were placed by localizing the pedicle with standard landmarks, drilling a pilot hole, checking entry and trajectory with fluoroscopy, cannulating the pedicle with an awl, palpating for pedicle wall breaches, tapping, palpating, and then placing the screw. 4.5x63mm screws were used bilaterally (Medtronic). The bone was thoroughly decorticated over the fusion surfaces with a high speed drill at C4-5, C5-6, C6-7, and C7-T1 and the previously resected bone fragments were morselized and used as autograft. Rods were placed bilaterally and connected the screw heads and final tightened according to manufacturer torque specifications.  All instrument and sponge counts were correct, the incision was then closed in layers. The patient was then returned to anesthesia for emergence. No apparent complications at the completion of the procedure.   EBL:  365mL   DRAINS: none   SPECIMENS: none   Judith Part, MD 12/07/21 7:30 AM

## 2021-12-07 NOTE — Anesthesia Procedure Notes (Signed)
Procedure Name: Intubation Date/Time: 12/07/2021 7:53 AM Performed by: Lorie Phenix, CRNA Pre-anesthesia Checklist: Patient identified, Emergency Drugs available, Suction available and Patient being monitored Patient Re-evaluated:Patient Re-evaluated prior to induction Oxygen Delivery Method: Circle system utilized Preoxygenation: Pre-oxygenation with 100% oxygen Induction Type: IV induction Ventilation: Mask ventilation without difficulty and Two handed mask ventilation required Laryngoscope Size: Glidescope and 3 Grade View: Grade I Tube type: Oral Tube size: 7.5 mm Number of attempts: 1 Airway Equipment and Method: Rigid stylet Placement Confirmation: ETT inserted through vocal cords under direct vision, positive ETCO2 and breath sounds checked- equal and bilateral Secured at: 22 cm Tube secured with: Tape Dental Injury: Teeth and Oropharynx as per pre-operative assessment  Comments: Elective video laryngoscopy d/t cervical instability

## 2021-12-07 NOTE — H&P (Signed)
Surgical H&P Update  HPI: 71 y.o. with a history of bilateral upper extremity weakness / numbness c/w cervical myelopathy. Workup showed severe cervical stenosis with significant OPLL. No changes in health since they were last seen. Still having the above and wishes to proceed with surgery.  PMHx:  Past Medical History:  Diagnosis Date   Arthritis    ASCUS favor benign 12/2015   negative high risk HPV. Recommend repeat Pap smear in one year   ASCUS of cervix with negative high risk HPV 12/2018   Eczema    GERD (gastroesophageal reflux disease)    Hx of colonic polyps    Hyperlipidemia    Hypertension    Incisional hernia    Numbness and tingling    RT THIGH   Renal cancer ([place]) 2015   Left   STD (sexually transmitted disease)    GC   FamHx:  Family History  Problem Relation Age of Onset   Hypertension Mother    Hypertension Sister    Hypertension Brother    Colon cancer Neg Hx    Esophageal cancer Neg Hx    Rectal cancer Neg Hx    Stomach cancer Neg Hx    Breast cancer Neg Hx    SocHx:  reports that she has never smoked. She has never used smokeless tobacco. She reports that she does not currently use alcohol. She reports that she does not use drugs.  Physical Exam: Strength 5/5 x4 and SILTx4 except L>R hand numbness, +hoffman's bilaterally with inc'd reflexes at the biceps b/l  Assesment/Plan: 71 y.o. woman with cervical myelopathy 2/2 severe canal stenosis with OPLL, here for posterior cervical decompression and instrumented fusion. Risks, benefits, and alternatives discussed and the patient would like to continue with surgery.  -OR today -4NP post-op  Judith Part, MD 12/07/21 7:24 AM

## 2021-12-08 MED FILL — Thrombin For Soln 5000 Unit: CUTANEOUS | Qty: 5000 | Status: AC

## 2021-12-08 NOTE — Progress Notes (Signed)
Neurosurgery Service Progress Note  Subjective: No acute events overnight, lots of post-op neck pain, as expected, but improved this morning,    Objective: Vitals:   12/07/21 1915 12/07/21 2300 12/08/21 0300 12/08/21 0744  BP: 135/76 131/62 120/62 (!) 123/54  Pulse: 78 69 64 70  Resp: 16 14 16 17   Temp: 99.3 F (37.4 C) 98.7 F (37.1 C) 98.8 F (37.1 C) 99.5 F (37.5 C)  TempSrc: Oral Oral Oral Oral  SpO2: 100% 95% 98% 98%  Weight:      Height:        Physical Exam: Strength 5/5 x4 and SILTx4 except L>R hand numbness, +L hoffman's but resolved on the right with normal reflexes, incision c/d/i  Assessment & Plan: 71 y.o. woman s/p C4-C7 lami, C4-T1 PSIF, recovering well.  -PT/OT today -diet / activity as tolerated, no collar needed -likely discharge tomorrow if she's doing well -SCDs/TEDs, SQH POD2 -d/c foley  Judith Part  12/08/21 9:40 AM

## 2021-12-08 NOTE — Evaluation (Signed)
Occupational Therapy Evaluation Patient Details Name: Teresa Collins MRN: [id number] DOB: 10-07-1951 Today's Date: 12/08/2021   History of Present Illness 71 y.o. 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 PMH: arthritis, HTN, renal cancer.   Clinical Impression   PTA pt lives independently with significant other and exercised regularly. Began education regarding compensatory strategies for ADL and mobility for ADL while following cervical precautions. Pt primarily limited by neck and BUE pain/burning. Encouraged pt to keep B hands elevated, frequently move UE/use squeeze ball and keep head elevated with ice on neck for edema and pain management. Will follow acutely to facilitate safe DC home. Any further therapy needs can be addressed at her follow up visit with her neurosurgeon.      Recommendations for follow up therapy are one component of a multi-disciplinary discharge planning process, led by the attending physician.  Recommendations may be updated based on patient status, additional functional criteria and insurance authorization.   Follow Up Recommendations  No OT follow up    Assistance Recommended at Discharge Intermittent Supervision/Assistance  Patient can return home with the following A little help with walking and/or transfers;A little help with bathing/dressing/bathroom;Assistance with cooking/housework;Assist for transportation;Help with stairs or ramp for entrance    Functional Status Assessment  Patient has had a recent decline in their functional status and demonstrates the ability to make significant improvements in function in a reasonable and predictable amount of time.  Equipment Recommendations  None recommended by OT    Recommendations for Other Services       Precautions / Restrictions Precautions Precautions: Fall;Cervical Precaution Booklet Issued: Yes (comment)       Mobility Bed Mobility Overal bed mobility: Needs Assistance Bed Mobility: Rolling, Sidelying to Sit, Sit to Sidelying Rolling: Supervision Sidelying to sit: Supervision     Sit to sidelying: Min guard General bed mobility comments: cues for log rolling    Transfers Overall transfer level: Needs assistance   Transfers: Sit to/from Stand Sit to Stand: Min guard           General transfer comment: ambulated 2 laps in room without use of RW      Balance Overall balance assessment: Mild deficits observed, not formally tested                                         ADL either performed or assessed with clinical judgement   ADL Overall ADL's : Needs assistance/impaired Eating/Feeding: Modified independent   Grooming: Set up;Supervision/safety;Sitting   Upper Body Bathing: Minimal assistance   Lower Body Bathing: Moderate assistance   Upper Body Dressing : Minimal assistance   Lower Body Dressing: Moderate assistance   Toilet Transfer: Min guard;Ambulation   Toileting- Clothing Manipulation and Hygiene: Minimal assistance       Functional mobility during ADLs: Min guard General ADL Comments: began educating pt on cervical precuations for ADL; at baseline can complete figure four positioning, however unable to at this time due to "stiffness"     Vision         Perception     Praxis      Pertinent Vitals/Pain Pain Assessment Pain Assessment: 0-10 Pain Score: 9  Pain Location: BUE Pain Descriptors / Indicators: Aching, Discomfort Pain Intervention(s): Limited activity within patient's tolerance, Premedicated before session     Hand Dominance  Right   Extremity/Trunk Assessment Upper Extremity Assessment Upper Extremity Assessment: Defer to OT evaluation RUE Deficits / Details: ROM WFL; strength functional; reports post op pain; occasionaly dropped items prior to surgery RUE Sensation: decreased light touch LUE Deficits /  Details: ROM overall WFL  - limited by pain; post op pain/burning/numbness, reports getting better   Lower Extremity Assessment Lower Extremity Assessment: Overall WFL for tasks assessed   Cervical / Trunk Assessment Cervical / Trunk Assessment: Neck Surgery   Communication Communication Communication: No difficulties   Cognition  WFL                                           General Comments  significant other in the room who states he can assist at DC; pt with incision open to air visible sutures    Exercises Exercises: Other exercises Other Exercises Other Exercises: squeeze ball B hands Other Exercises: use of elevation to reduce dependent edema hands; encourage frequent movement hands/arms   Shoulder Instructions      Home Living Family/patient expects to be discharged to:: Private residence Living Arrangements: Spouse/significant other Available Help at Discharge: Available 24 hours/day Type of Home: House Home Access: Stairs to enter [employer] of Steps: 6 Entrance Stairs-Rails: Left (switches) Home Layout: Multi-level Alternate Level Stairs-Number of Steps: 7 Alternate Level Stairs-Rails: Right Bathroom Shower/Tub: Tub/shower unit;Curtain   Bathroom Toilet: Handicapped height Bathroom Accessibility: Yes How Accessible: Accessible via [place] Home Equipment: Grab bars - tub/shower;BSC/3in1          Prior Functioning/Environment Prior Level of Function : Independent/Modified Independent             Mobility Comments: cooking, cleaning, driving (retired)          OT Problem List: Decreased coordination;Decreased knowledge of precautions;Pain;Impaired UE functional use      OT Treatment/Interventions: Self-care/ADL training;DME and/or AE instruction;Therapeutic activities;Patient/family education    OT Goals(Current goals can be found in the care plan section) Acute Rehab OT Goals Patient Stated Goal: to have less  pain OT Goal Formulation: With patient Time For Goal Achievement: 12/22/21 Potential to Achieve Goals: Good  OT Frequency: Min 2X/week    Co-evaluation              AM-PAC OT "6 Clicks" Daily Activity     Outcome Measure Help from another person eating meals?: None Help from another person taking care of personal grooming?: A Little Help from another person toileting, which includes using toliet, bedpan, or urinal?: A Little Help from another person bathing (including washing, rinsing, drying)?: A Little Help from another person to put on and taking off regular upper body clothing?: A Little Help from another person to put on and taking off regular lower body clothing?: A Little 6 Click Score: 19   End of Session Equipment Utilized During Treatment: Gait belt Nurse Communication: Mobility status  Activity Tolerance: Patient tolerated treatment well (however painful; unable to tolerate sitting) Patient left: in bed;with call bell/phone within reach;with family/visitor present  OT Visit Diagnosis: Muscle weakness (generalized) (M62.81);Pain                Time: 0240-9735 OT Time Calculation (min): 26 min Charges:  OT General Charges $OT Visit: 1 Visit OT Evaluation $OT Eval Moderate Complexity: 1 Mod OT Treatments $Self Care/Home Management : 8-22 mins  Vickey Ewbank, OT/L   Acute OT Clinical  Specialist [place] Pager [phone] Office [phone]   [facility] 12/08/2021, 3:24 PM

## 2021-12-08 NOTE — Evaluation (Signed)
Physical Therapy Evaluation Patient Details Name: Teresa Collins MRN: [id number] DOB: [birth year] Today's Date: 12/08/2021  History of Present Illness  71 y.o. 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 PMH: arthritis, HTN, renal cancer.  Clinical Impression  Patient presents with decreased mobility due to pain, new cervical precautions and general weakness post op.  Shwe was independent performing IADL's and working out prior to this episode.  She was able to move with min A up to bathroom with pain in UE's unable to tolerate up in chair more than a few minutes so assisted back to bed and pain im proved.  Feel she will benefit from skilled PT in the acute setting and likely to progress and not need follow up PT at d/c.       Recommendations for follow up therapy are one component of a multi-disciplinary discharge planning process, led by the attending physician.  Recommendations may be updated based on patient status, additional functional criteria and insurance authorization.  Follow Up Recommendations No PT follow up    Assistance Recommended at Discharge PRN  Patient can return home with the following  Assistance with cooking/housework;Assist for transportation;Help with stairs or ramp for entrance;A little help with bathing/dressing/bathroom    Equipment Recommendations Rolling Ferrell (2 wheels)  Recommendations for Other Services       Functional Status Assessment Patient has had a recent decline in their functional status and demonstrates the ability to make significant improvements in function in a reasonable and predictable amount of time.     Precautions / Restrictions Precautions Precautions: Fall;Cervical Precaution Booklet Issued: Yes (comment)      Mobility  Bed Mobility Overal bed mobility: Needs Assistance Bed Mobility: Rolling, Sidelying to Sit Rolling:  Supervision Sidelying to sit: Min guard       General bed mobility comments: cues for technique, assist as pt painful with movement    Transfers Overall transfer level: Needs assistance Equipment used: Rolling Dettloff (2 wheels) Transfers: Sit to/from Stand, Bed to chair/wheelchair/BSC Sit to Stand: Min assist   Step pivot transfers: Min assist       General transfer comment: cues for hand in her lap as painful and pushed up with her legs to stand, RW use due to UE pain/heaviness; assisted back to bed from chair due to not well tolerated up in chair and did not use device    Ambulation/Gait Ambulation/Gait assistance: Min assist Gait Distance (Feet): 12 Feet (x 2) Assistive device: Rolling Arai (2 wheels) Gait Pattern/deviations: Step-through pattern, Decreased stride length       General Gait Details: slow pace, assist with RW management due to pain in shoulders; walked to bathroom and back to chair  Stairs            Wheelchair Mobility    Modified Rankin (Stroke Patients Only)       Balance Overall balance assessment: Needs assistance   Sitting balance-Leahy Scale: Fair       Standing balance-Leahy Scale: Fair Standing balance comment: used [place] for UE support, not truly for balance                             Pertinent Vitals/Pain Pain Assessment Pain Assessment: 0-10 Pain Score: 4  Pain Location: L shoulder/arm/neck Pain Descriptors / Indicators: Aching, Discomfort Pain Intervention(s): Monitored during session, Premedicated before session    Home Living Family/patient  expects to be discharged to:: Private residence Living Arrangements: Spouse/significant other Available Help at Discharge: Available 24 hours/day Type of Home: House Home Access: Stairs to enter Entrance Stairs-Rails: Left (switches) Entrance Stairs-Number of Steps: 6 Alternate Level Stairs-Number of Steps: 7 Home Layout: Multi-level Home Equipment: Grab bars  - tub/shower;BSC/3in1      Prior Function Prior Level of Function : Independent/Modified Independent             Mobility Comments: cooking, cleaning, driving (retired)       Journalist, newspaper   Dominant Hand: Right    Extremity/Trunk Assessment   Upper Extremity Assessment Upper Extremity Assessment: Defer to OT evaluation    Lower Extremity Assessment Lower Extremity Assessment: Overall WFL for tasks assessed    Cervical / Trunk Assessment Cervical / Trunk Assessment: Neck Surgery  Communication   Communication: No difficulties  Cognition Arousal/Alertness: Awake/alert Behavior During Therapy: WFL for tasks assessed/performed Overall Cognitive Status: Within Functional Limits for tasks assessed                                          General Comments General comments (skin integrity, edema, etc.): significant other in the room; pt with incision open to air visible sutures    Exercises Other Exercises Other Exercises: supine shoulder and head press very gentle x 3 reps 5 sec hold for postural strength   Assessment/Plan    PT Assessment Patient needs continued PT services  PT Problem List Decreased balance;Decreased strength;Decreased mobility;Decreased activity tolerance;Pain;Decreased knowledge of precautions       PT Treatment Interventions DME instruction;Functional mobility training;Stair training;Therapeutic exercise;Gait training;Therapeutic activities;Patient/family education    PT Goals (Current goals can be found in the Care Plan section)  Acute Rehab PT Goals Patient Stated Goal: to go home, help pain PT Goal Formulation: With patient Time For Goal Achievement: 12/15/21 Potential to Achieve Goals: Good    Frequency Min 5X/week     Co-evaluation               AM-PAC PT "6 Clicks" Mobility  Outcome Measure Help needed turning from your back to your side while in a flat bed without using bedrails?: A Little Help needed  moving from lying on your back to sitting on the side of a flat bed without using bedrails?: A Little Help needed moving to and from a bed to a chair (including a wheelchair)?: A Little Help needed standing up from a chair using your arms (e.g., wheelchair or bedside chair)?: A Little Help needed to walk in hospital room?: Total Help needed climbing 3-5 steps with a railing? : Total 6 Click Score: 14    End of Session Equipment Utilized During Treatment: Gait belt Activity Tolerance: Patient limited by pain Patient left: in bed;with call bell/phone within reach;with bed alarm set;with family/visitor present   PT Visit Diagnosis: Difficulty in walking, not elsewhere classified (R26.2);Pain Pain - Right/Left: Right (both) Pain - part of body: Shoulder    Time: 1208-1232 PT Time Calculation (min) (ACUTE ONLY): 24 min   Charges:   PT Evaluation $PT Eval Low Complexity: 1 Low PT Treatments $Therapeutic Activity: 8-22 mins        Magda Kiel, PT Acute Rehabilitation Services WNIOE:[phone] Office:[phone] 12/08/2021   Reginia Naas 12/08/2021, 1:09 PM

## 2021-12-09 MED ORDER — OXYCODONE HCL 5 MG PO TABS: 5.0000 mg | ORAL_TABLET | ORAL | 0 refills | Status: DC | PRN

## 2021-12-09 NOTE — Discharge Summary (Signed)
Discharge Summary  Date of Admission: 12/07/2021  Date of Discharge: 12/09/21  Attending Physician: Emelda Brothers, MD  Hospital Course: Patient was admitted following an uncomplicated D4-X1 laminectomy and C4-T1 PSIF. They were recovered in PACU and transferred to 4NP. Their preop symptoms were significantly improved, their hospital course was uncomplicated and the patient was discharged home on 12/09/20. They will follow up in clinic with me in clinic in 2 weeks.  Neurologic exam at discharge:  Strength 5/5 x4 and SILTx4 except L hand numbness, +minimal L hoffman's but resolved on the right with normal reflexes, incision c/d/i  Discharge diagnosis: Cervical myelopathy  Judith Part, MD 12/09/21 8:51 AM

## 2021-12-09 NOTE — Progress Notes (Signed)
Neurosurgery Service Progress Note  Subjective: No acute events overnight, no neck pain, only pain complaint is bilateral L>R proximal shoulder pain, which is improving, hand numbness / strength is improving  Objective: Vitals:   12/08/21 1930 12/08/21 2311 12/09/21 0313 12/09/21 0733  BP: 135/61 (!) 158/72 (!) 152/71 138/67  Pulse: 73 64 64 73  Resp: 17 18 16 16   Temp: 99.6 F (37.6 C) 99.5 F (37.5 C) 98 F (36.7 C) 98.1 F (36.7 C)  TempSrc: Oral Oral Oral Oral  SpO2: 97% 98% 97% 99%  Weight:      Height:        Physical Exam: Strength 5/5 x4 and SILTx4 except L hand numbness, +minimal L hoffman's but resolved on the right with normal reflexes, incision c/d/i  Assessment & Plan: 72 y.o. woman s/p C4-C7 lami, C4-T1 PSIF, recovering well.  -discharge home today  Judith Part  12/09/21 8:50 AM

## 2021-12-09 NOTE — Progress Notes (Signed)
Pt with discharge orders, discharge paperwork reviewed with patient and all questions answered. IV's removed. Pt taken down via wheelchair with all belongings.

## 2021-12-09 NOTE — Progress Notes (Signed)
Occupational Therapy Treatment Patient Details Name: Teresa Collins MRN: [id number] DOB: 1951-08-23 Today's Date: 12/09/2021   History of present illness 71 y.o. 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 PMH: arthritis, HTN, renal cancer.   OT comments  Pt. Seen for skilled OT treatment prior to d/c home later today.  Education provided on fall prevention strategies.  Pt. Able to verbalize understanding and provide examples she already has in place.  Reviewed cervical precautions in relation to adl completion. Pt. Reports she has necessary dme for safety and to aide in energy conservation which was also reviewed today.  Pt. Declines need to review LB adls as her husband plans to assist her initially until she can safely reach b les.   Eager for d/c home later today.    Recommendations for follow up therapy are one component of a multi-disciplinary discharge planning process, led by the attending physician.  Recommendations may be updated based on patient status, additional functional criteria and insurance authorization.    Follow Up Recommendations  No OT follow up    Assistance Recommended at Discharge Intermittent Supervision/Assistance  Patient can return home with the following  A little help with walking and/or transfers;A little help with bathing/dressing/bathroom;Assistance with cooking/housework;Assist for transportation;Help with stairs or ramp for entrance   Equipment Recommendations  None recommended by OT    Recommendations for Other Services      Precautions / Restrictions Precautions Precautions: Fall;Cervical Precaution Booklet Issued: Yes (comment) Required Braces or Orthoses:  (no brace needed) Restrictions Weight Bearing Restrictions: No       Mobility Bed Mobility                    Transfers                         Balance                                            ADL either performed or assessed with clinical judgement   ADL Overall ADL's : Needs assistance/impaired                                       General ADL Comments: education continued with cervical precautions for adls, reports husband able to assist with LB adls until she can fully complete figure four position.  provided fall prevention review and pt. able to contribute with strategies already in place from previous medical situations in her life.  reviewed energy conservation also.  pt. reports "honey im gonna milk this all the way" and laughed.  reports she prepared for this by making a lot of food in advance and it is in the freezer for easy prep from her husband.  also noted she will use 3n1 in her shower to sit on as needed. plans for use of walk in shower that is downstairs    Extremity/Trunk Assessment              Vision       Perception     Praxis      Cognition Arousal/Alertness: Awake/alert Behavior During Therapy: WFL for tasks assessed/performed Overall Cognitive Status: Within Functional Limits for tasks  assessed                                          Exercises      Shoulder Instructions       General Comments pt diaphoretic with activity, hypertensive upon completion of mobility 167/81.    Pertinent Vitals/ Pain       Pain Assessment Pain Assessment: No/denies pain  Home Living                                          Prior Functioning/Environment              Frequency  Min 2X/week        Progress Toward Goals  OT Goals(current goals can now be found in the care plan section)  Progress towards OT goals: Progressing toward goals     Plan Discharge plan remains appropriate    Co-evaluation                 AM-PAC OT "6 Clicks" Daily Activity     Outcome Measure   Help from another person eating meals?: None Help from another  person taking care of personal grooming?: A Little Help from another person toileting, which includes using toliet, bedpan, or urinal?: A Little Help from another person bathing (including washing, rinsing, drying)?: A Little Help from another person to put on and taking off regular upper body clothing?: A Little Help from another person to put on and taking off regular lower body clothing?: A Little 6 Click Score: 19    End of Session    OT Visit Diagnosis: Muscle weakness (generalized) (M62.81);Pain   Activity Tolerance Patient tolerated treatment well   Patient Left in bed;with call bell/phone within reach   Nurse Communication          Time: 2563-8937 OT Time Calculation (min): 10 min  Charges: OT General Charges $OT Visit: 1 Visit OT Treatments $Self Care/Home Management : 8-22 mins  Sonia Baller, COTA/L Acute Rehabilitation [phone]   Tanya Nones 12/09/2021, 10:21 AM

## 2021-12-09 NOTE — Progress Notes (Signed)
Physical Therapy Treatment Patient Details Name: Teresa Collins MRN: [id number] DOB: 04-Nov-1950 Today's Date: 12/09/2021   History of Present Illness 71 y.o. 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 PMH: arthritis, HTN, renal cancer.    PT Comments    Pt limited by reports of burning pain in BUEs. Pt is able to ambulate for increased distances and negotiates a flight of stairs during session. Pt has assistance from significant other and demonstrates good balance with use of RW or railing. Pt will benefit from continued frequent mobilization in an effort to restore independence.   Recommendations for follow up therapy are one component of a multi-disciplinary discharge planning process, led by the attending physician.  Recommendations may be updated based on patient status, additional functional criteria and insurance authorization.  Follow Up Recommendations  No PT follow up (likely will benefit from outpatient PT once cleared for neck exercise)     Assistance Recommended at Discharge PRN  Patient can return home with the following Assistance with cooking/housework;Assist for transportation;Help with stairs or ramp for entrance;A little help with bathing/dressing/bathroom   Equipment Recommendations  None recommended by PT (pt reports owning a RW)    Recommendations for Other Services       Precautions / Restrictions Precautions Precautions: Fall;Cervical Precaution Booklet Issued: Yes (comment) Required Braces or Orthoses:  (no brace needed) Restrictions Weight Bearing Restrictions: No     Mobility  Bed Mobility Overal bed mobility: Needs Assistance Bed Mobility: Rolling, Sidelying to Sit Rolling: Supervision Sidelying to sit: Supervision, HOB elevated            Transfers Overall transfer level: Needs assistance Equipment used: Rolling Lappe (2 wheels) Transfers: Sit  to/from Stand Sit to Stand: Supervision           General transfer comment: verbal cues for hand placement    Ambulation/Gait Ambulation/Gait assistance: Supervision Gait Distance (Feet): 60 Feet Assistive device: Rolling Luckenbaugh (2 wheels) Gait Pattern/deviations: Step-through pattern Gait velocity: reduced Gait velocity interpretation: 1.31 - 2.62 ft/sec, indicative of limited community ambulator   General Gait Details: pt with slowed step-through gait   Stairs Stairs: Yes Stairs assistance: Min guard Stair Management: One rail Left, Alternating pattern, Forwards Number of Stairs: 10 General stair comments: step-to pattern when descending   Wheelchair Mobility    Modified Rankin (Stroke Patients Only)       Balance Overall balance assessment: Needs assistance Sitting-balance support: Feet supported, No upper extremity supported Sitting balance-Leahy Scale: Fair     Standing balance support: Bilateral upper extremity supported, Single extremity supported, Reliant on assistive device for balance Standing balance-Leahy Scale: Poor                              Cognition Arousal/Alertness: Awake/alert Behavior During Therapy: WFL for tasks assessed/performed Overall Cognitive Status: Within Functional Limits for tasks assessed                                          Exercises      General Comments General comments (skin integrity, edema, etc.): pt diaphoretic with activity, hypertensive upon completion of mobility 167/81.      Pertinent Vitals/Pain Pain Assessment Pain Assessment: 0-10 Pain Score: 10-Worst pain ever Pain Location: BUE Pain Descriptors / Indicators: Burning  Pain Intervention(s): Monitored during session    Home Living                          Prior Function            PT Goals (current goals can now be found in the care plan section) Acute Rehab PT Goals Patient Stated Goal: to go home,  help pain Progress towards PT goals: Progressing toward goals    Frequency    Min 5X/week      PT Plan Current plan remains appropriate    Co-evaluation              AM-PAC PT "6 Clicks" Mobility   Outcome Measure  Help needed turning from your back to your side while in a flat bed without using bedrails?: A Little Help needed moving from lying on your back to sitting on the side of a flat bed without using bedrails?: A Little Help needed moving to and from a bed to a chair (including a wheelchair)?: A Little Help needed standing up from a chair using your arms (e.g., wheelchair or bedside chair)?: A Little Help needed to walk in hospital room?: A Little Help needed climbing 3-5 steps with a railing? : A Little 6 Click Score: 18    End of Session   Activity Tolerance: Patient limited by pain Patient left: in chair;with call bell/phone within reach;with family/visitor present Nurse Communication: Mobility status PT Visit Diagnosis: Difficulty in walking, not elsewhere classified (R26.2);Pain Pain - Right/Left: Right Pain - part of body: Shoulder     Time: 5300-5110 PT Time Calculation (min) (ACUTE ONLY): 27 min  Charges:  $Gait Training: 8-22 mins $Therapeutic Activity: 8-22 mins                     Zenaida Niece, PT, DPT Acute Rehabilitation Pager: [phone] Office [place] [facility] Bram 12/09/2021, 10:06 AM

## 2021-12-11 ENCOUNTER — Encounter (HOSPITAL_COMMUNITY): Payer: Self-pay | Admitting: Neurological Surgery

## 2021-12-13 NOTE — Anesthesia Postprocedure Evaluation (Signed)
Anesthesia Post Note  Patient: Teresa Collins  Procedure(s) Performed: Cervical Four-Cervical Seven Laminectomies/ Cervical Four -Thoracic One Posterior instrumented spinal fusion     Patient location during evaluation: PACU Anesthesia Type: General Level of consciousness: awake and alert Pain management: pain level controlled Vital Signs Assessment: post-procedure vital signs reviewed and stable Respiratory status: spontaneous breathing, nonlabored ventilation, respiratory function stable and patient connected to nasal cannula oxygen Cardiovascular status: blood pressure returned to baseline and stable Postop Assessment: no apparent nausea or vomiting Anesthetic complications: no   No notable events documented.  Last Vitals:  Vitals:   12/09/21 0313 12/09/21 0733  BP: (!) 152/71 138/67  Pulse: 64 73  Resp: 16 16  Temp: 36.7 C 36.7 C  SpO2: 97% 99%    Last Pain:  Vitals:   12/09/21 0959  TempSrc:   PainSc: 10-Worst pain ever                 Tiajuana Amass

## 2022-01-09 NOTE — Progress Notes (Signed)
71 y.o. [id number] Single Black or African American Not Hispanic or Latino female here for annual exam.  No vaginal bleeding. Not sexually active secondary to ED. Lives with her partner.  ?  ?She had neck surgery 6 weeks ago. Slowly healing. Still with residual numbness and weakness. She is seeing improvement.  ? ?No LMP recorded. Patient is postmenopausal.          ?Sexually active: No.  ?The current method of family planning is post menopausal status.    ?Exercising: No.  The patient does not participate in regular exercise at present. ?Smoker:  no ? ?Health Maintenance: ?Pap:  01/13/20 Neg ?            01/12/19 ASCUS:Neg HR HPV ?History of abnormal Pap:  yes, no surgery on her cervix.  ?MMG:  04/25/21 Bi-rads 1 neg  ?BMD:   09/06/21  osteopenic/ low bone mass (with primary) ?Colonoscopy: 02/18/2017  f/u 5 years. She will call ?TDaP:  12/19/16  ?Gardasil: n/a ? ? reports that she has never smoked. She has never used smokeless tobacco. She reports that she does not currently use alcohol. She reports that she does not use drugs.  ?Retired. 1 daughter, 2 grandchildren (9 and 63). Family in in [place].  ? ?Past Medical History:  ?Diagnosis Date  ? Arthritis   ? ASCUS favor benign 12/2015  ? negative high risk HPV. Recommend repeat Pap smear in one year  ? ASCUS of cervix with negative high risk HPV 12/2018  ? Eczema   ? GERD (gastroesophageal reflux disease)   ? Hx of colonic polyps   ? Hyperlipidemia   ? Hypertension   ? Incisional hernia   ? Numbness and tingling   ? RT THIGH  ? Renal cancer ([place]) 2015  ? Left  ? STD (sexually transmitted disease)   ? GC  ? ? ?Past Surgical History:  ?Procedure Laterality Date  ? abortions    ? CARPAL TUNNEL RELEASE Left 2017  ? CESAREAN SECTION    ? 1 time  ? INSERTION OF MESH N/A 07/21/2014  ? Procedure: INSERTION OF MESH;  Surgeon: Coralie Keens, MD;  Location: [facility];  Service: General;  Laterality: N/A;  ? MYOMECTOMY ABDOMINAL APPROACH    ? POSTERIOR CERVICAL FUSION/FORAMINOTOMY N/A  12/07/2021  ? Procedure: Cervical Four-Cervical Seven Laminectomies/ Cervical Four -Thoracic One Posterior instrumented spinal fusion;  Surgeon: Judith Part, MD;  Location: [facility];  Service: Neurosurgery;  Laterality: N/A;  Cervical Four-Cervical Seven Laminectomies/ Cervical Four -Thoracic One Posterior instrumented spinal fusion  ? ROBOTIC ASSITED PARTIAL NEPHRECTOMY Left 07/21/2014  ? Procedure: ROBOTIC ASSISTED LEFT PARTIAL NEPHRECTOMY;  Surgeon: Ardis Hughs, MD;  Location: [facility];  Service: Urology;  Laterality: Left;  ? TUBAL LIGATION    ? VENTRAL HERNIA REPAIR N/A 07/21/2014  ? Procedure: INCISIONAL HERNIA REPAIR WITH MESH ;  Surgeon: Coralie Keens, MD;  Location: [facility];  Service: General;  Laterality: N/A;  ? ? ?Current Outpatient Medications  ?Medication Sig Dispense Refill  ? acetaminophen (TYLENOL) 500 MG tablet Take 1,000 mg by mouth every 6 (six) hours as needed for headache.    ? clobetasol ointment (TEMOVATE) 2.20 % 1 application 2 (two) times daily as needed (eczema).    ? Cyanocobalamin (VITAMIN B 12) 500 MCG TABS Take 500 mcg by mouth daily.    ? fluticasone (FLONASE) 50 MCG/ACT nasal spray Place 1 spray into both nostrils daily.    ? levocetirizine (XYZAL) 5 MG tablet Take 5  mg by mouth every evening.    ? lisinopril (ZESTRIL) 40 MG tablet Take 40 mg by mouth daily.    ? methocarbamol (ROBAXIN) 500 MG tablet Take 1 tablet (500 mg total) by mouth 2 (two) times daily. 20 tablet 0  ? nystatin (MYCOSTATIN/NYSTOP) powder Apply 1 application topically 2 (two) times daily as needed (irritation).    ? rosuvastatin (CRESTOR) 20 MG tablet Take 20 mg by mouth at bedtime.    ? ?No current facility-administered medications for this visit.  ? ? ?Family History  ?Problem Relation Age of Onset  ? Hypertension Mother   ? Hypertension Sister   ? Hypertension Brother   ? Colon cancer Neg Hx   ? Esophageal cancer Neg Hx   ? Rectal cancer Neg Hx   ? Stomach cancer Neg Hx   ? Breast cancer Neg Hx    ? ? ?Review of Systems  ?All other systems reviewed and are negative. ? ?Exam:   ?BP 122/76   Pulse 82   Ht '5\' 3"'$  (1.6 m)   Wt 174 lb (78.9 kg)   SpO2 99%   BMI 30.82 kg/m?   Weight change: '@WEIGHTCHANGE'$ @ Height:   Height: '5\' 3"'$  (160 cm)  ?Ht Readings from Last 3 Encounters:  ?01/18/22 '5\' 3"'$  (1.6 m)  ?12/07/21 '5\' 3"'$  (1.6 m)  ?12/05/21 '5\' 3"'$  (1.6 m)  ? ? ?General appearance: alert, cooperative and appears stated age ?Head: Normocephalic, without obvious abnormality, atraumatic ?Neck: no adenopathy, supple, symmetrical, trachea midline and thyroid normal to inspection and palpation ?Breasts: normal appearance, no masses or tenderness ?Abdomen: soft, non-tender; non distended,  no masses,  no organomegaly ?Extremities: extremities normal, atraumatic, no cyanosis or edema ?Skin: Skin color, texture, turgor normal. No rashes or lesions ?Lymph nodes: Cervical, supraclavicular, and axillary nodes normal. ?No abnormal inguinal nodes palpated ?Neurologic: Grossly normal ? ? ?Pelvic: External genitalia:  no lesions ?             Urethra:  normal appearing urethra with no masses, tenderness or lesions ?             Bartholins and Skenes: normal    ?             Vagina: normal appearing vagina with normal color and discharge, no lesions ?             Cervix: no lesions ?              ?Bimanual Exam:  Uterus:  normal size, contour, position, consistency, mobility, non-tender ?             Adnexa: no mass, fullness, tenderness ?              Rectovaginal: Confirms ?              Anus:  normal sphincter tone, no lesions ? ?Gae Dry chaperoned for the exam. ? ?1. Gynecologic exam normal ?- Ambulatory referral to Gastroenterology ?Discussed breast self exam ?Discussed calcium and vit D intake ?Mammogram due in 7/23 ?Labs with primary ?DEXA UTD with her primary ? ?2. Colon cancer screening ?- Ambulatory referral to Gastroenterology ? ? ?

## 2022-01-18 ENCOUNTER — Ambulatory Visit (INDEPENDENT_AMBULATORY_CARE_PROVIDER_SITE_OTHER): Payer: Medicare Other | Admitting: Obstetrics and Gynecology

## 2022-01-18 ENCOUNTER — Encounter: Payer: Self-pay | Admitting: Obstetrics and Gynecology

## 2022-01-18 VITALS — BP 122/76 | HR 82 | Ht 63.0 in | Wt 174.0 lb

## 2022-01-18 DIAGNOSIS — Z9189 Other specified personal risk factors, not elsewhere classified: Secondary | ICD-10-CM | POA: Diagnosis not present

## 2022-01-18 DIAGNOSIS — Z8619 Personal history of other infectious and parasitic diseases: Secondary | ICD-10-CM

## 2022-01-18 DIAGNOSIS — Z1211 Encounter for screening for malignant neoplasm of colon: Secondary | ICD-10-CM

## 2022-01-18 DIAGNOSIS — Z01419 Encounter for gynecological examination (general) (routine) without abnormal findings: Secondary | ICD-10-CM

## 2022-01-18 NOTE — Patient Instructions (Signed)

## 2022-02-01 ENCOUNTER — Encounter: Payer: Self-pay | Admitting: Internal Medicine

## 2022-02-12 ENCOUNTER — Ambulatory Visit (AMBULATORY_SURGERY_CENTER): Payer: Medicare Other | Admitting: *Deleted

## 2022-02-12 VITALS — Ht 63.0 in | Wt 167.0 lb

## 2022-02-12 DIAGNOSIS — Z86010 Personal history of colonic polyps: Secondary | ICD-10-CM

## 2022-02-12 MED ORDER — NA SULFATE-K SULFATE-MG SULF 17.5-3.13-1.6 GM/177ML PO SOLN: 2.0000 | Freq: Once | ORAL | 0 refills | Status: AC

## 2022-02-12 NOTE — Progress Notes (Signed)
No egg or soy allergy known to patient  ?No issues known to pt with past sedation with any surgeries or procedures ?Patient denies ever being told they had issues or difficulty with intubation  ?No FH of Malignant Hyperthermia ?Pt is not on diet pills ?Pt is not on  home 02  ?Pt is not on blood thinners  ?Pt denies issues with constipation  ?No A fib or A flutter ? ? ?PV completed over the phone. Pt verified name, DOB, address and insurance during PV today.  ?Pt mailed instruction packet with copy of consent form to read and not return, and instructions.  ?Pt encouraged to call with questions or issues.  ?If pt has My chart, procedure instructions sent via My Chart  ?Insurance confirmed with pt at PV today   ?

## 2022-03-05 ENCOUNTER — Encounter: Payer: Self-pay | Admitting: Internal Medicine

## 2022-03-05 ENCOUNTER — Ambulatory Visit (AMBULATORY_SURGERY_CENTER): Payer: Medicare Other | Admitting: Internal Medicine

## 2022-03-05 VITALS — BP 168/70 | HR 52 | Temp 96.4°F | Resp 11 | Ht 63.0 in | Wt 167.0 lb

## 2022-03-05 DIAGNOSIS — D120 Benign neoplasm of cecum: Secondary | ICD-10-CM | POA: Diagnosis not present

## 2022-03-05 DIAGNOSIS — Z86010 Personal history of colonic polyps: Secondary | ICD-10-CM

## 2022-03-05 DIAGNOSIS — D122 Benign neoplasm of ascending colon: Secondary | ICD-10-CM

## 2022-03-05 DIAGNOSIS — N183 Chronic kidney disease, stage 3 unspecified: Secondary | ICD-10-CM | POA: Diagnosis not present

## 2022-03-05 MED ORDER — SODIUM CHLORIDE 0.9 % IV SOLN: 500.0000 mL | Freq: Once | INTRAVENOUS | Status: DC

## 2022-03-05 NOTE — Progress Notes (Signed)
HISTORY OF PRESENT ILLNESS: ? ?Teresa Collins is a 71 y.o. 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  Multiple prior examinations.  Now for surveillance colonoscopy at the appropriate time.  No active complaints.  Tolerated prep ? ?REVIEW OF SYSTEMS: ? ?All non-GI ROS negative. ?Past Medical History:  ?Diagnosis Date  ? Arthritis   ? ASCUS favor benign 12/2015  ? negative high risk HPV. Recommend repeat Pap smear in one year  ? ASCUS of cervix with negative high risk HPV 12/2018  ? Eczema   ? GERD (gastroesophageal reflux disease)   ? Hx of colonic polyps   ? Hyperlipidemia   ? Hypertension   ? Incisional hernia   ? Numbness and tingling   ? RT THIGH  ? Renal cancer ([place]) 2015  ? Left  ? STD (sexually transmitted disease)   ? GC  ? ? ?Past Surgical History:  ?Procedure Laterality Date  ? abortions    ? CARPAL TUNNEL RELEASE Left 2017  ? CESAREAN SECTION    ? 1 time  ? INSERTION OF MESH N/A 07/21/2014  ? Procedure: INSERTION OF MESH;  Surgeon: Coralie Keens, MD;  Location: [facility];  Service: General;  Laterality: N/A;  ? MYOMECTOMY ABDOMINAL APPROACH    ? NECK SURGERY  11/2021  ? POSTERIOR CERVICAL FUSION/FORAMINOTOMY N/A 12/07/2021  ? Procedure: Cervical Four-Cervical Seven Laminectomies/ Cervical Four -Thoracic One Posterior instrumented spinal fusion;  Surgeon: Judith Part, MD;  Location: [facility];  Service: Neurosurgery;  Laterality: N/A;  Cervical Four-Cervical Seven Laminectomies/ Cervical Four -Thoracic One Posterior instrumented spinal fusion  ? ROBOTIC ASSITED PARTIAL NEPHRECTOMY Left 07/21/2014  ? Procedure: ROBOTIC ASSISTED LEFT PARTIAL NEPHRECTOMY;  Surgeon: Ardis Hughs, MD;  Location: [facility];  Service: Urology;  Laterality: Left;  ? TUBAL LIGATION    ? VENTRAL HERNIA REPAIR N/A 07/21/2014  ? Procedure: INCISIONAL HERNIA REPAIR WITH MESH ;  Surgeon: Coralie Keens, MD;  Location: [facility];  Service: General;  Laterality: N/A;  ? ? ?Social  History ?[name]  reports that she has never smoked. She has never used smokeless tobacco. She reports that she does not currently use alcohol. She reports that she does not use drugs. ? ?family history includes Hypertension in her brother, mother, and sister. ? ?Allergies  ?Allergen Reactions  ? Amlodipine Other (See Comments)  ?  headache  ? Lipitor [Atorvastatin] Other (See Comments)  ?  Muscle aches  ? Bystolic [Nebivolol Hcl] Swelling  ? Codeine Sulfate Hives  ?  REACTION: rash and itching  ? Gabapentin   ?  Severe Headache   ? Latex Rash  ? ? ?  ? ?PHYSICAL EXAMINATION: ? ?Vital signs: BP (!) 214/92   Pulse 62   Temp (!) 96.4 ?F (35.8 ?C)   Resp 17   Ht '5\' 3"'$  (1.6 m)   Wt 167 lb (75.8 kg)   SpO2 100%   BMI 29.58 kg/m?  ?General: Well-developed, well-nourished, no acute distress ?HEENT: Sclerae are anicteric, conjunctiva pink. Oral mucosa intact ?Lungs: Clear ?Heart: Regular ?Abdomen: soft, nontender, nondistended, no obvious ascites, no peritoneal signs, normal bowel sounds. No organomegaly. ?Extremities: No edema ?Psychiatric: alert and oriented x3. Cooperative  ? ? ? ?ASSESSMENT: ? ?Multiple and advanced adenomas ? ? ?PLAN: ? ?Surveillance colonoscopy ? ? ? ? ?  ?

## 2022-03-05 NOTE — Patient Instructions (Signed)
Handouts on Polyps and diverticulosis given. ? ?Await pathology. ? ?YOU HAD AN ENDOSCOPIC PROCEDURE TODAY AT [facility]:   Refer to the procedure report that was given to you for any specific questions about what was found during the examination.  If the procedure report does not answer your questions, please call your gastroenterologist to clarify.  If you requested that your care partner not be given the details of your procedure findings, then the procedure report has been included in a sealed envelope for you to review at your convenience later. ? ?YOU SHOULD EXPECT: Some feelings of bloating in the abdomen. Passage of more gas than usual.  Walking can help get rid of the air that was put into your GI tract during the procedure and reduce the bloating. If you had a lower endoscopy (such as a colonoscopy or flexible sigmoidoscopy) you may notice spotting of blood in your stool or on the toilet paper. If you underwent a bowel prep for your procedure, you may not have a normal bowel movement for a few days. ? ?Please Note:  You might notice some irritation and congestion in your nose or some drainage.  This is from the oxygen used during your procedure.  There is no need for concern and it should clear up in a day or so. ? ?SYMPTOMS TO REPORT IMMEDIATELY: ? ?Following lower endoscopy (colonoscopy or flexible sigmoidoscopy): ? Excessive amounts of blood in the stool ? Significant tenderness or worsening of abdominal pains ? Swelling of the abdomen that is new, acute ? Fever of 100?F or higher ? ?For urgent or emergent issues, a gastroenterologist can be reached at any hour by calling [phone]. ?Do not use MyChart messaging for urgent concerns.  ? ? ?DIET:  We do recommend a small meal at first, but then you may proceed to your regular diet.  Drink plenty of fluids but you should avoid alcoholic beverages for 24 hours. ? ?ACTIVITY:  You should plan to take it easy for the rest of today and you  should NOT DRIVE or use heavy machinery until tomorrow (because of the sedation medicines used during the test).   ? ?FOLLOW UP: ?Our staff will call the number listed on your records 48-72 hours following your procedure to check on you and address any questions or concerns that you may have regarding the information given to you following your procedure. If we do not reach you, we will leave a message.  We will attempt to reach you two times.  During this call, we will ask if you have developed any symptoms of COVID 19. If you develop any symptoms (ie: fever, flu-like symptoms, shortness of breath, cough etc.) before then, please call [phone].  If you test positive for Covid 19 in the 2 weeks post procedure, please call and report this information to [place].   ? ?If any biopsies were taken you will be contacted by phone or by letter within the next 1-3 weeks.  Please call us at [phone] if you have not heard about the biopsies in 3 weeks.  ? ? ?SIGNATURES/CONFIDENTIALITY: ?You and/or your care partner have signed paperwork which will be entered into your electronic medical record.  These signatures attest to the fact that that the information above on your After Visit Summary has been reviewed and is understood.  Full responsibility of the confidentiality of this discharge information lies with you and/or your care-partner.  ?

## 2022-03-05 NOTE — Progress Notes (Signed)
Pt's states no medical or surgical changes since previsit or office visit. 

## 2022-03-05 NOTE — Progress Notes (Signed)
Report to PACU, RN, vss, BBS= Clear.  

## 2022-03-05 NOTE — Progress Notes (Signed)
Called to room to assist during endoscopic procedure.  Patient ID and intended procedure confirmed with present staff. Received instructions for my participation in the procedure from the performing physician.  

## 2022-03-05 NOTE — Op Note (Signed)
[place] ?Patient Name: Teresa Collins ?Procedure Date: 03/05/2022 2:02 PM ?MRN: [id number] ?Endoscopist: Docia Chuck. Henrene Pastor , MD ?Age: 71 ?Referring MD:  ?Date of Birth: 04-27-1951 ?Gender: Female ?Account #: [id number] ?Procedure:                Colonoscopy with cold snare polypectomy x 4 ?Indications:              High risk colon cancer surveillance: Personal  ?                          history of adenoma with villous component, High  ?                          risk colon cancer surveillance: Personal history of  ?                          multiple (3 or more) adenomas ?Medicines:                Monitored Anesthesia Care ?Procedure:                Pre-Anesthesia Assessment: ?                          - Prior to the procedure, a History and Physical  ?                          was performed, and patient medications and  ?                          allergies were reviewed. The patient's tolerance of  ?                          previous anesthesia was also reviewed. The risks  ?                          and benefits of the procedure and the sedation  ?                          options and risks were discussed with the patient.  ?                          All questions were answered, and informed consent  ?                          was obtained. Prior Anticoagulants: The patient has  ?                          taken no previous anticoagulant or antiplatelet  ?                          agents. ASA Grade Assessment: II - A patient with  ?                          mild systemic disease. After reviewing the risks  ?  and benefits, the patient was deemed in  ?                          satisfactory condition to undergo the procedure. ?                          After obtaining informed consent, the colonoscope  ?                          was passed under direct vision. Throughout the  ?                          procedure, the patient's blood pressure, pulse, and  ?                           oxygen saturations were monitored continuously. The  ?                          CF [id number] #[id number] was introduced through the anus  ?                          and advanced to the the cecum, identified by  ?                          appendiceal orifice and ileocecal valve. The  ?                          ileocecal valve, appendiceal orifice, and rectum  ?                          were photographed. The quality of the bowel  ?                          preparation was excellent. The colonoscopy was  ?                          performed without difficulty. The patient tolerated  ?                          the procedure well. The bowel preparation used was  ?                          SUPREP via split dose instruction. ?Scope In: 2:14:47 PM ?Scope Out: 2:30:20 PM ?Scope Withdrawal Time: 0 hours 11 minutes 41 seconds  ?Total Procedure Duration: 0 hours 15 minutes 33 seconds  ?Findings:                 Four polyps were found in the ascending colon and  ?                          cecum. The polyps were 2 to 4 mm in size. These  ?                          polyps were removed with  a cold snare. Resection  ?                          and retrieval were complete. ?                          Multiple small and large-mouthed diverticula were  ?                          found in the left colon. ?                          Internal hemorrhoids were found during retroflexion. ?                          The exam was otherwise without abnormality on  ?                          direct and retroflexion views. ?Complications:            No immediate complications. Estimated blood loss:  ?                          None. ?Estimated Blood Loss:     Estimated blood loss: none. ?Impression:               - Four 2 to 4 mm polyps in the ascending colon and  ?                          in the cecum, removed with a cold snare. Resected  ?                          and retrieved. ?                          - Diverticulosis in the left colon. ?                           - Internal hemorrhoids. ?                          - The examination was otherwise normal on direct  ?                          and retroflexion views. ?Recommendation:           - Repeat colonoscopy in 5 years for surveillance. ?                          - Patient has a contact number available for  ?                          emergencies. The signs and symptoms of potential  ?                          delayed complications were discussed with the  ?  patient. Return to normal activities tomorrow.  ?                          Written discharge instructions were provided to the  ?                          patient. ?                          - Resume previous diet. ?                          - Continue present medications. ?                          - Await pathology results. ?Docia Chuck. Henrene Pastor, MD ?03/05/2022 2:38:27 PM ?This report has been signed electronically. ?

## 2022-03-07 ENCOUNTER — Telehealth: Payer: Self-pay

## 2022-03-07 NOTE — Telephone Encounter (Signed)
?  Follow up Call- ? ? ?  03/05/2022  ?  1:37 PM  ?Call back number  ?Post procedure Call Back phone  # [phone]  ?Permission to leave phone message Yes  ?  ? ?Patient questions: ? ?Do you have a fever, pain , or abdominal swelling? No. ?Pain Score  0 * ? ?Have you tolerated food without any problems? Yes.   ? ?Have you been able to return to your normal activities? Yes.   ? ?Do you have any questions about your discharge instructions: ?Diet   No. ?Medications  No. ?Follow up visit  No. ? ?Do you have questions or concerns about your Care? No. ? ?Actions: ?* If pain score is 4 or above: ?No action needed, pain <4. ? ? ?

## 2022-03-12 ENCOUNTER — Encounter: Payer: Self-pay | Admitting: Internal Medicine

## 2022-03-13 ENCOUNTER — Other Ambulatory Visit: Payer: Self-pay | Admitting: Family Medicine

## 2022-03-13 DIAGNOSIS — Z1231 Encounter for screening mammogram for malignant neoplasm of breast: Secondary | ICD-10-CM

## 2022-04-10 DIAGNOSIS — H02831 Dermatochalasis of right upper eyelid: Secondary | ICD-10-CM | POA: Diagnosis not present

## 2022-04-10 DIAGNOSIS — Z961 Presence of intraocular lens: Secondary | ICD-10-CM | POA: Diagnosis not present

## 2022-04-10 DIAGNOSIS — H11133 Conjunctival pigmentations, bilateral: Secondary | ICD-10-CM | POA: Diagnosis not present

## 2022-04-26 ENCOUNTER — Ambulatory Visit
Admission: RE | Admit: 2022-04-26 | Discharge: 2022-04-26 | Disposition: A | Discharge status: Home / Self Care | Payer: Medicare Other | Source: Ambulatory Visit | Attending: Family Medicine | Admitting: Family Medicine

## 2022-04-26 DIAGNOSIS — Z1231 Encounter for screening mammogram for malignant neoplasm of breast: Secondary | ICD-10-CM | POA: Diagnosis not present

## 2022-05-17 DIAGNOSIS — G9520 Unspecified cord compression: Secondary | ICD-10-CM | POA: Diagnosis not present

## 2022-05-18 DIAGNOSIS — I10 Essential (primary) hypertension: Secondary | ICD-10-CM | POA: Diagnosis not present

## 2022-05-18 DIAGNOSIS — I700 Atherosclerosis of aorta: Secondary | ICD-10-CM | POA: Diagnosis not present

## 2022-05-18 DIAGNOSIS — J302 Other seasonal allergic rhinitis: Secondary | ICD-10-CM | POA: Diagnosis not present

## 2022-05-18 DIAGNOSIS — Z23 Encounter for immunization: Secondary | ICD-10-CM | POA: Diagnosis not present

## 2022-05-18 DIAGNOSIS — E7800 Pure hypercholesterolemia, unspecified: Secondary | ICD-10-CM | POA: Diagnosis not present

## 2022-05-24 DIAGNOSIS — N1831 Chronic kidney disease, stage 3a: Secondary | ICD-10-CM | POA: Diagnosis not present

## 2022-05-29 DIAGNOSIS — N1831 Chronic kidney disease, stage 3a: Secondary | ICD-10-CM | POA: Diagnosis not present

## 2022-05-29 DIAGNOSIS — Z85528 Personal history of other malignant neoplasm of kidney: Secondary | ICD-10-CM | POA: Diagnosis not present

## 2022-05-29 DIAGNOSIS — D631 Anemia in chronic kidney disease: Secondary | ICD-10-CM | POA: Diagnosis not present

## 2022-05-29 DIAGNOSIS — I129 Hypertensive chronic kidney disease with stage 1 through stage 4 chronic kidney disease, or unspecified chronic kidney disease: Secondary | ICD-10-CM | POA: Diagnosis not present

## 2022-05-30 ENCOUNTER — Other Ambulatory Visit: Payer: Self-pay | Admitting: Nephrology

## 2022-05-30 ENCOUNTER — Other Ambulatory Visit (HOSPITAL_COMMUNITY): Payer: Self-pay | Admitting: Nephrology

## 2022-06-06 ENCOUNTER — Emergency Department (HOSPITAL_COMMUNITY)
Admission: EM | Admit: 2022-06-06 | Discharge: 2022-06-06 | Disposition: A | Discharge status: Home / Self Care | Payer: Medicare Other | Attending: Emergency Medicine | Admitting: Emergency Medicine

## 2022-06-06 ENCOUNTER — Other Ambulatory Visit: Payer: Self-pay

## 2022-06-06 ENCOUNTER — Emergency Department (HOSPITAL_COMMUNITY): Payer: Medicare Other

## 2022-06-06 ENCOUNTER — Encounter (HOSPITAL_COMMUNITY): Payer: Self-pay

## 2022-06-06 DIAGNOSIS — R42 Dizziness and giddiness: Secondary | ICD-10-CM | POA: Diagnosis not present

## 2022-06-06 DIAGNOSIS — U071 COVID-19: Secondary | ICD-10-CM | POA: Diagnosis not present

## 2022-06-06 DIAGNOSIS — Z91040 Latex allergy status: Secondary | ICD-10-CM | POA: Diagnosis not present

## 2022-06-06 DIAGNOSIS — R112 Nausea with vomiting, unspecified: Secondary | ICD-10-CM | POA: Diagnosis present

## 2022-06-06 LAB — URINALYSIS, ROUTINE W REFLEX MICROSCOPIC
Bilirubin Urine: NEGATIVE
Glucose, UA: NEGATIVE mg/dL
Ketones, ur: 5 mg/dL — AB
Leukocytes,Ua: NEGATIVE
Nitrite: NEGATIVE
Protein, ur: 100 mg/dL — AB
Specific Gravity, Urine: 1.015 (ref 1.005–1.030)
pH: 5 (ref 5.0–8.0)

## 2022-06-06 LAB — CBC WITH DIFFERENTIAL/PLATELET
Abs Immature Granulocytes: 0.01 10*3/uL (ref 0.00–0.07)
Basophils Absolute: 0 10*3/uL (ref 0.0–0.1)
Basophils Relative: 1 %
Eosinophils Absolute: 0 10*3/uL (ref 0.0–0.5)
Eosinophils Relative: 0 %
HCT: 43.2 % (ref 36.0–46.0)
Hemoglobin: 14.3 g/dL (ref 12.0–15.0)
Immature Granulocytes: 0 %
Lymphocytes Relative: 14 %
Lymphs Abs: 0.5 10*3/uL — ABNORMAL LOW (ref 0.7–4.0)
MCH: 30 pg (ref 26.0–34.0)
MCHC: 33.1 g/dL (ref 30.0–36.0)
MCV: 90.6 fL (ref 80.0–100.0)
Monocytes Absolute: 0.7 10*3/uL (ref 0.1–1.0)
Monocytes Relative: 19 %
Neutro Abs: 2.6 10*3/uL (ref 1.7–7.7)
Neutrophils Relative %: 66 %
Platelets: 104 10*3/uL — ABNORMAL LOW (ref 150–400)
RBC: 4.77 MIL/uL (ref 3.87–5.11)
RDW: 14 % (ref 11.5–15.5)
WBC: 3.9 10*3/uL — ABNORMAL LOW (ref 4.0–10.5)
nRBC: 0 % (ref 0.0–0.2)

## 2022-06-06 LAB — COMPREHENSIVE METABOLIC PANEL
ALT: 18 U/L (ref 0–44)
AST: 25 U/L (ref 15–41)
Albumin: 4 g/dL (ref 3.5–5.0)
Alkaline Phosphatase: 64 U/L (ref 38–126)
Anion gap: 11 (ref 5–15)
BUN: 18 mg/dL (ref 8–23)
CO2: 24 mmol/L (ref 22–32)
Calcium: 10.1 mg/dL (ref 8.9–10.3)
Chloride: 104 mmol/L (ref 98–111)
Creatinine, Ser: 1.44 mg/dL — ABNORMAL HIGH (ref 0.44–1.00)
GFR, Estimated: 39 mL/min — ABNORMAL LOW (ref >60)
Glucose, Bld: 113 mg/dL — ABNORMAL HIGH (ref 70–99)
Potassium: 4.2 mmol/L (ref 3.5–5.1)
Sodium: 139 mmol/L (ref 135–145)
Total Bilirubin: 0.7 mg/dL (ref 0.3–1.2)
Total Protein: 7.9 g/dL (ref 6.5–8.1)

## 2022-06-06 LAB — MAGNESIUM: Magnesium: 2.1 mg/dL (ref 1.7–2.4)

## 2022-06-06 LAB — SARS CORONAVIRUS 2 BY RT PCR: SARS Coronavirus 2 by RT PCR: POSITIVE — AB

## 2022-06-06 LAB — TROPONIN I (HIGH SENSITIVITY): Troponin I (High Sensitivity): 5 ng/L (ref <18)

## 2022-06-06 MED ORDER — SODIUM CHLORIDE 0.9 % IV BOLUS
1000.0000 mL | Freq: Once | INTRAVENOUS | Status: AC
  Administered 2022-06-06: 1000 mL via INTRAVENOUS

## 2022-06-06 MED ORDER — MECLIZINE HCL 25 MG PO TABS
25.0000 mg | ORAL_TABLET | Freq: Once | ORAL | Status: AC
Start: 2022-06-06 — End: 2022-06-06
  Administered 2022-06-06: 25 mg via ORAL
  Filled 2022-06-06: qty 1

## 2022-06-06 MED ORDER — MOLNUPIRAVIR 200 MG PO CAPS
4.0000 | ORAL_CAPSULE | Freq: Two times a day (BID) | ORAL | 0 refills | Status: AC
Start: 2022-06-06 — End: 2022-06-11

## 2022-06-06 MED ORDER — NIRMATRELVIR/RITONAVIR (PAXLOVID)TABLET: 3.0000 | ORAL_TABLET | Freq: Two times a day (BID) | ORAL | 0 refills | Status: AC

## 2022-06-06 MED ORDER — ONDANSETRON HCL 4 MG/2ML IJ SOLN
4.0000 mg | Freq: Once | INTRAMUSCULAR | Status: AC
  Administered 2022-06-06: 4 mg via INTRAVENOUS
  Filled 2022-06-06: qty 2

## 2022-06-06 NOTE — Discharge Instructions (Signed)
You are COVID-positive today.  This is likely the cause of your symptoms.  You should quarantine for 10 days from the start of your symptoms.  Keep yourself hydrated by drinking plenty of water.  Use over-the-counter cold and flu medicine as needed for fevers, aches and chills.  Most people have COVID symptoms for 5 to 10 days.

## 2022-06-06 NOTE — ED Provider Notes (Signed)
Teresa Collins   CSN: [id number] Arrival date & time: 06/06/22  1030     History  Chief Complaint  Patient presents with   Dizziness   Nausea   Emesis   Diarrhea    Teresa Collins is a 71 y.o. female presented ED with dizziness, fatigue, lightheadedness.  The patient reports that she has been feeling ill for 3 days, with what began as a "head cold" on Monday.  She says she has been fatigued the past 2 days.  Today she was very lightheaded, extremely fatigued, felt nauseated and vomited several times, had some diarrhea for the past few hours.  She briefly had a loss of consciousness or near syncope at home witnessed by her boyfriend who is also present here.  She reports she has been combination of vertigo, which she has had in the past, as well as lightheadedness.  She says she has been dealing with episodes like this for nearly 7 years.  They seem to come at random and can often leave her feeling fatigued, nauseous and vomiting.  She had a CT dissection study performed December of last year while in the emergency department which showed no any some, no significant vascular abnormalities,, no other emergent or significant findings.  No enlarged lymph nodes.  She does have a history of a C4-C7 laminectomy for cervical myelopathy.  HPI     Home Medications Prior to Admission medications   Medication Sig Start Date End Date Taking? Authorizing Provider  molnupiravir EUA (LAGEVRIO) 200 MG CAPS capsule Take 4 capsules (800 mg total) by mouth 2 (two) times daily for 5 days. 06/06/22 06/11/22 Yes Nagee Goates, Carola Rhine, MD  acetaminophen (TYLENOL) 500 MG tablet Take 1,000 mg by mouth every 6 (six) hours as needed for headache. Patient not taking: Reported on 02/12/2022    [provider]  aspirin 81 MG chewable tablet Chew by mouth daily.    [provider]  clobetasol ointment (TEMOVATE) 7.35 % 1 application 2 (two) times daily  as needed (eczema). 11/19/21   [provider]  Cyanocobalamin (VITAMIN B 12) 500 MCG TABS Take 500 mcg by mouth daily.    [provider]  fluticasone (FLONASE) 50 MCG/ACT nasal spray Place 1 spray into both nostrils daily. 06/28/20   [provider]  levocetirizine (XYZAL) 5 MG tablet Take 5 mg by mouth every evening.    [provider]  lisinopril (ZESTRIL) 40 MG tablet Take 40 mg by mouth daily. 09/04/21   [provider]  methocarbamol (ROBAXIN) 500 MG tablet Take 1 tablet (500 mg total) by mouth 2 (two) times daily. Patient not taking: Reported on 02/12/2022 09/21/21   Eustaquio Maize, PA-C  nystatin (MYCOSTATIN/NYSTOP) powder Apply 1 application topically 2 (two) times daily as needed (irritation). 08/20/21   [provider]  rosuvastatin (CRESTOR) 20 MG tablet Take 20 mg by mouth at bedtime. 01/12/21   [provider]      Allergies    Amlodipine, Lipitor [atorvastatin], Bystolic [nebivolol hcl], Codeine sulfate, Gabapentin, and Latex    Review of Systems   Review of Systems  Physical Exam Updated Vital Signs BP 117/79   Pulse 91   Temp 99 F (37.2 C) (Oral)   Resp 16   Ht '5\' 2"'$  (1.575 m)   Wt 74.4 kg   SpO2 99%   BMI 30.00 kg/m  Physical Exam Constitutional:      General: She is not in acute distress.  Comments: Lying on bed with eyes closed  HENT:     Head: Normocephalic and atraumatic.  Eyes:     Conjunctiva/sclera: Conjunctivae normal.     Pupils: Pupils are equal, round, and reactive to light.  Cardiovascular:     Rate and Rhythm: Normal rate and regular rhythm.  Pulmonary:     Effort: Pulmonary effort is normal. No respiratory distress.     Breath sounds: Normal breath sounds.  Abdominal:     General: There is no distension.     Tenderness: There is no abdominal tenderness.  Skin:    General: Skin is warm and dry.  Neurological:     General: No focal deficit present.     Mental Status: She is  alert. Mental status is at baseline.  Psychiatric:        Mood and Affect: Mood normal.        Behavior: Behavior normal.     ED Results / Procedures / Treatments   Labs (all labs ordered are listed, but only abnormal results are displayed) Labs Reviewed  SARS CORONAVIRUS 2 BY RT PCR - Abnormal; Notable for the following components:      Result Value   SARS Coronavirus 2 by RT PCR POSITIVE (*)    All other components within normal limits  CBC WITH DIFFERENTIAL/PLATELET - Abnormal; Notable for the following components:   WBC 3.9 (*)    Platelets 104 (*)    Lymphs Abs 0.5 (*)    All other components within normal limits  COMPREHENSIVE METABOLIC PANEL - Abnormal; Notable for the following components:   Glucose, Bld 113 (*)    Creatinine, Ser 1.44 (*)    GFR, Estimated 39 (*)    All other components within normal limits  URINALYSIS, ROUTINE W REFLEX MICROSCOPIC - Abnormal; Notable for the following components:   APPearance HAZY (*)    Hgb urine dipstick MODERATE (*)    Ketones, ur 5 (*)    Protein, ur 100 (*)    Bacteria, UA RARE (*)    All other components within normal limits  MAGNESIUM  TROPONIN I (HIGH SENSITIVITY)    EKG EKG Interpretation  Date/Time:  Wednesday June 06 2022 10:51:25 EDT Ventricular Rate:  87 PR Interval:  104 QRS Duration: 100 QT Interval:  347 QTC Calculation: 418 R Axis:   86 Text Interpretation: Sinus rhythm Short PR interval Right atrial enlargement Borderline right axis deviation Confirmed by Octaviano Glow [phone]) on 06/06/2022 11:28:47 AM  Radiology DG Chest 2 View  Result Date: 06/06/2022 CLINICAL DATA:  dizziness EXAM: CHEST - 2 VIEW COMPARISON:  Chest x-ray December 09, 2020. FINDINGS: No consolidation. No visible pleural effusions or pneumothorax. Right-sided chest fold. Cardiomediastinal silhouette is within normal limits. No acute osseous abnormality. Partially imaged cervical fusion hardware. IMPRESSION: No evidence of acute  cardiopulmonary disease. Electronically Signed   By: Margaretha Sheffield M.D.   On: 06/06/2022 12:08    Procedures Procedures    Medications Ordered in ED Medications  sodium chloride 0.9 % bolus 1,000 mL (1,000 mLs Intravenous New Bag/Given 06/06/22 1213)  ondansetron (ZOFRAN) injection 4 mg (4 mg Intravenous Given 06/06/22 1207)  meclizine (ANTIVERT) tablet 25 mg (25 mg Oral Given 06/06/22 1213)    ED Course/ Medical Decision Making/ A&P Clinical Course as of 06/06/22 1306  Wed Jun 06, 2022  1249 SARS Coronavirus 2 by RT PCR(!): POSITIVE [MT]    Clinical Course User Index [MT] Mirah Nevins, Carola Rhine, MD  Medical Decision Making Amount and/or Complexity of Data Reviewed Labs: ordered. Decision-making details documented in ED Course.  Risk Prescription drug management.   This patient presents to the ED with concern for nausea, vomiting, dizziness, fatigue. This involves an extensive number of treatment options, and is a complaint that carries with it a high risk of complications and morbidity.  The differential diagnosis includes viral URI versus anemia versus electrolyte derangement versus atypica ACS versus other  I am not certain what to make of the fact the symptoms been ongoing intermittently for several months or years.  It seems to suggest that this is less likely an acute emergent process, more of a chronic condition.  She also appears to have some labored breathing on exam and says she feels short of breath "only when I am talking".  She is not hypoxic.  COVID would be in the differential.  Additional history obtained from boyfriend at bedside  External records from outside source obtained and reviewed including neurosurgery record and CT dissection study from December  I ordered and personally interpreted labs.  The pertinent results include: No emergent findings  I ordered imaging studies including x-ray of the chest I independently visualized and  interpreted imaging which showed no significant findings I agree with the radiologist interpretation  The patient was maintained on a cardiac monitor.  I personally viewed and interpreted the cardiac monitored which showed an underlying rhythm of: Sinus rhythm  Per my interpretation the patient's ECG shows sinus rhythm with no acute ischemic findings  I ordered medication including IV fluids, IV Zofran, meclizine for vertigo, nausea and hydration.  I have reviewed the patients home medicines and have made adjustments as needed  Test Considered: Lower suspicion for acute PE at this time with no tachycardia, symptoms intermittent for several years.  After the interventions noted above, I reevaluated the patient and found that they have: stayed the same  Patient is positive for COVID-19.  Given her age and medical comorbidities she is a candidate for antiviral therapy and this will be prescribed.  She is advised to pick up and begin it today.  Okay for discharge.  Teresa Collins was evaluated in Emergency Department on 06/06/2022 for the symptoms described in the history of present illness. She was evaluated in the context of the global COVID-19 pandemic, which necessitated consideration that the patient might be at risk for infection with the SARS-CoV-2 virus that causes COVID-19. Institutional protocols and algorithms that pertain to the evaluation of patients at risk for COVID-19 are in a state of rapid change based on information released by regulatory bodies including the CDC and federal and state organizations. These policies and algorithms were followed during the patient's care in the ED.    Dispostion:  After consideration of the diagnostic results and the patients response to treatment, I feel that the patent would benefit from outpatient PCP follow-up..         Final Clinical Impression(s) / ED Diagnoses Final diagnoses:  COVID-19    Rx / DC Orders ED Discharge Orders           Ordered    molnupiravir EUA (LAGEVRIO) 200 MG CAPS capsule  2 times daily        06/06/22 1305              Wyvonnia Dusky, MD 06/06/22 1306

## 2022-06-06 NOTE — ED Provider Notes (Signed)
Patient's pharmacy called reporting they do not have molnupiravir in stock but they do have Paxlovid.  tHIS Alternative antiviral medication was prescribed.   Wyvonnia Dusky, MD 06/06/22 [phone]

## 2022-06-06 NOTE — ED Provider Triage Note (Signed)
Emergency Medicine Provider Triage Evaluation Note  Teresa Collins , a 71 y.o. female  was evaluated in triage.  Pt complains of nausea, vomiting and diarrhea x 4 hours.  This has been ongoing for some time, recurrent every 6 months.  Feels that today she is unable to feel better on her own.  Did have 1 episode of nausea and vomiting, endorsing some abdominal pain along with chest pain.  Feels that the room is completely spinning after she tried taking a shower.  Review of Systems  Positive: Dizzy, nausea, vomiting Negative: Fever, cough  Physical Exam  BP 117/79   Pulse 91   Temp 99 F (37.2 C) (Oral)   Resp 16   Ht '5\' 2"'$  (1.575 m)   Wt 74.4 kg   SpO2 99%   BMI 30.00 kg/m  Gen:   Awake, no distress   Resp:  Normal effort  MSK:   Moves extremities without difficulty  Other:    Medical Decision Making  Medically screening exam initiated at 11:16 AM.  Appropriate orders placed.  Teresa Collins was informed that the remainder of the evaluation will be completed by another provider, this initial triage assessment does not replace that evaluation, and the importance of remaining in the ED until their evaluation is complete.     [name], PA-C 06/06/22 1120

## 2022-06-06 NOTE — ED Triage Notes (Signed)
Pt c/o dizziness, nausea, and vomiting since Monday. Pt reports after she took a shower this morning, she had a brief loss of consciousness. Pt was able to sit in a chair before she passed out.

## 2022-06-08 ENCOUNTER — Encounter (HOSPITAL_COMMUNITY): Payer: Medicare Other

## 2022-06-08 ENCOUNTER — Encounter (HOSPITAL_COMMUNITY): Payer: Self-pay

## 2022-06-08 ENCOUNTER — Encounter (HOSPITAL_COMMUNITY): Payer: Medicare Other | Attending: Nephrology

## 2022-06-08 DIAGNOSIS — U071 COVID-19: Secondary | ICD-10-CM | POA: Diagnosis not present

## 2022-07-11 ENCOUNTER — Ambulatory Visit (HOSPITAL_COMMUNITY)
Admission: RE | Admit: 2022-07-11 | Discharge: 2022-07-11 | Disposition: A | Discharge status: Home / Self Care | Payer: Medicare Other | Source: Ambulatory Visit | Attending: Nephrology | Admitting: Nephrology

## 2022-07-11 DIAGNOSIS — E041 Nontoxic single thyroid nodule: Secondary | ICD-10-CM | POA: Diagnosis not present

## 2022-07-11 MED ORDER — TECHNETIUM TC 99M SESTAMIBI GENERIC - CARDIOLITE
25.4000 | Freq: Once | INTRAVENOUS | Status: AC | PRN
  Administered 2022-07-11: 25.4 via INTRAVENOUS

## 2022-08-08 DIAGNOSIS — D351 Benign neoplasm of parathyroid gland: Secondary | ICD-10-CM | POA: Diagnosis not present

## 2022-08-13 ENCOUNTER — Other Ambulatory Visit: Payer: Self-pay | Admitting: Surgery

## 2022-08-13 DIAGNOSIS — D351 Benign neoplasm of parathyroid gland: Secondary | ICD-10-CM

## 2022-08-16 ENCOUNTER — Ambulatory Visit
Admission: RE | Admit: 2022-08-16 | Discharge: 2022-08-16 | Disposition: A | Discharge status: Home / Self Care | Payer: Medicare Other | Source: Ambulatory Visit | Attending: Surgery | Admitting: Surgery

## 2022-08-16 DIAGNOSIS — D351 Benign neoplasm of parathyroid gland: Secondary | ICD-10-CM

## 2022-08-16 DIAGNOSIS — E042 Nontoxic multinodular goiter: Secondary | ICD-10-CM | POA: Diagnosis not present

## 2022-09-06 DIAGNOSIS — E7800 Pure hypercholesterolemia, unspecified: Secondary | ICD-10-CM | POA: Diagnosis not present

## 2022-09-06 DIAGNOSIS — E538 Deficiency of other specified B group vitamins: Secondary | ICD-10-CM | POA: Diagnosis not present

## 2022-09-06 DIAGNOSIS — Z0000 Encounter for general adult medical examination without abnormal findings: Secondary | ICD-10-CM | POA: Diagnosis not present

## 2022-09-06 DIAGNOSIS — D696 Thrombocytopenia, unspecified: Secondary | ICD-10-CM | POA: Diagnosis not present

## 2022-09-06 DIAGNOSIS — Z23 Encounter for immunization: Secondary | ICD-10-CM | POA: Diagnosis not present

## 2022-09-06 DIAGNOSIS — I10 Essential (primary) hypertension: Secondary | ICD-10-CM | POA: Diagnosis not present

## 2022-09-27 DIAGNOSIS — E042 Nontoxic multinodular goiter: Secondary | ICD-10-CM | POA: Diagnosis not present

## 2022-09-27 DIAGNOSIS — D351 Benign neoplasm of parathyroid gland: Secondary | ICD-10-CM | POA: Diagnosis not present

## 2022-10-23 DIAGNOSIS — D351 Benign neoplasm of parathyroid gland: Secondary | ICD-10-CM | POA: Diagnosis not present

## 2022-10-31 DIAGNOSIS — M4322 Fusion of spine, cervical region: Secondary | ICD-10-CM | POA: Diagnosis not present

## 2022-12-04 ENCOUNTER — Emergency Department (HOSPITAL_COMMUNITY)
Admission: EM | Admit: 2022-12-04 | Discharge: 2022-12-04 | Disposition: A | Discharge status: Home / Self Care | Payer: 59 | Attending: Emergency Medicine | Admitting: Emergency Medicine

## 2022-12-04 ENCOUNTER — Encounter (HOSPITAL_COMMUNITY): Payer: Self-pay | Admitting: Emergency Medicine

## 2022-12-04 ENCOUNTER — Emergency Department (HOSPITAL_COMMUNITY): Payer: 59

## 2022-12-04 ENCOUNTER — Other Ambulatory Visit: Payer: Self-pay

## 2022-12-04 DIAGNOSIS — I10 Essential (primary) hypertension: Secondary | ICD-10-CM | POA: Diagnosis not present

## 2022-12-04 DIAGNOSIS — Z79899 Other long term (current) drug therapy: Secondary | ICD-10-CM | POA: Insufficient documentation

## 2022-12-04 DIAGNOSIS — W0110XA Fall on same level from slipping, tripping and stumbling with subsequent striking against unspecified object, initial encounter: Secondary | ICD-10-CM | POA: Diagnosis not present

## 2022-12-04 DIAGNOSIS — R55 Syncope and collapse: Secondary | ICD-10-CM

## 2022-12-04 DIAGNOSIS — Z91040 Latex allergy status: Secondary | ICD-10-CM | POA: Insufficient documentation

## 2022-12-04 DIAGNOSIS — Z7982 Long term (current) use of aspirin: Secondary | ICD-10-CM | POA: Diagnosis not present

## 2022-12-04 DIAGNOSIS — Y92000 Kitchen of unspecified non-institutional (private) residence as  the place of occurrence of the external cause: Secondary | ICD-10-CM | POA: Diagnosis not present

## 2022-12-04 DIAGNOSIS — S060X1A Concussion with loss of consciousness of 30 minutes or less, initial encounter: Secondary | ICD-10-CM | POA: Diagnosis not present

## 2022-12-04 DIAGNOSIS — Y9301 Activity, walking, marching and hiking: Secondary | ICD-10-CM | POA: Diagnosis not present

## 2022-12-04 DIAGNOSIS — R42 Dizziness and giddiness: Secondary | ICD-10-CM | POA: Diagnosis not present

## 2022-12-04 LAB — BASIC METABOLIC PANEL
Anion gap: 6 (ref 5–15)
BUN: 20 mg/dL (ref 8–23)
CO2: 29 mmol/L (ref 22–32)
Calcium: 10.2 mg/dL (ref 8.9–10.3)
Chloride: 106 mmol/L (ref 98–111)
Creatinine, Ser: 1.34 mg/dL — ABNORMAL HIGH (ref 0.44–1.00)
GFR, Estimated: 43 mL/min — ABNORMAL LOW (ref >60)
Glucose, Bld: 96 mg/dL (ref 70–99)
Potassium: 4.6 mmol/L (ref 3.5–5.1)
Sodium: 141 mmol/L (ref 135–145)

## 2022-12-04 LAB — CBC WITH DIFFERENTIAL/PLATELET
Abs Immature Granulocytes: 0.02 10*3/uL (ref 0.00–0.07)
Basophils Absolute: 0 10*3/uL (ref 0.0–0.1)
Basophils Relative: 1 %
Eosinophils Absolute: 0.1 10*3/uL (ref 0.0–0.5)
Eosinophils Relative: 2 %
HCT: 43.6 % (ref 36.0–46.0)
Hemoglobin: 13.9 g/dL (ref 12.0–15.0)
Immature Granulocytes: 0 %
Lymphocytes Relative: 36 %
Lymphs Abs: 1.7 10*3/uL (ref 0.7–4.0)
MCH: 30 pg (ref 26.0–34.0)
MCHC: 31.9 g/dL (ref 30.0–36.0)
MCV: 94 fL (ref 80.0–100.0)
Monocytes Absolute: 0.6 10*3/uL (ref 0.1–1.0)
Monocytes Relative: 12 %
Neutro Abs: 2.4 10*3/uL (ref 1.7–7.7)
Neutrophils Relative %: 49 %
Platelets: 175 10*3/uL (ref 150–400)
RBC: 4.64 MIL/uL (ref 3.87–5.11)
RDW: 13.5 % (ref 11.5–15.5)
WBC: 4.9 10*3/uL (ref 4.0–10.5)
nRBC: 0 % (ref 0.0–0.2)

## 2022-12-04 NOTE — ED Provider Notes (Signed)
[facility] Provider Note   CSN: [id number] Arrival date & time: 12/04/22  1119     History  Chief Complaint  Patient presents with   Loss of Consciousness    Teresa Collins is a 72 y.o. female.  Patient is a 72 year old female with a history of hypertension, hyperlipidemia, prior syncope due to orthostatic hypotension who is presenting today with complaints of headache, dizziness and blurred vision.  Patient reports on Saturday they had gone out to eat and she had come home and she was having some stomach discomfort so she remembers sitting down but then reports she must of gotten up to do something and husband heard a crash.  He went into the kitchen and she was lying on the floor unconscious for approximately 1 to 2 minutes.  At the time she was sweaty and vomited once waking up.  Since that time she reports she has had intermittent headaches, felt off balance with walking and felt blurred vision.  Occasionally when she is watching TV it will look like the words are doubled next to each other but then sometimes also doubled on top of each other.  She denies any weakness in either extremity upper or lower.  She has not had any neck pain.  She does not take any anticoagulation.  She denies any chest pain or shortness of breath.  She does take lisinopril but has not changed any medications recently.  She does report prior syncopal events with standing and during the events her blood pressure is always low.  She is followed up with her doctor about this and she feels that the episode she had on Saturday is similar to prior events.  The history is provided by the patient and the spouse.  Loss of Consciousness      Home Medications Prior to Admission medications   Medication Sig Start Date End Date Taking? Authorizing Provider  acetaminophen (TYLENOL) 500 MG tablet Take 1,000 mg by mouth every 6 (six) hours as needed for headache. Patient  not taking: Reported on 02/12/2022    [provider]  aspirin 81 MG chewable tablet Chew by mouth daily.    [provider]  clobetasol ointment (TEMOVATE) [id number] % 1 application 2 (two) times daily as needed (eczema). 11/19/21   [provider]  Cyanocobalamin (VITAMIN B 12) 500 MCG TABS Take 500 mcg by mouth daily.    [provider]  fluticasone (FLONASE) 50 MCG/ACT nasal spray Place 1 spray into both nostrils daily. 06/28/20   [provider]  levocetirizine (XYZAL) 5 MG tablet Take 5 mg by mouth every evening.    [provider]  lisinopril (ZESTRIL) 40 MG tablet Take 40 mg by mouth daily. 09/04/21   [provider]  methocarbamol (ROBAXIN) 500 MG tablet Take 1 tablet (500 mg total) by mouth 2 (two) times daily. Patient not taking: Reported on 02/12/2022 09/21/21   Eustaquio Maize, PA-C  nystatin (MYCOSTATIN/NYSTOP) powder Apply 1 application topically 2 (two) times daily as needed (irritation). 08/20/21   [provider]  rosuvastatin (CRESTOR) 20 MG tablet Take 20 mg by mouth at bedtime. 01/12/21   [provider]      Allergies    Amlodipine, Lipitor [atorvastatin], Bystolic [nebivolol hcl], Codeine sulfate, Gabapentin, and Latex    Review of Systems   Review of Systems  Cardiovascular:  Positive for syncope.    Physical Exam Updated Vital Signs BP (!) 146/70   Pulse Marland Kitchen)  44   Temp 97.9 F (36.6 C) (Oral)   Resp 14   Ht 5' 2"$  (1.575 m)   Wt 74.4 kg   SpO2 99%   BMI 30.00 kg/m  Physical Exam Vitals and nursing note reviewed.  Constitutional:      General: She is not in acute distress.    Appearance: She is well-developed.  HENT:     Head: Normocephalic and atraumatic.      Right Ear: Tympanic membrane normal.     Left Ear: Tympanic membrane normal.  Eyes:     General: No visual field deficit.    Extraocular Movements: Extraocular movements intact.     Conjunctiva/sclera: Conjunctivae normal.      Pupils: Pupils are equal, round, and reactive to light.  Cardiovascular:     Rate and Rhythm: Normal rate and regular rhythm.     Heart sounds: Normal heart sounds. No murmur heard.    No friction rub.  Pulmonary:     Effort: Pulmonary effort is normal.     Breath sounds: Normal breath sounds. No wheezing or rales.  Abdominal:     General: Bowel sounds are normal. There is no distension.     Palpations: Abdomen is soft.     Tenderness: There is no abdominal tenderness. There is no guarding or rebound.  Musculoskeletal:        General: No tenderness. Normal range of motion.     Cervical back: Normal range of motion and neck supple. No tenderness.     Comments: No edema  Skin:    General: Skin is warm and dry.     Findings: No rash.  Neurological:     Mental Status: She is alert and oriented to person, place, and time.     Cranial Nerves: No cranial nerve deficit, dysarthria or facial asymmetry.     Sensory: Sensation is intact. No sensory deficit.     Motor: Motor function is intact. No weakness or pronator drift.     Coordination: Coordination is intact. Coordination normal. Finger-Nose-Finger Test and Heel to [facility] Test normal.  Psychiatric:        Behavior: Behavior normal.     ED Results / Procedures / Treatments   Labs (all labs ordered are listed, but only abnormal results are displayed) Labs Reviewed  BASIC METABOLIC PANEL - Abnormal; Notable for the following components:      Result Value   Creatinine, Ser 1.34 (*)    GFR, Estimated 43 (*)    All other components within normal limits  CBC WITH DIFFERENTIAL/PLATELET    EKG EKG Interpretation  Date/Time:  Tuesday December 04 2022 11:28:42 EST Ventricular Rate:  53 PR Interval:  98 QRS Duration: 102 QT Interval:  410 QTC Calculation: 385 R Axis:   80 Text Interpretation: Sinus rhythm Short PR interval Low voltage, precordial leads No significant change since last tracing Confirmed by Blanchie Dessert  [phone]) on 12/04/2022 12:20:29 PM  Radiology CT Head Wo Contrast  Result Date: 12/04/2022 CLINICAL DATA:  Fall, dizziness EXAM: CT HEAD WITHOUT CONTRAST TECHNIQUE: Contiguous axial images were obtained from the base of the skull through the vertex without intravenous contrast. RADIATION DOSE REDUCTION: This exam was performed according to the departmental dose-optimization program which includes automated exposure control, adjustment of the mA and/or kV according to patient size and/or use of iterative reconstruction technique. COMPARISON:  09/12/2021 FINDINGS: Brain: No acute intracranial abnormality. Specifically, no hemorrhage, hydrocephalus, mass lesion, acute infarction, or significant intracranial injury. Vascular: No  hyperdense vessel or unexpected calcification. Skull: No acute calvarial abnormality. Sinuses/Orbits: No acute findings Other: None IMPRESSION: No acute intracranial abnormality. Electronically Signed   By: Rolm Baptise M.D.   On: 12/04/2022 13:54    Procedures Procedures    Medications Ordered in ED Medications - No data to display  ED Course/ Medical Decision Making/ A&P                             Medical Decision Making Amount and/or Complexity of Data Reviewed Independent Historian: spouse Labs: ordered. Decision-making details documented in ED Course. Radiology: ordered and independent interpretation performed. Decision-making details documented in ED Course. ECG/medicine tests: ordered and independent interpretation performed. Decision-making details documented in ED Course.   Pt with multiple medical problems and comorbidities and presenting today with a complaint that caries a high risk for morbidity and mortality.  Here today with persistent dizziness, headaches and feeling off balance with walking.  This is after she had a syncopal event on Saturday falling and hitting her head.  Syncope most likely related to vasovagal event as she was having some stomach  discomfort prior to the event.  She has prior history of similar.  Concern for possible intracranial hemorrhage versus concussion today.  Low suspicion for vertebral dissection or cervical abnormalities.  Patient's neuroexam is intact.  No evidence of cranial nerve palsies or nystagmus on exam.  Vital signs are reassuring.  I independently interpreted patient's labs and EKG.  Patient's EKG does have a shortened PR but it is unchanged from prior EKGs.  There is no significant findings.  CBC and BMP without acute findings. I have independently visualized and interpreted pt's images today.  Head CT today without signs of bleeding.  Findings discussed with the patient.  Suspect concussion.  At this time patient is able to ambulate and does not have social barriers affecting her discharge.  Discussed with she and her husband to follow-up with the concussion clinic.  Also gave return precautions.          Final Clinical Impression(s) / ED Diagnoses Final diagnoses:  Concussion with loss of consciousness of 30 minutes or less, initial encounter  Vasovagal syncope    Rx / DC Orders ED Discharge Orders     None         Blanchie Dessert, MD 12/04/22 1433

## 2022-12-04 NOTE — ED Notes (Signed)
Dc instructions reviewed with pt no questions or concerns at this time. Pt declined wheelchair and ambulated with steady gait out of ED with spouse

## 2022-12-04 NOTE — ED Notes (Signed)
Patient transported to CT 

## 2022-12-04 NOTE — Discharge Instructions (Addendum)
The blood work and Scan look normal.  You have a concussion.  Make sure you are staying hydrated and avoiding exertional activity.  Avoid reading fine print.  If you develop vomiting, confusion return to the emergency room immediately.

## 2022-12-04 NOTE — ED Triage Notes (Signed)
Patient arrives ambulatory by POV c/o dizziness, blurred vision and right sided headache. Patient states Saturday night she was sitting in the living room and had a syncopal episode. States she thinks she hit her head but not sure what she hit it on. Denies any blood thinners. Patient reports having numbness in left 3 fingers since neck surgery last year but no new neuro deficits.

## 2022-12-06 DIAGNOSIS — I129 Hypertensive chronic kidney disease with stage 1 through stage 4 chronic kidney disease, or unspecified chronic kidney disease: Secondary | ICD-10-CM | POA: Diagnosis not present

## 2022-12-06 DIAGNOSIS — D351 Benign neoplasm of parathyroid gland: Secondary | ICD-10-CM | POA: Diagnosis not present

## 2022-12-06 DIAGNOSIS — D631 Anemia in chronic kidney disease: Secondary | ICD-10-CM | POA: Diagnosis not present

## 2022-12-06 DIAGNOSIS — N1831 Chronic kidney disease, stage 3a: Secondary | ICD-10-CM | POA: Diagnosis not present

## 2022-12-06 DIAGNOSIS — Z85528 Personal history of other malignant neoplasm of kidney: Secondary | ICD-10-CM | POA: Diagnosis not present

## 2022-12-06 IMAGING — RF DG CERVICAL SPINE 1V
1 series · 1 of 1 positions shown · non-contrast
Comparison: None.

CLINICAL DATA: Surgical fusion of cervical spine.

EXAM:
DG CERVICAL SPINE - 1 VIEW; DG C-ARM 1-60 MIN-NO REPORT
Radiation exposure index: 0.966 mGy.

[Series 1: dg x-ray · 0.14mm/px · 1 of 1 slices shown]
[im 1/1]
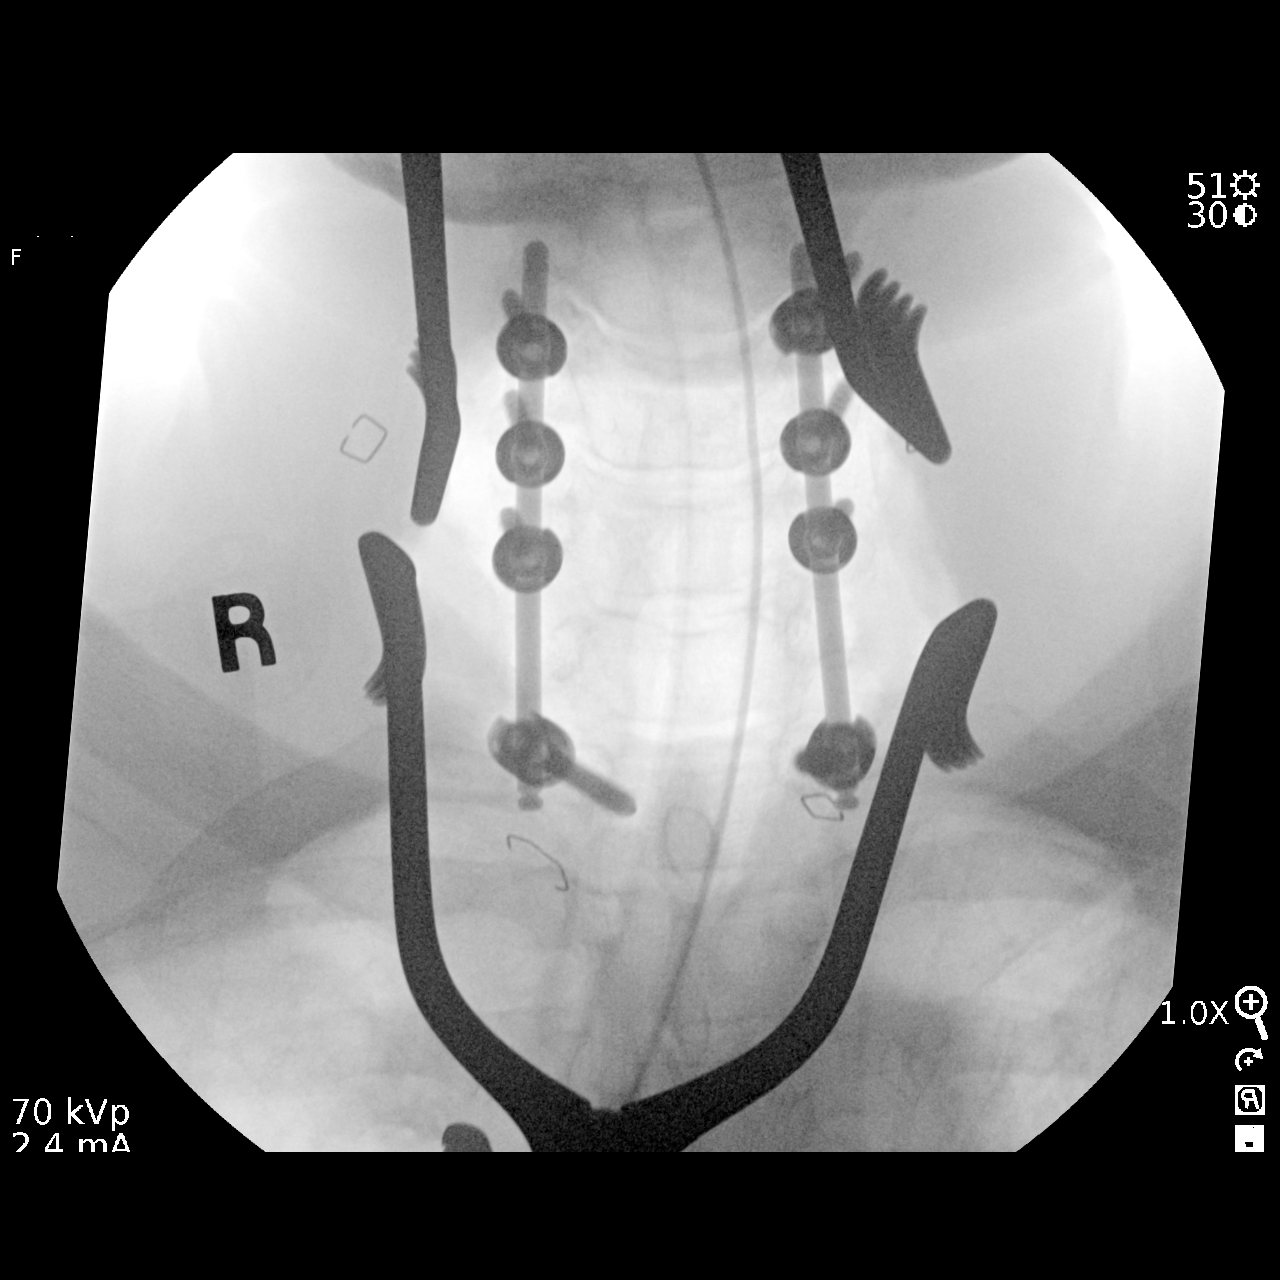

[1 of 1 positions shown; findings below may reference images not displayed]

FINDINGS: Single intraoperative fluoroscopic image was obtained of the
cervical spine. Patient appears to be status post surgical posterior
fusion extending from C4-T1 with bilateral intrapedicular screw
placement.
IMPRESSION: Fluoroscopic guidance provided during cervical spine fusion.

## 2022-12-14 ENCOUNTER — Other Ambulatory Visit: Payer: Self-pay | Admitting: Family Medicine

## 2022-12-14 DIAGNOSIS — R269 Unspecified abnormalities of gait and mobility: Secondary | ICD-10-CM | POA: Diagnosis not present

## 2022-12-14 DIAGNOSIS — H532 Diplopia: Secondary | ICD-10-CM | POA: Diagnosis not present

## 2022-12-14 DIAGNOSIS — I700 Atherosclerosis of aorta: Secondary | ICD-10-CM | POA: Diagnosis not present

## 2022-12-14 DIAGNOSIS — Z9181 History of falling: Secondary | ICD-10-CM | POA: Diagnosis not present

## 2022-12-14 DIAGNOSIS — Z9989 Dependence on other enabling machines and devices: Secondary | ICD-10-CM | POA: Diagnosis not present

## 2022-12-14 DIAGNOSIS — R413 Other amnesia: Secondary | ICD-10-CM

## 2022-12-30 ENCOUNTER — Ambulatory Visit
Admission: RE | Admit: 2022-12-30 | Discharge: 2022-12-30 | Disposition: A | Discharge status: Home / Self Care | Payer: 59 | Source: Ambulatory Visit | Attending: Family Medicine | Admitting: Family Medicine

## 2022-12-30 DIAGNOSIS — R413 Other amnesia: Secondary | ICD-10-CM

## 2022-12-30 DIAGNOSIS — R410 Disorientation, unspecified: Secondary | ICD-10-CM | POA: Diagnosis not present

## 2022-12-30 MED ORDER — GADOPICLENOL 0.5 MMOL/ML IV SOLN
7.5000 mL | Freq: Once | INTRAVENOUS | Status: AC | PRN
  Administered 2022-12-30: 7.5 mL via INTRAVENOUS

## 2023-01-16 DIAGNOSIS — Z85528 Personal history of other malignant neoplasm of kidney: Secondary | ICD-10-CM | POA: Diagnosis not present

## 2023-01-16 DIAGNOSIS — R232 Flushing: Secondary | ICD-10-CM | POA: Diagnosis not present

## 2023-01-16 DIAGNOSIS — I10 Essential (primary) hypertension: Secondary | ICD-10-CM | POA: Diagnosis not present

## 2023-01-16 DIAGNOSIS — R634 Abnormal weight loss: Secondary | ICD-10-CM | POA: Diagnosis not present

## 2023-01-16 DIAGNOSIS — R42 Dizziness and giddiness: Secondary | ICD-10-CM | POA: Diagnosis not present

## 2023-01-16 DIAGNOSIS — E237 Disorder of pituitary gland, unspecified: Secondary | ICD-10-CM | POA: Diagnosis not present

## 2023-01-16 DIAGNOSIS — R55 Syncope and collapse: Secondary | ICD-10-CM | POA: Diagnosis not present

## 2023-01-16 DIAGNOSIS — E210 Primary hyperparathyroidism: Secondary | ICD-10-CM | POA: Diagnosis not present

## 2023-01-16 DIAGNOSIS — E042 Nontoxic multinodular goiter: Secondary | ICD-10-CM | POA: Diagnosis not present

## 2023-01-17 ENCOUNTER — Other Ambulatory Visit: Payer: Self-pay | Admitting: Internal Medicine

## 2023-01-17 DIAGNOSIS — R42 Dizziness and giddiness: Secondary | ICD-10-CM | POA: Diagnosis not present

## 2023-01-17 DIAGNOSIS — R55 Syncope and collapse: Secondary | ICD-10-CM | POA: Diagnosis not present

## 2023-01-17 DIAGNOSIS — E237 Disorder of pituitary gland, unspecified: Secondary | ICD-10-CM | POA: Diagnosis not present

## 2023-01-17 DIAGNOSIS — R232 Flushing: Secondary | ICD-10-CM | POA: Diagnosis not present

## 2023-01-17 DIAGNOSIS — Z1231 Encounter for screening mammogram for malignant neoplasm of breast: Secondary | ICD-10-CM

## 2023-01-17 DIAGNOSIS — R634 Abnormal weight loss: Secondary | ICD-10-CM | POA: Diagnosis not present

## 2023-01-17 DIAGNOSIS — E042 Nontoxic multinodular goiter: Secondary | ICD-10-CM | POA: Diagnosis not present

## 2023-01-17 DIAGNOSIS — I10 Essential (primary) hypertension: Secondary | ICD-10-CM | POA: Diagnosis not present

## 2023-01-17 DIAGNOSIS — E210 Primary hyperparathyroidism: Secondary | ICD-10-CM

## 2023-01-21 DIAGNOSIS — E210 Primary hyperparathyroidism: Secondary | ICD-10-CM | POA: Diagnosis not present

## 2023-01-21 DIAGNOSIS — E237 Disorder of pituitary gland, unspecified: Secondary | ICD-10-CM | POA: Diagnosis not present

## 2023-01-28 ENCOUNTER — Ambulatory Visit (INDEPENDENT_AMBULATORY_CARE_PROVIDER_SITE_OTHER): Payer: 59 | Admitting: Neurology

## 2023-01-28 ENCOUNTER — Encounter: Payer: Self-pay | Admitting: Neurology

## 2023-01-28 VITALS — BP 123/80 | HR 55 | Ht 63.0 in | Wt 167.2 lb

## 2023-01-28 DIAGNOSIS — F0781 Postconcussional syndrome: Secondary | ICD-10-CM | POA: Diagnosis not present

## 2023-01-28 DIAGNOSIS — R413 Other amnesia: Secondary | ICD-10-CM | POA: Diagnosis not present

## 2023-01-28 DIAGNOSIS — G959 Disease of spinal cord, unspecified: Secondary | ICD-10-CM | POA: Diagnosis not present

## 2023-01-28 DIAGNOSIS — R292 Abnormal reflex: Secondary | ICD-10-CM | POA: Diagnosis not present

## 2023-01-28 NOTE — Progress Notes (Signed)
[facility]  PATIENT: Teresa Collins DOB: 08/28/1951  REFERRING DOCTOR OR PCP: Aliene Beams, MD SOURCE: Patient, notes from primary care and ED, imaging and lab reports, MRI images of the brain and spine personally reviewed  _________________________________   HISTORICAL  CHIEF COMPLAINT:  Chief Complaint  Patient presents with   New Patient (Initial Visit)    Pt in room 11. New patient here for memory issues, gait disturbance, diplopia. Pt c/o lighted headed, fell at home in Feb. No recent falls,pt states since COVID in 05/2022 she noticed her balance is off, feels unsteady at times. Mri brain in March in epic. MOCA:27    HISTORY OF PRESENT ILLNESS:  I had the pleasure of seeing your patient, Teresa Collins, at [facility] for neurologic consultation regarding her memory issues, gait disturbance and diplopia.  She is a 72 year old woman who has noted some memory issues over the last couple months.  She fainted and hit her head on the left (tender with scab at occipital region on left).   She had been lightheaded before the fainting.   There was brief loss of consciousness.  She did not feel excessively groggy upon awakening.  There is no weakness.  There was no tongue biting or incontinence.  She was diagnosed with a concussion in the ED.    She had felt a little strange at the time and felt vision was blurry.  She also noted mildly reduced focus and attention at the time.    She is better now than she was immediately after the injury.   She still has a strange sensation but no pain around the eyes.    She has existing left arm numbness since spinal compression in 2022.  She has mild gait issues with slightly reduced balance and she uses rails on stairs but could easily walk a couple miles.     She had posterior cervical fusion in early 2023 after being found to have severe spinal stenosis at multiple levels.  She also has mild myelopathic  signal adjacent to C4C5.     She still has occasional lightheadedness, often after she stands.  She has only been in the 1 time.       01/28/2023   10:04 AM  Montreal Cognitive Assessment   Visuospatial/ Executive (0/5) 5  Naming (0/3) 3  Attention: Read list of digits (0/2) 1  Attention: Read list of letters (0/1) 1  Attention: Serial 7 subtraction starting at 100 (0/3) 3  Language: Repeat phrase (0/2) 2  Language : Fluency (0/1) 1  Abstraction (0/2) 2  Delayed Recall (0/5) 3  Orientation (0/6) 6  Total 27      Imaging: MRI of the brain 12/30/2022 showed normal brain volume.  There were scattered T2/FLAIR hyperintense foci in the hemispheres consistent with mild chronic microvascular ischemic change, typical for age.  A possible small hypoenhancing cyst versus adenoma was noted within the pituitary gland on postcontrast images.  This is not apparent on the precontrast images..  Of note, the pituitary gland a normal contour  MRI of the cervical spine 10/05/2021 shows severe spinal stenosis at C5-C6 associated with myelopathic signal within the spinal cord adjacent to C5 and probable left C5 nerve root compression.  There is also severe spinal stenosis at C5-C6 and C6-C7 but the adjacent spinal cord had normal signal.  Mild spinal stenosis at C4-C5.     An x-ray for fluoroscopic guidance during surgery 12/07/2021 shows ACDF hardware not present  in 2022.  The report states posterior fusion from C4-T1.  REVIEW OF SYSTEMS: Constitutional: No fevers, chills, sweats, or change in appetite Eyes: No visual changes, double vision, eye pain Ear, nose and throat: No hearing loss, ear pain, nasal congestion, sore throat Cardiovascular: No chest pain, palpitations Respiratory:  No shortness of breath at rest or with exertion.   No wheezes GastrointestinaI: No nausea, vomiting, diarrhea, abdominal pain, fecal incontinence Genitourinary:  No dysuria, urinary retention or frequency.  No  nocturia. Musculoskeletal:  No neck pain, back pain Integumentary: No rash, pruritus, skin lesions Neurological: as above Psychiatric: No depression at this time.  No anxiety Endocrine: No palpitations, diaphoresis, change in appetite, change in weigh or increased thirst Hematologic/Lymphatic:  No anemia, purpura, petechiae. Allergic/Immunologic: No itchy/runny eyes, nasal congestion, recent allergic reactions, rashes  ALLERGIES: Allergies  Allergen Reactions   Amlodipine Other (See Comments)    headache   Lipitor [Atorvastatin] Other (See Comments)    Muscle aches   Bystolic [Nebivolol Hcl] Swelling   Codeine Sulfate Hives    REACTION: rash and itching   Gabapentin     Severe Headache    Latex Rash    HOME MEDICATIONS:  Current Outpatient Medications:    acetaminophen (TYLENOL) 500 MG tablet, Take 1,000 mg by mouth every 6 (six) hours as needed for headache., Disp: , Rfl:    aspirin 81 MG chewable tablet, Chew by mouth daily., Disp: , Rfl:    clobetasol ointment (TEMOVATE) 0.05 %, 1 application 2 (two) times daily as needed (eczema)., Disp: , Rfl:    Cyanocobalamin (VITAMIN B 12) 500 MCG TABS, Take 500 mcg by mouth daily., Disp: , Rfl:    fluticasone (FLONASE) 50 MCG/ACT nasal spray, Place 1 spray into both nostrils daily., Disp: , Rfl:    levocetirizine (XYZAL) 5 MG tablet, Take 5 mg by mouth every evening., Disp: , Rfl:    lisinopril (ZESTRIL) 40 MG tablet, Take 40 mg by mouth daily., Disp: , Rfl:    rosuvastatin (CRESTOR) 20 MG tablet, Take 20 mg by mouth at bedtime., Disp: , Rfl:    methocarbamol (ROBAXIN) 500 MG tablet, Take 1 tablet (500 mg total) by mouth 2 (two) times daily. (Patient not taking: Reported on 02/12/2022), Disp: 20 tablet, Rfl: 0   nystatin (MYCOSTATIN/NYSTOP) powder, Apply 1 application topically 2 (two) times daily as needed (irritation)., Disp: , Rfl:   PAST MEDICAL HISTORY: Past Medical History:  Diagnosis Date   Arthritis    ASCUS favor benign  12/2015   negative high risk HPV. Recommend repeat Pap smear in one year   ASCUS of cervix with negative high risk HPV 12/2018   Eczema    GERD (gastroesophageal reflux disease)    Hx of colonic polyps    Hyperlipidemia    Hypertension    Incisional hernia    Numbness and tingling    RT THIGH   Renal cancer 2015   Left   STD (sexually transmitted disease)    GC   Vertigo     PAST SURGICAL HISTORY: Past Surgical History:  Procedure Laterality Date   abortions     CARPAL TUNNEL RELEASE Left 2017   CESAREAN SECTION     1 time   INSERTION OF MESH N/A 07/21/2014   Procedure: INSERTION OF MESH;  Surgeon: Abigail Miyamotoouglas Blackman, MD;  Location: [facility];  Service: General;  Laterality: N/A;   MYOMECTOMY ABDOMINAL APPROACH     NECK SURGERY  11/2021   POSTERIOR CERVICAL FUSION/FORAMINOTOMY N/A 12/07/2021  Procedure: Cervical Four-Cervical Seven Laminectomies/ Cervical Four -Thoracic One Posterior instrumented spinal fusion;  Surgeon: Jadene Pierini, MD;  Location: [facility] OR;  Service: Neurosurgery;  Laterality: N/A;  Cervical Four-Cervical Seven Laminectomies/ Cervical Four -Thoracic One Posterior instrumented spinal fusion   ROBOTIC ASSITED PARTIAL NEPHRECTOMY Left 07/21/2014   Procedure: ROBOTIC ASSISTED LEFT PARTIAL NEPHRECTOMY;  Surgeon: Crist Fat, MD;  Location: [facility];  Service: Urology;  Laterality: Left;   TUBAL LIGATION     VENTRAL HERNIA REPAIR N/A 07/21/2014   Procedure: INCISIONAL HERNIA REPAIR WITH MESH ;  Surgeon: Abigail Miyamoto, MD;  Location: [facility];  Service: General;  Laterality: N/A;    FAMILY HISTORY: Family History  Problem Relation Age of Onset   Hypertension Mother    Hypertension Sister    Hypertension Brother    Colon cancer Neg Hx    Esophageal cancer Neg Hx    Rectal cancer Neg Hx    Stomach cancer Neg Hx    Breast cancer Neg Hx    Colon polyps Neg Hx     SOCIAL HISTORY: Social History   Socioeconomic History   Marital status: Human resources officer    Spouse name: Not on file   Number of children: 1   Years of education: Not on file   Highest education level: Not on file  Occupational History   Not on file  Tobacco Use   Smoking status: Never   Smokeless tobacco: Never  Vaping Use   Vaping Use: Never used  Substance and Sexual Activity   Alcohol use: Not Currently    Alcohol/week: 0.0 standard drinks of alcohol   Drug use: Never   Sexual activity: Yes    Birth control/protection: Post-menopausal    Comment: 1st intercourse 36 yo-5 partners  Other Topics Concern   Not on file  Social History Narrative   Right handed    Wear glasses    Drinks coffee daily, rare sweet tea usage, rare soda usage   Social Determinants of Health   Financial Resource Strain: Not on file  Food Insecurity: Not on file  Transportation Needs: Not on file  Physical Activity: Not on file  Stress: Not on file  Social Connections: Not on file  Intimate Partner Violence: Not on file       PHYSICAL EXAM  Vitals:   01/28/23 0953 01/28/23 1003  BP: (!) 167/79 123/80  Pulse: (!) 55   Weight: 167 lb 3.2 oz (75.8 kg)   Height: 5\' 3"  (1.6 m)     Body mass index is 29.62 kg/m.   General: The patient is well-developed and well-nourished and in no acute distress  HEENT:  Head is [place]/AT.  Sclera are anicteric.    Neck: No carotid bruits are noted.  The neck is nontender.  Cardiovascular: The heart has a regular rate and rhythm with a normal S1 and S2. There were no murmurs, gallops or rubs.    Skin: Extremities are without rash or  edema.  Musculoskeletal:  Back is nontender  Neurologic Exam  Mental status: The patient is alert and oriented x 3 at the time of the examination. The patient has apparent normal recent and remote memory, with an apparently normal attention span and concentration ability.   Speech is normal.  Cranial nerves: Extraocular movements are full. Pupils are equal, round, and reactive to light and  accomodation.  Facial symmetry is present. There is good facial sensation to soft touch bilaterally.Facial strength is normal.  PE/TP normal.  Trapezius and sternocleidomastoid strength is normal. No dysarthria is noted.  No obvious hearing deficits are noted.  Motor:  Muscle bulk is normal.   Tone is normal. Strength is  5 / 5 in all 4 extremities.   Sensory: Sensory testing is intact to pinprick, soft touch and vibration sensation in legs.  She has altered sensation in C5 and C6 +/- C7 dermatomes on the left vs the right to touch but symmetric vibration.  Coordination: Cerebellar testing reveals good finger-nose-finger and heel-to-shin bilaterally.  Gait and station: Station is normal.   Gait is normal. Tandem gait is mildly wide but normal for age. Romberg is negative.   Reflexes: Deep tendon reflexes are symmetric and normal bilaterally.   Plantar responses are flexor.    DIAGNOSTIC DATA (LABS, IMAGING, TESTING) - I reviewed patient records, labs, notes, testing and imaging myself where available.  Lab Results  Component Value Date   WBC 4.9 12/04/2022   HGB 13.9 12/04/2022   HCT 43.6 12/04/2022   MCV 94.0 12/04/2022   PLT 175 12/04/2022      Component Value Date/Time   NA 141 12/04/2022 1252   K 4.6 12/04/2022 1252   CL 106 12/04/2022 1252   CO2 29 12/04/2022 1252   GLUCOSE 96 12/04/2022 1252   BUN 20 12/04/2022 1252   CREATININE 1.34 (H) 12/04/2022 1252   CALCIUM 10.2 12/04/2022 1252   PROT 7.9 06/06/2022 1210   ALBUMIN 4.0 06/06/2022 1210   AST 25 06/06/2022 1210   ALT 18 06/06/2022 1210   ALKPHOS 64 06/06/2022 1210   BILITOT 0.7 06/06/2022 1210   GFRNONAA 43 (L) 12/04/2022 1252   GFRAA 57 (L) 10/01/2019 0037   GFRAA 57 (L) 10/01/2019 0037   Lab Results  Component Value Date   CHOL 202 (H) 05/19/2019   HDL 65.50 05/19/2019   LDLCALC 112 (H) 05/19/2019   LDLDIRECT 166.8 03/31/2013   TRIG 120.0 05/19/2019   CHOLHDL 3 05/19/2019   Lab Results  Component  Value Date   HGBA1C 5.5 10/20/2019   Lab Results  Component Value Date   VITAMINB12 469 12/27/2008   Lab Results  Component Value Date   TSH 0.45 05/19/2019       ASSESSMENT AND PLAN  Post concussive syndrome  Cervical myelopathy  Hyperreflexia  Memory loss    In summary, Ms. Veldman is a 72 year old woman with memory disturbance since a head injury with probable concussion 2 months ago.  Symptoms are consistent with a postconcussive headache as she is improving.  We discussed that this can take a few months but hopefully she will be back to baseline over the next month or 2.  She occasionally will get lightheadedness but this does not occur every time she stands.  If this worsens and occurs daily consider a medication such as Mestinon or Florinef.  Description of the February event is most consistent with syncope.  However, if this occurs again we will recommend an EEG.  Other problem is his sequela of spinal cord compression in 2022.  She has a myelopathic scar within the spinal cord adjacent to C5.  She has had multilevel posterior fusion.  We discussed that the fusion will help to prevent future spinal cord compression  As she is improving, I will make the follow-up as needed.  She should call us if she has significant worsening of the neurologic symptoms or new symptoms.  Thank you for asking me to see Ms. Mastel.  Please let me know if  I can be of further assistance in the future.   Giuliano Preece A. Epimenio Foot, MD, [facility] 01/28/2023, 10:55 AM Certified in Neurology, Clinical Neurophysiology, Sleep Medicine and Neuroimaging  [facility] [address] [place], [place] [address] [phone]

## 2023-01-29 DIAGNOSIS — E237 Disorder of pituitary gland, unspecified: Secondary | ICD-10-CM | POA: Diagnosis not present

## 2023-01-29 DIAGNOSIS — I10 Essential (primary) hypertension: Secondary | ICD-10-CM | POA: Diagnosis not present

## 2023-01-29 DIAGNOSIS — R232 Flushing: Secondary | ICD-10-CM | POA: Diagnosis not present

## 2023-01-29 DIAGNOSIS — Z85528 Personal history of other malignant neoplasm of kidney: Secondary | ICD-10-CM | POA: Diagnosis not present

## 2023-01-29 DIAGNOSIS — R42 Dizziness and giddiness: Secondary | ICD-10-CM | POA: Diagnosis not present

## 2023-01-29 DIAGNOSIS — E210 Primary hyperparathyroidism: Secondary | ICD-10-CM | POA: Diagnosis not present

## 2023-01-29 DIAGNOSIS — E042 Nontoxic multinodular goiter: Secondary | ICD-10-CM | POA: Diagnosis not present

## 2023-03-07 DIAGNOSIS — N1831 Chronic kidney disease, stage 3a: Secondary | ICD-10-CM | POA: Diagnosis not present

## 2023-03-07 DIAGNOSIS — D696 Thrombocytopenia, unspecified: Secondary | ICD-10-CM | POA: Diagnosis not present

## 2023-03-07 DIAGNOSIS — I10 Essential (primary) hypertension: Secondary | ICD-10-CM | POA: Diagnosis not present

## 2023-03-07 DIAGNOSIS — I700 Atherosclerosis of aorta: Secondary | ICD-10-CM | POA: Diagnosis not present

## 2023-04-15 DIAGNOSIS — Z961 Presence of intraocular lens: Secondary | ICD-10-CM | POA: Diagnosis not present

## 2023-04-15 DIAGNOSIS — S060XAS Concussion with loss of consciousness status unknown, sequela: Secondary | ICD-10-CM | POA: Diagnosis not present

## 2023-06-11 DIAGNOSIS — J0100 Acute maxillary sinusitis, unspecified: Secondary | ICD-10-CM | POA: Diagnosis not present

## 2023-07-29 ENCOUNTER — Ambulatory Visit
Admission: RE | Admit: 2023-07-29 | Discharge: 2023-07-29 | Disposition: A | Discharge status: Home / Self Care | Payer: 59 | Source: Ambulatory Visit | Attending: Internal Medicine | Admitting: Internal Medicine

## 2023-07-29 ENCOUNTER — Inpatient Hospital Stay
Admission: RE | Admit: 2023-07-29 | Discharge: 2023-07-29 | Disposition: A | Discharge status: Home / Self Care | Payer: 59 | Source: Ambulatory Visit | Attending: Internal Medicine | Admitting: Internal Medicine

## 2023-07-29 DIAGNOSIS — N189 Chronic kidney disease, unspecified: Secondary | ICD-10-CM | POA: Diagnosis not present

## 2023-07-29 DIAGNOSIS — Z1231 Encounter for screening mammogram for malignant neoplasm of breast: Secondary | ICD-10-CM | POA: Diagnosis not present

## 2023-07-29 DIAGNOSIS — E349 Endocrine disorder, unspecified: Secondary | ICD-10-CM | POA: Diagnosis not present

## 2023-07-29 DIAGNOSIS — M8588 Other specified disorders of bone density and structure, other site: Secondary | ICD-10-CM | POA: Diagnosis not present

## 2023-07-29 DIAGNOSIS — E210 Primary hyperparathyroidism: Secondary | ICD-10-CM

## 2023-09-12 DIAGNOSIS — Z23 Encounter for immunization: Secondary | ICD-10-CM | POA: Diagnosis not present

## 2023-09-12 DIAGNOSIS — Z85528 Personal history of other malignant neoplasm of kidney: Secondary | ICD-10-CM | POA: Diagnosis not present

## 2023-09-12 DIAGNOSIS — Z0000 Encounter for general adult medical examination without abnormal findings: Secondary | ICD-10-CM | POA: Diagnosis not present

## 2023-09-12 DIAGNOSIS — R140 Abdominal distension (gaseous): Secondary | ICD-10-CM | POA: Diagnosis not present

## 2023-09-12 DIAGNOSIS — I700 Atherosclerosis of aorta: Secondary | ICD-10-CM | POA: Diagnosis not present

## 2023-09-12 DIAGNOSIS — I10 Essential (primary) hypertension: Secondary | ICD-10-CM | POA: Diagnosis not present

## 2023-09-12 DIAGNOSIS — Z9181 History of falling: Secondary | ICD-10-CM | POA: Diagnosis not present

## 2023-12-03 DIAGNOSIS — R140 Abdominal distension (gaseous): Secondary | ICD-10-CM | POA: Diagnosis not present

## 2023-12-03 DIAGNOSIS — I10 Essential (primary) hypertension: Secondary | ICD-10-CM | POA: Diagnosis not present

## 2023-12-19 DIAGNOSIS — M7602 Gluteal tendinitis, left hip: Secondary | ICD-10-CM | POA: Diagnosis not present

## 2024-01-21 DIAGNOSIS — D351 Benign neoplasm of parathyroid gland: Secondary | ICD-10-CM | POA: Diagnosis not present

## 2024-01-21 DIAGNOSIS — D631 Anemia in chronic kidney disease: Secondary | ICD-10-CM | POA: Diagnosis not present

## 2024-01-21 DIAGNOSIS — Z85528 Personal history of other malignant neoplasm of kidney: Secondary | ICD-10-CM | POA: Diagnosis not present

## 2024-01-21 DIAGNOSIS — N1831 Chronic kidney disease, stage 3a: Secondary | ICD-10-CM | POA: Diagnosis not present

## 2024-01-21 DIAGNOSIS — K589 Irritable bowel syndrome without diarrhea: Secondary | ICD-10-CM | POA: Diagnosis not present

## 2024-01-21 DIAGNOSIS — N189 Chronic kidney disease, unspecified: Secondary | ICD-10-CM | POA: Diagnosis not present

## 2024-01-21 DIAGNOSIS — I129 Hypertensive chronic kidney disease with stage 1 through stage 4 chronic kidney disease, or unspecified chronic kidney disease: Secondary | ICD-10-CM | POA: Diagnosis not present

## 2024-01-30 DIAGNOSIS — M8580 Other specified disorders of bone density and structure, unspecified site: Secondary | ICD-10-CM | POA: Diagnosis not present

## 2024-01-30 DIAGNOSIS — E210 Primary hyperparathyroidism: Secondary | ICD-10-CM | POA: Diagnosis not present

## 2024-01-30 DIAGNOSIS — Z85528 Personal history of other malignant neoplasm of kidney: Secondary | ICD-10-CM | POA: Diagnosis not present

## 2024-01-30 DIAGNOSIS — E237 Disorder of pituitary gland, unspecified: Secondary | ICD-10-CM | POA: Diagnosis not present

## 2024-01-30 DIAGNOSIS — E559 Vitamin D deficiency, unspecified: Secondary | ICD-10-CM | POA: Diagnosis not present

## 2024-01-30 DIAGNOSIS — E042 Nontoxic multinodular goiter: Secondary | ICD-10-CM | POA: Diagnosis not present

## 2024-01-30 DIAGNOSIS — I10 Essential (primary) hypertension: Secondary | ICD-10-CM | POA: Diagnosis not present

## 2024-01-30 NOTE — Progress Notes (Unsigned)
 Celso Amy, PA-C [address] [place], [place]  [address] Phone: [phone]   Primary Care Physician: Aliene Beams, MD  Primary Gastroenterologist:  Celso Amy, PA-C / Yancey Flemings, MD   Chief Complaint: Fecal incontinence       HPI:   Teresa Collins is a 73 y.o. female is referred by PCP to evaluate fecal incontinence.  Current Symptoms: Patient states she has had intermittent episodes of fecal incontinence for 6 months.  She was having some mild constipation.  Was started on Benefiber powder OTC and her fecal incontinence started after that.  She may have no bowel movement for 2 or 3 days and then have 1 or 3 mushy stools for a few days.  Had episodes where she passed loose mushy stool into her underwear.  Unable to get to the bathroom in time.  Fecal incontinence episodes occur 2 or 3 episodes in a week and then no episodes for 2 or 3 weeks.  She denies any new medications, no recent antibiotics, no dietary changes.  Has mild intermittent occasional lower abdominal cramping once per week.  Reports increased bowel sounds.  She denies melena, hematochezia, weight loss, or alarm symptoms.  No family history of colon cancer.  She is able to go to the gym and exercise 4 days/week with no difficulty.  She is fearful of having  incontinence.  GI History: IBS, GERD, history of multiple adenomatous colon polyps and adenoma with villous component.  Has had 4 colonoscopies since 2012.  02/2022 last Colonoscopy by Dr. Marina Goodell: 4 small (2 mm to 4 mm) tubular adenoma polyps removed.  No dysplasia.  Excellent prep with Suprep.  Internal hemorrhoids.  Diverticulosis.  5-year repeat will be due 02/2027.  Current Outpatient Medications  Medication Sig Dispense Refill   acetaminophen (TYLENOL) 500 MG tablet Take 1,000 mg by mouth every 6 (six) hours as needed for headache.     aspirin 81 MG chewable tablet Chew by mouth daily.     clobetasol ointment (TEMOVATE) 0.05 % 1 application 2  (two) times daily as needed (eczema).     Cyanocobalamin (VITAMIN B 12) 500 MCG TABS Take 500 mcg by mouth daily.     fluticasone (FLONASE) 50 MCG/ACT nasal spray Place 1 spray into both nostrils daily.     levocetirizine (XYZAL) 5 MG tablet Take 5 mg by mouth every evening.     lisinopril (ZESTRIL) 40 MG tablet Take 40 mg by mouth daily.     rosuvastatin (CRESTOR) 20 MG tablet Take 20 mg by mouth at bedtime.     No current facility-administered medications for this visit.    Allergies as of 01/31/2024 - Review Complete 01/31/2024  Allergen Reaction Noted   Amlodipine Other (See Comments) 12/30/2018   Lipitor [atorvastatin] Other (See Comments) 08/13/2013   Bystolic [nebivolol hcl] Swelling 12/26/2016   Codeine sulfate Hives 11/16/2009   Gabapentin  11/29/2021   Latex Rash 10/16/2011    Past Medical History:  Diagnosis Date   Arthritis    ASCUS favor benign 12/2015   negative high risk HPV. Recommend repeat Pap smear in one year   ASCUS of cervix with negative high risk HPV 12/2018   Eczema    GERD (gastroesophageal reflux disease)    Hx of colonic polyps    Hyperlipidemia    Hypertension    Incisional hernia    Numbness and tingling    RT THIGH   Renal cancer (HCC) 2015   Left  STD (sexually transmitted disease)    GC   Vertigo     Past Surgical History:  Procedure Laterality Date   abortions     CARPAL TUNNEL RELEASE Left 2017   CESAREAN SECTION     1 time   INSERTION OF MESH N/A 07/21/2014   Procedure: INSERTION OF MESH;  Surgeon: Abigail Miyamoto, MD;  Location: [facility];  Service: General;  Laterality: N/A;   MYOMECTOMY ABDOMINAL APPROACH     NECK SURGERY  11/2021   POSTERIOR CERVICAL FUSION/FORAMINOTOMY N/A 12/07/2021   Procedure: Cervical Four-Cervical Seven Laminectomies/ Cervical Four -Thoracic One Posterior instrumented spinal fusion;  Surgeon: Jadene Pierini, MD;  Location: [facility];  Service: Neurosurgery;  Laterality: N/A;  Cervical Four-Cervical  Seven Laminectomies/ Cervical Four -Thoracic One Posterior instrumented spinal fusion   ROBOTIC ASSITED PARTIAL NEPHRECTOMY Left 07/21/2014   Procedure: ROBOTIC ASSISTED LEFT PARTIAL NEPHRECTOMY;  Surgeon: Crist Fat, MD;  Location: [facility];  Service: Urology;  Laterality: Left;   TUBAL LIGATION     VENTRAL HERNIA REPAIR N/A 07/21/2014   Procedure: INCISIONAL HERNIA REPAIR WITH MESH ;  Surgeon: Abigail Miyamoto, MD;  Location: [facility];  Service: General;  Laterality: N/A;    Review of Systems:    All systems reviewed and negative except where noted in HPI.    Physical Exam:  BP 114/66   Pulse 65   Ht 5\' 3"  (1.6 m)   Wt 185 lb (83.9 kg)   BMI 32.77 kg/m  No LMP recorded. Patient is postmenopausal.  General: Well-nourished, well-developed in no acute distress.  Lungs: Clear to auscultation bilaterally. Non-labored. Heart: Regular rate and rhythm, no murmurs rubs or gallops.  Abdomen: Bowel sounds are normal; Abdomen is Soft; No hepatosplenomegaly, masses or hernias; very mild generalized upper and lower abdominal Tenderness; No guarding or rebound tenderness. Rectal: No external hemorrhoids, rashes, anal fissure, or masses.  Decreased anal sphincter tone.  No evidence of rectal prolapse with bearing down. Anoscopy: Small internal hemorrhoids, No bleeding, No rectal lesions, brown soft stool in vault. Neuro: Alert and oriented x 3.  Grossly intact.  Psych: Alert and cooperative, normal mood and affect.  Chaperone for exam: Jovita Kussmaul, CMA  Imaging Studies: No results found.  Assessment and Plan:   Teresa Collins is a 73 y.o. y/o female presents for:  1.  Fecal incontinence; intermittent and episodic; suspect due to decreased anal sphincter tone and weak pelvic floor muscles.  No alarm symptoms or exam findings.    Stopped Benefiber powder which did not work well.  Start OTC FiberCon tablets, take 2 tablets with 8 ounces of water once daily.  Increase to 2 tablets  twice daily if needed based on bowel habits.  Drink 64 ounces of water/fluids daily.  I instructed patient on how to do Kegel exercises to strengthen pelvic floor muscles.  If no improvement in 6 weeks, then anorectal manometry is the next step.  Also consider referral for pelvic floor physical therapy if no improvement.  2.  History of multiple adenomatous colon polyps  5-year repeat colonoscopy will be due 02/2027   Celso Amy, PA-C  Follow up 6 weeks for f/u fecal incontinence with TG.

## 2024-01-31 ENCOUNTER — Ambulatory Visit: Admitting: Physician Assistant

## 2024-01-31 ENCOUNTER — Encounter: Payer: Self-pay | Admitting: Physician Assistant

## 2024-01-31 VITALS — BP 114/66 | HR 65 | Ht 63.0 in | Wt 185.0 lb

## 2024-01-31 DIAGNOSIS — Z860101 Personal history of adenomatous and serrated colon polyps: Secondary | ICD-10-CM | POA: Diagnosis not present

## 2024-01-31 DIAGNOSIS — K5900 Constipation, unspecified: Secondary | ICD-10-CM

## 2024-01-31 DIAGNOSIS — R159 Full incontinence of feces: Secondary | ICD-10-CM

## 2024-01-31 DIAGNOSIS — K6289 Other specified diseases of anus and rectum: Secondary | ICD-10-CM | POA: Diagnosis not present

## 2024-01-31 DIAGNOSIS — K648 Other hemorrhoids: Secondary | ICD-10-CM

## 2024-01-31 NOTE — Patient Instructions (Signed)
   Take 2 Caplets with a full glass of water or other liquid (8 ounces/240 milliliters)  Once Daily.  Increase to 2 Caplets Twice daily if needed.   Drink 64 ounces of water daily.

## 2024-01-31 NOTE — Progress Notes (Signed)
 Noted.

## 2024-02-03 DIAGNOSIS — E210 Primary hyperparathyroidism: Secondary | ICD-10-CM | POA: Diagnosis not present

## 2024-02-03 DIAGNOSIS — E559 Vitamin D deficiency, unspecified: Secondary | ICD-10-CM | POA: Diagnosis not present

## 2024-02-05 ENCOUNTER — Telehealth: Payer: Self-pay | Admitting: Physician Assistant

## 2024-02-05 NOTE — Telephone Encounter (Signed)
 Patient states her nephrologist told her she cannot take any medications that have extra calcium. Patient was told at her appt with Brigitte Canard, PA to start Fibercon. Spoke with Brigitte Canard, PA and patient can be changed to Metamucil fiber supplement in place of Fibercon. Informed patient of Tina's recommendations. Patient verbalized understanding.

## 2024-02-05 NOTE — Telephone Encounter (Signed)
 Inbound call from patient requesting a call to discuss her taking FiberCon. Please advise.

## 2024-02-27 ENCOUNTER — Other Ambulatory Visit: Payer: Self-pay | Admitting: General Surgery

## 2024-02-27 DIAGNOSIS — E0590 Thyrotoxicosis, unspecified without thyrotoxic crisis or storm: Secondary | ICD-10-CM

## 2024-02-27 DIAGNOSIS — E210 Primary hyperparathyroidism: Secondary | ICD-10-CM | POA: Diagnosis not present

## 2024-02-28 ENCOUNTER — Ambulatory Visit: Payer: Self-pay | Admitting: General Surgery

## 2024-02-28 DIAGNOSIS — E210 Primary hyperparathyroidism: Secondary | ICD-10-CM | POA: Diagnosis not present

## 2024-03-03 ENCOUNTER — Ambulatory Visit
Admission: RE | Admit: 2024-03-03 | Discharge: 2024-03-03 | Disposition: A | Discharge status: Home / Self Care | Source: Ambulatory Visit | Attending: General Surgery | Admitting: General Surgery

## 2024-03-03 DIAGNOSIS — E039 Hypothyroidism, unspecified: Secondary | ICD-10-CM | POA: Diagnosis not present

## 2024-03-03 DIAGNOSIS — E0590 Thyrotoxicosis, unspecified without thyrotoxic crisis or storm: Secondary | ICD-10-CM

## 2024-03-12 NOTE — Progress Notes (Deleted)
 Brigitte Canard, PA-C [address] [place], [place]  [address] Phone: [phone]   Primary Care Physician: Dorena Gander, MD  Primary Gastroenterologist:  Brigitte Canard, PA-C / ***  Chief Complaint:  F/U Fecal Incontinence       HPI:   Teresa Collins is a 73 y.o. female returns for 6-week follow-up of fecal incontinence which has been ongoing for 7 months.  Episodes occur every few weeks.  6 weeks ago she was switched from Benefiber powder to FiberCon tablets, 2 tablets twice daily.  Encouraged to do Kegel pelvic floor muscle exercises.  If no improvement consider anorectal manometry and pelvic floor physical therapy.  She is scheduled for parathyroidectomy 03/30/2024.  GI History: IBS, GERD, history of multiple adenomatous colon polyps and adenoma with villous component.  Has had 4 colonoscopies since 2012.   02/2022 last Colonoscopy by Dr. Elvin Hammer: 4 small (2 mm to 4 mm) tubular adenoma polyps removed.  No dysplasia.  Excellent prep with Suprep.  Internal hemorrhoids.  Diverticulosis.  5-year repeat will be due 02/2027  Current Outpatient Medications  Medication Sig Dispense Refill   acetaminophen  (TYLENOL ) 500 MG tablet Take 1,000 mg by mouth every 6 (six) hours as needed for headache.     aspirin 81 MG chewable tablet Chew 81 mg by mouth in the morning.     clobetasol  ointment (TEMOVATE ) 0.05 % 1 application 2 (two) times daily as needed (eczema).     Cyanocobalamin  (VITAMIN B 12) 500 MCG TABS Take 500 mcg by mouth in the morning.     fluticasone  (FLONASE ) 50 MCG/ACT nasal spray Place 1 spray into both nostrils daily as needed for allergies.     levocetirizine (XYZAL ) 5 MG tablet Take 5 mg by mouth every evening.     lisinopril  (ZESTRIL ) 40 MG tablet Take 40 mg by mouth every evening.     rosuvastatin  (CRESTOR ) 20 MG tablet Take 20 mg by mouth in the morning.     No current facility-administered medications for this visit.    Allergies as of 03/13/2024 - Review  Complete 03/10/2024  Allergen Reaction Noted   Amlodipine  Other (See Comments) 12/30/2018   Lipitor [atorvastatin] Other (See Comments) 08/13/2013   Bystolic  [nebivolol  hcl] Swelling 12/26/2016   Codeine sulfate Hives 11/16/2009   Gabapentin  11/29/2021   Latex Rash 10/16/2011    Past Medical History:  Diagnosis Date   Arthritis    ASCUS favor benign 12/2015   negative high risk HPV. Recommend repeat Pap smear in one year   ASCUS of cervix with negative high risk HPV 12/2018   Eczema    GERD (gastroesophageal reflux disease)    Hx of colonic polyps    Hyperlipidemia    Hypertension    Incisional hernia    Numbness and tingling    RT THIGH   Renal cancer (HCC) 2015   Left   STD (sexually transmitted disease)    GC   Vertigo     Past Surgical History:  Procedure Laterality Date   abortions     CARPAL TUNNEL RELEASE Left 2017   CESAREAN SECTION     1 time   INSERTION OF MESH N/A 07/21/2014   Procedure: INSERTION OF MESH;  Surgeon: Oza Blumenthal, MD;  Location: [facility];  Service: General;  Laterality: N/A;   MYOMECTOMY ABDOMINAL APPROACH     NECK SURGERY  11/2021   POSTERIOR CERVICAL FUSION/FORAMINOTOMY N/A 12/07/2021   Procedure: Cervical Four-Cervical Seven Laminectomies/ Cervical Four -  Thoracic One Posterior instrumented spinal fusion;  Surgeon: Cannon Champion, MD;  Location: [facility];  Service: Neurosurgery;  Laterality: N/A;  Cervical Four-Cervical Seven Laminectomies/ Cervical Four -Thoracic One Posterior instrumented spinal fusion   ROBOTIC ASSITED PARTIAL NEPHRECTOMY Left 07/21/2014   Procedure: ROBOTIC ASSISTED LEFT PARTIAL NEPHRECTOMY;  Surgeon: Andrez Banker, MD;  Location: [facility];  Service: Urology;  Laterality: Left;   TUBAL LIGATION     VENTRAL HERNIA REPAIR N/A 07/21/2014   Procedure: INCISIONAL HERNIA REPAIR WITH MESH ;  Surgeon: Oza Blumenthal, MD;  Location: [facility];  Service: General;  Laterality: N/A;    Review of Systems:    All systems  reviewed and negative except where noted in HPI.    Physical Exam:  There were no vitals taken for this visit. No LMP recorded. Patient is postmenopausal.  General: Well-nourished, well-developed in no acute distress.  Lungs: Clear to auscultation bilaterally. Non-labored. Heart: Regular rate and rhythm, no murmurs rubs or gallops.  Abdomen: Bowel sounds are normal; Abdomen is Soft; No hepatosplenomegaly, masses or hernias;  No Abdominal Tenderness; No guarding or rebound tenderness. Neuro: Alert and oriented x 3.  Grossly intact.  Psych: Alert and cooperative, normal mood and affect.   Imaging Studies: US  THYROID  Result Date: 03/08/2024 CLINICAL DATA:  Hypothyroid. EXAM: THYROID  ULTRASOUND TECHNIQUE: Ultrasound examination of the thyroid  gland and adjacent soft tissues was performed. COMPARISON:  Prior thyroid  ultrasound 08/16/2022 FINDINGS: Parenchymal Echotexture: Mildly heterogenous Isthmus: 0.6 cm Right lobe: 6.4 x 2.0 x 2.1 cm Left lobe: 4.6 x 1.7 x 2.3 cm _________________________________________________________ Estimated total number of nodules >/= 1 cm: 0 Number of spongiform nodules >/=  2 cm not described below (TR1): 0 Number of mixed cystic and solid nodules >/= 1.5 cm not described below (TR2): 0 _________________________________________________________ No discrete nodules of consequence are seen within the thyroid  gland. Incidental note is made of small subcentimeter nodules in the right mid gland which demonstrate no change compared to 2023. As before, these lesions do not meet criteria to warrant biopsy or dedicated imaging surveillance. IMPRESSION: Stable appearance of the thyroid  gland. No thyroid  nodules identified that would meet criteria for biopsy or imaging surveillance. The above is in keeping with the ACR TI-RADS recommendations - J Am Coll Radiol 2017;14:587-595. Electronically Signed   By: Fernando Hoyer M.D.   On: 03/08/2024 15:37    Labs: CBC    Component Value  Date/Time   WBC 4.9 12/04/2022 1252   RBC 4.64 12/04/2022 1252   HGB 13.9 12/04/2022 1252   HCT 43.6 12/04/2022 1252   PLT 175 12/04/2022 1252   MCV 94.0 12/04/2022 1252   MCH 30.0 12/04/2022 1252   MCHC 31.9 12/04/2022 1252   RDW 13.5 12/04/2022 1252   LYMPHSABS 1.7 12/04/2022 1252   MONOABS 0.6 12/04/2022 1252   EOSABS 0.1 12/04/2022 1252   BASOSABS 0.0 12/04/2022 1252    CMP     Component Value Date/Time   NA 141 12/04/2022 1252   K 4.6 12/04/2022 1252   CL 106 12/04/2022 1252   CO2 29 12/04/2022 1252   GLUCOSE 96 12/04/2022 1252   BUN 20 12/04/2022 1252   CREATININE 1.34 (H) 12/04/2022 1252   CALCIUM  10.2 12/04/2022 1252   PROT 7.9 06/06/2022 1210   ALBUMIN  4.0 06/06/2022 1210   AST 25 06/06/2022 1210   ALT 18 06/06/2022 1210   ALKPHOS 64 06/06/2022 1210   BILITOT 0.7 06/06/2022 1210   GFRNONAA 43 (L) 12/04/2022 1252   GFRAA  57 (L) 10/01/2019 0037   GFRAA 57 (L) 10/01/2019 0037       Assessment and Plan:   Teresa Collins is a 72 y.o. y/o female   1.  Fecal incontinence - Continue FiberCon - Anorectal manometry if symptoms persist - Pelvic floor physical therapy  2.  Irritable bowel syndrome  3.  History of multiple adenomatous colon polyps - 5-year repeat colonoscopy will be due 02/2027    Brigitte Canard, PA-C  Follow up ***

## 2024-03-13 ENCOUNTER — Ambulatory Visit: Admitting: Physician Assistant

## 2024-03-17 NOTE — Patient Instructions (Signed)
 SURGICAL WAITING ROOM VISITATION  Patients having surgery or a procedure may have no more than 2 support people in the waiting area - these visitors may rotate.    Children under the age of 39 must have an adult with them who is not the patient.  Due to an increase in RSV and influenza rates and associated hospitalizations, children ages 18 and under may not visit patients in [facility] hospitals.  Visitors with respiratory illnesses are discouraged from visiting and should remain at home.  If the patient needs to stay at the hospital during part of their recovery, the visitor guidelines for inpatient rooms apply. Pre-op nurse will coordinate an appropriate time for 1 support person to accompany patient in pre-op.  This support person may not rotate.    Please refer to the [facility] website for the visitor guidelines for Inpatients (after your surgery is over and you are in a regular room).       Your procedure is scheduled on: 03/30/24   Report to [facility] Main Entrance    Report to admitting at : 8:45 AM   Call this number if you have problems the morning of surgery [phone]   Do not eat food :After Midnight.   After Midnight you may have the following liquids until : 8:00 AM DAY OF SURGERY  Water  Non-Citrus Juices (without pulp, NO RED-Apple, White grape, White cranberry) Black Coffee (NO MILK/CREAM OR CREAMERS, sugar ok)  Clear Tea (NO MILK/CREAM OR CREAMERS, sugar ok) regular and decaf                             Plain Jell-O (NO RED)                                           Fruit ices (not with fruit pulp, NO RED)                                     Popsicles (NO RED)                                                               Sports drinks like Gatorade (NO RED)             FOLLOW ANY ADDITIONAL PRE OP INSTRUCTIONS YOU RECEIVED FROM YOUR SURGEON'S OFFICE!!!   Oral Hygiene is also important to reduce your risk of infection.                                     Remember - BRUSH YOUR TEETH THE MORNING OF SURGERY WITH YOUR REGULAR TOOTHPASTE  DENTURES WILL BE REMOVED PRIOR TO SURGERY PLEASE DO NOT APPLY "Poly grip" OR ADHESIVES!!!   Do NOT smoke after Midnight   Stop all vitamins and herbal supplements 7 days before surgery.   Take these medicines the morning of surgery with A SIP OF WATER : NONE. Tylenol  as needed.  You may not have any metal on your body including hair pins, jewelry, and body piercing             Do not wear make-up, lotions, powders, perfumes/cologne, or deodorant  Do not wear nail polish including gel and S&S, artificial/acrylic nails, or any other type of covering on natural nails including finger and toenails. If you have artificial nails, gel coating, etc. that needs to be removed by a nail salon please have this removed prior to surgery or surgery may need to be canceled/ delayed if the surgeon/ anesthesia feels like they are unable to be safely monitored.   Do not shave  48 hours prior to surgery.    Do not bring valuables to the hospital. [facility] IS NOT             RESPONSIBLE   FOR VALUABLES.   Contacts, glasses, dentures or bridgework may not be worn into surgery.   Bring small overnight bag day of surgery.   DO NOT BRING YOUR HOME MEDICATIONS TO THE HOSPITAL. PHARMACY WILL DISPENSE MEDICATIONS LISTED ON YOUR MEDICATION LIST TO YOU DURING YOUR ADMISSION IN THE HOSPITAL!    Patients discharged on the day of surgery will not be allowed to drive home.  Someone NEEDS to stay with you for the first 24 hours after anesthesia.   Special Instructions: Bring a copy of your healthcare power of attorney and living will documents the day of surgery if you haven't scanned them before.              Please read over the following fact sheets you were given: IF YOU HAVE QUESTIONS ABOUT YOUR PRE-OP INSTRUCTIONS PLEASE CALL [phone]   If you received a COVID test during your pre-op visit   it is requested that you wear a mask when out in public, stay away from anyone that may not be feeling well and notify your surgeon if you develop symptoms. If you test positive for Covid or have been in contact with anyone that has tested positive in the last 10 days please notify you surgeon.    [facility] - Preparing for Surgery Before surgery, you can play an important role.  Because skin is not sterile, your skin needs to be as free of germs as possible.  You can reduce the number of germs on your skin by washing with CHG (chlorahexidine gluconate) soap before surgery.  CHG is an antiseptic cleaner which kills germs and bonds with the skin to continue killing germs even after washing. Please DO NOT use if you have an allergy to CHG or antibacterial soaps.  If your skin becomes reddened/irritated stop using the CHG and inform your nurse when you arrive at Short Stay. Do not shave (including legs and underarms) for at least 48 hours prior to the first CHG shower.  You may shave your face/neck. Please follow these instructions carefully:  1.  Shower with CHG Soap the night before surgery and the  morning of Surgery.  2.  If you choose to wash your hair, wash your hair first as usual with your  normal  shampoo.  3.  After you shampoo, rinse your hair and body thoroughly to remove the  shampoo.                           4.  Use CHG as you would any other liquid soap.  You can apply chg directly  to  the skin and wash                       Gently with a scrungie or clean washcloth.  5.  Apply the CHG Soap to your body ONLY FROM THE NECK DOWN.   Do not use on face/ open                           Wound or open sores. Avoid contact with eyes, ears mouth and genitals (private parts).                       Wash face,  Genitals (private parts) with your normal soap.             6.  Wash thoroughly, paying special attention to the area where your surgery  will be performed.  7.  Thoroughly rinse your body  with warm water  from the neck down.  8.  DO NOT shower/wash with your normal soap after using and rinsing off  the CHG Soap.                9.  Pat yourself dry with a clean towel.            10.  Wear clean pajamas.            11.  Place clean sheets on your bed the night of your first shower and do not  sleep with pets. Day of Surgery : Do not apply any lotions/deodorants the morning of surgery.  Please wear clean clothes to the hospital/surgery center.  FAILURE TO FOLLOW THESE INSTRUCTIONS MAY RESULT IN THE CANCELLATION OF YOUR SURGERY PATIENT SIGNATURE_________________________________  NURSE SIGNATURE__________________________________  ________________________________________________________________________

## 2024-03-18 ENCOUNTER — Other Ambulatory Visit: Payer: Self-pay

## 2024-03-18 ENCOUNTER — Encounter (HOSPITAL_COMMUNITY): Payer: Self-pay

## 2024-03-18 ENCOUNTER — Encounter (HOSPITAL_COMMUNITY)
Admission: RE | Admit: 2024-03-18 | Discharge: 2024-03-18 | Disposition: A | Discharge status: Home / Self Care | Source: Ambulatory Visit | Attending: General Surgery | Admitting: General Surgery

## 2024-03-18 VITALS — BP 148/64 | HR 54 | Temp 98.7°F | Ht 63.0 in | Wt 187.0 lb

## 2024-03-18 DIAGNOSIS — Z01818 Encounter for other preprocedural examination: Secondary | ICD-10-CM | POA: Diagnosis not present

## 2024-03-18 DIAGNOSIS — Z01810 Encounter for preprocedural cardiovascular examination: Secondary | ICD-10-CM | POA: Diagnosis present

## 2024-03-18 DIAGNOSIS — R9431 Abnormal electrocardiogram [ECG] [EKG]: Secondary | ICD-10-CM | POA: Diagnosis not present

## 2024-03-18 DIAGNOSIS — I10 Essential (primary) hypertension: Secondary | ICD-10-CM | POA: Diagnosis not present

## 2024-03-18 DIAGNOSIS — Z01812 Encounter for preprocedural laboratory examination: Secondary | ICD-10-CM | POA: Diagnosis present

## 2024-03-18 LAB — BASIC METABOLIC PANEL WITH GFR
Anion gap: 10 (ref 5–15)
BUN: 21 mg/dL (ref 8–23)
CO2: 24 mmol/L (ref 22–32)
Calcium: 9.8 mg/dL (ref 8.9–10.3)
Chloride: 105 mmol/L (ref 98–111)
Creatinine, Ser: 1.1 mg/dL — ABNORMAL HIGH (ref 0.44–1.00)
GFR, Estimated: 54 mL/min — ABNORMAL LOW (ref >60)
Glucose, Bld: 77 mg/dL (ref 70–99)
Potassium: 4 mmol/L (ref 3.5–5.1)
Sodium: 139 mmol/L (ref 135–145)

## 2024-03-18 LAB — CBC
HCT: 40.6 % (ref 36.0–46.0)
Hemoglobin: 12.9 g/dL (ref 12.0–15.0)
MCH: 29.3 pg (ref 26.0–34.0)
MCHC: 31.8 g/dL (ref 30.0–36.0)
MCV: 92.3 fL (ref 80.0–100.0)
Platelets: 132 10*3/uL — ABNORMAL LOW (ref 150–400)
RBC: 4.4 MIL/uL (ref 3.87–5.11)
RDW: 13.2 % (ref 11.5–15.5)
WBC: 3.2 10*3/uL — ABNORMAL LOW (ref 4.0–10.5)
nRBC: 0 % (ref 0.0–0.2)

## 2024-03-18 NOTE — Progress Notes (Addendum)
 For Anesthesia: PCP - Dorena Gander, MD  Cardiologist - N/A  Bowel Prep reminder:  Chest x-ray -  EKG - 03/18/24 Stress Test -  ECHO -  Cardiac Cath -  Pacemaker/ICD device last checked: Pacemaker orders received: Device Rep notified:  Spinal Cord Stimulator:N/A  Sleep Study - N/A CPAP -   Fasting Blood Sugar - N/A Checks Blood Sugar _____ times a day Date and result of last Hgb A1c-  Last dose of GLP1 agonist- N/A GLP1 instructions:   Last dose of SGLT-2 inhibitors- N/A SGLT-2 instructions:   Blood Thinner Instructions:N/A Aspirin Instructions: Last Dose:  Activity level: Can go up a flight of stairs and activities of daily living without stopping and without chest pain and/or shortness of breath   Able to exercise without chest pain and/or shortness of breath  Anesthesia review: Hx: HTN,CKD 3  Patient denies shortness of breath, fever, cough and chest pain at PAT appointment   Patient verbalized understanding of instructions that were given to them at the PAT appointment. Patient was also instructed that they will need to review over the PAT instructions again at home before surgery.

## 2024-03-30 ENCOUNTER — Ambulatory Visit (HOSPITAL_COMMUNITY): Admitting: Anesthesiology

## 2024-03-30 ENCOUNTER — Encounter (HOSPITAL_COMMUNITY): Payer: Self-pay | Admitting: General Surgery

## 2024-03-30 ENCOUNTER — Ambulatory Visit (HOSPITAL_COMMUNITY): Payer: Self-pay | Admitting: Physician Assistant

## 2024-03-30 ENCOUNTER — Ambulatory Visit (HOSPITAL_COMMUNITY)
Admission: RE | Admit: 2024-03-30 | Discharge: 2024-03-30 | Disposition: A | Discharge status: Home / Self Care | Source: Ambulatory Visit | Attending: General Surgery | Admitting: General Surgery

## 2024-03-30 ENCOUNTER — Other Ambulatory Visit: Payer: Self-pay

## 2024-03-30 ENCOUNTER — Encounter (HOSPITAL_COMMUNITY)
Admission: RE | Disposition: A | Discharge status: Home / Self Care | Payer: Self-pay | Source: Ambulatory Visit | Attending: General Surgery

## 2024-03-30 DIAGNOSIS — E210 Primary hyperparathyroidism: Secondary | ICD-10-CM | POA: Diagnosis not present

## 2024-03-30 DIAGNOSIS — D351 Benign neoplasm of parathyroid gland: Secondary | ICD-10-CM | POA: Diagnosis not present

## 2024-03-30 DIAGNOSIS — E669 Obesity, unspecified: Secondary | ICD-10-CM | POA: Diagnosis not present

## 2024-03-30 DIAGNOSIS — Z7982 Long term (current) use of aspirin: Secondary | ICD-10-CM | POA: Diagnosis not present

## 2024-03-30 DIAGNOSIS — K219 Gastro-esophageal reflux disease without esophagitis: Secondary | ICD-10-CM | POA: Insufficient documentation

## 2024-03-30 DIAGNOSIS — I10 Essential (primary) hypertension: Secondary | ICD-10-CM | POA: Insufficient documentation

## 2024-03-30 DIAGNOSIS — M1990 Unspecified osteoarthritis, unspecified site: Secondary | ICD-10-CM | POA: Diagnosis not present

## 2024-03-30 DIAGNOSIS — Z6833 Body mass index (BMI) 33.0-33.9, adult: Secondary | ICD-10-CM | POA: Diagnosis not present

## 2024-03-30 DIAGNOSIS — Z79899 Other long term (current) drug therapy: Secondary | ICD-10-CM | POA: Diagnosis not present

## 2024-03-30 HISTORY — PX: PARATHYROIDECTOMY: SHX19

## 2024-03-30 SURGERY — PARATHYROIDECTOMY
Anesthesia: General | Laterality: Right

## 2024-03-30 MED ORDER — ACETAMINOPHEN 650 MG RE SUPP
650.0000 mg | RECTAL | Status: DC | PRN
Start: 2024-03-30 — End: 2024-03-30

## 2024-03-30 MED ORDER — FENTANYL CITRATE (PF) 100 MCG/2ML IJ SOLN
INTRAMUSCULAR | Status: AC
  Filled 2024-03-30: qty 2

## 2024-03-30 MED ORDER — ACETAMINOPHEN 500 MG PO TABS
1000.0000 mg | ORAL_TABLET | ORAL | Status: AC
  Administered 2024-03-30: 1000 mg via ORAL
  Filled 2024-03-30: qty 2

## 2024-03-30 MED ORDER — FENTANYL CITRATE PF 50 MCG/ML IJ SOSY: 25.0000 ug | PREFILLED_SYRINGE | INTRAMUSCULAR | Status: DC | PRN

## 2024-03-30 MED ORDER — SODIUM CHLORIDE 0.9% FLUSH: 3.0000 mL | INTRAVENOUS | Status: DC | PRN

## 2024-03-30 MED ORDER — BUPIVACAINE HCL (PF) 0.25 % IJ SOLN
INTRAMUSCULAR | Status: AC
Start: 2024-03-30 — End: ?
  Filled 2024-03-30: qty 30

## 2024-03-30 MED ORDER — CHLORHEXIDINE GLUCONATE 0.12 % MT SOLN: 15.0000 mL | Freq: Once | OROMUCOSAL | Status: DC

## 2024-03-30 MED ORDER — CEFAZOLIN SODIUM-DEXTROSE 2-4 GM/100ML-% IV SOLN
2.0000 g | INTRAVENOUS | Status: AC
  Administered 2024-03-30: 2 g via INTRAVENOUS
  Filled 2024-03-30: qty 100

## 2024-03-30 MED ORDER — BUPIVACAINE-EPINEPHRINE (PF) 0.25% -1:200000 IJ SOLN
INTRAMUSCULAR | Status: AC
  Filled 2024-03-30: qty 30

## 2024-03-30 MED ORDER — MIDAZOLAM HCL 5 MG/5ML IJ SOLN
INTRAMUSCULAR | Status: DC | PRN
  Administered 2024-03-30: 1 mg via INTRAVENOUS

## 2024-03-30 MED ORDER — LIDOCAINE HCL (PF) 2 % IJ SOLN
INTRAMUSCULAR | Status: AC
  Filled 2024-03-30: qty 5

## 2024-03-30 MED ORDER — PHENYLEPHRINE 80 MCG/ML (10ML) SYRINGE FOR IV PUSH (FOR BLOOD PRESSURE SUPPORT)
PREFILLED_SYRINGE | INTRAVENOUS | Status: AC
  Filled 2024-03-30: qty 10

## 2024-03-30 MED ORDER — HYDROMORPHONE HCL 1 MG/ML IJ SOLN: 0.2500 mg | INTRAMUSCULAR | Status: DC | PRN

## 2024-03-30 MED ORDER — STERILE WATER FOR IRRIGATION IR SOLN
Status: DC | PRN
  Administered 2024-03-30: 1000 mL

## 2024-03-30 MED ORDER — DEXMEDETOMIDINE HCL IN NACL 200 MCG/50ML IV SOLN
INTRAVENOUS | Status: DC | PRN
Start: 2024-03-30 — End: 2024-03-30
  Administered 2024-03-30 (×2): 4 ug via INTRAVENOUS

## 2024-03-30 MED ORDER — DEXAMETHASONE SODIUM PHOSPHATE 10 MG/ML IJ SOLN
INTRAMUSCULAR | Status: AC
  Filled 2024-03-30: qty 1

## 2024-03-30 MED ORDER — SODIUM CHLORIDE 0.9% FLUSH: 3.0000 mL | Freq: Two times a day (BID) | INTRAVENOUS | Status: DC

## 2024-03-30 MED ORDER — 0.9 % SODIUM CHLORIDE (POUR BTL) OPTIME
TOPICAL | Status: DC | PRN
  Administered 2024-03-30: 1000 mL

## 2024-03-30 MED ORDER — AMISULPRIDE (ANTIEMETIC) 5 MG/2ML IV SOLN: 10.0000 mg | Freq: Once | INTRAVENOUS | Status: DC | PRN

## 2024-03-30 MED ORDER — SUGAMMADEX SODIUM 200 MG/2ML IV SOLN
INTRAVENOUS | Status: DC | PRN
  Administered 2024-03-30: 200 mg via INTRAVENOUS

## 2024-03-30 MED ORDER — LACTATED RINGERS IV SOLN: INTRAVENOUS | Status: DC

## 2024-03-30 MED ORDER — OXYCODONE HCL 5 MG/5ML PO SOLN
ORAL | Status: AC
  Filled 2024-03-30: qty 5

## 2024-03-30 MED ORDER — SODIUM CHLORIDE 0.9 % IV SOLN: 250.0000 mL | INTRAVENOUS | Status: DC | PRN

## 2024-03-30 MED ORDER — ACETAMINOPHEN 325 MG PO TABS
650.0000 mg | ORAL_TABLET | ORAL | Status: DC | PRN
Start: 2024-03-30 — End: 2024-03-30

## 2024-03-30 MED ORDER — OXYCODONE HCL 5 MG PO TABS: 5.0000 mg | ORAL_TABLET | Freq: Once | ORAL | Status: AC | PRN

## 2024-03-30 MED ORDER — ROCURONIUM BROMIDE 10 MG/ML (PF) SYRINGE
PREFILLED_SYRINGE | INTRAVENOUS | Status: AC
  Filled 2024-03-30: qty 10

## 2024-03-30 MED ORDER — OXYCODONE HCL 5 MG PO TABS: 5.0000 mg | ORAL_TABLET | ORAL | Status: DC | PRN

## 2024-03-30 MED ORDER — PROPOFOL 10 MG/ML IV BOLUS
INTRAVENOUS | Status: AC
  Filled 2024-03-30: qty 20

## 2024-03-30 MED ORDER — CHLORHEXIDINE GLUCONATE CLOTH 2 % EX PADS: 6.0000 | MEDICATED_PAD | Freq: Once | CUTANEOUS | Status: DC

## 2024-03-30 MED ORDER — EPHEDRINE SULFATE (PRESSORS) 50 MG/ML IJ SOLN
INTRAMUSCULAR | Status: DC | PRN
  Administered 2024-03-30: 10 mg via INTRAVENOUS
  Administered 2024-03-30: 5 mg via INTRAVENOUS

## 2024-03-30 MED ORDER — ACETAMINOPHEN 325 MG PO TABS: 650.0000 mg | ORAL_TABLET | Freq: Four times a day (QID) | ORAL | 0 refills | Status: AC

## 2024-03-30 MED ORDER — DEXMEDETOMIDINE HCL IN NACL 80 MCG/20ML IV SOLN
INTRAVENOUS | Status: AC
  Filled 2024-03-30: qty 20

## 2024-03-30 MED ORDER — OXYCODONE HCL 5 MG/5ML PO SOLN
5.0000 mg | Freq: Once | ORAL | Status: AC | PRN
  Administered 2024-03-30: 5 mg via ORAL

## 2024-03-30 MED ORDER — ORAL CARE MOUTH RINSE: 15.0000 mL | Freq: Once | OROMUCOSAL | Status: DC

## 2024-03-30 MED ORDER — SODIUM CHLORIDE 0.9 % IV SOLN: 12.5000 mg | INTRAVENOUS | Status: DC | PRN

## 2024-03-30 MED ORDER — OXYCODONE HCL 5 MG PO TABS: 5.0000 mg | ORAL_TABLET | Freq: Three times a day (TID) | ORAL | 0 refills | Status: AC | PRN

## 2024-03-30 MED ORDER — MIDAZOLAM HCL 2 MG/2ML IJ SOLN
INTRAMUSCULAR | Status: AC
  Filled 2024-03-30: qty 2

## 2024-03-30 MED ORDER — BUPIVACAINE HCL 0.25 % IJ SOLN
INTRAMUSCULAR | Status: DC | PRN
  Administered 2024-03-30: 10 mL

## 2024-03-30 MED ORDER — PHENYLEPHRINE HCL (PRESSORS) 10 MG/ML IV SOLN
INTRAVENOUS | Status: DC | PRN
Start: 2024-03-30 — End: 2024-03-30
  Administered 2024-03-30: 80 ug via INTRAVENOUS

## 2024-03-30 MED ORDER — ROCURONIUM BROMIDE 100 MG/10ML IV SOLN
INTRAVENOUS | Status: DC | PRN
  Administered 2024-03-30: 50 mg via INTRAVENOUS
  Administered 2024-03-30: 20 mg via INTRAVENOUS

## 2024-03-30 MED ORDER — FENTANYL CITRATE (PF) 100 MCG/2ML IJ SOLN
INTRAMUSCULAR | Status: DC | PRN
  Administered 2024-03-30: 50 ug via INTRAVENOUS
  Administered 2024-03-30 (×3): 25 ug via INTRAVENOUS

## 2024-03-30 MED ORDER — DEXAMETHASONE SODIUM PHOSPHATE 10 MG/ML IJ SOLN
INTRAMUSCULAR | Status: DC | PRN
  Administered 2024-03-30: 4 mg via INTRAVENOUS

## 2024-03-30 MED ORDER — LIDOCAINE HCL (CARDIAC) PF 100 MG/5ML IV SOSY
PREFILLED_SYRINGE | INTRAVENOUS | Status: DC | PRN
  Administered 2024-03-30: 50 mg via INTRAVENOUS

## 2024-03-30 MED ORDER — IBUPROFEN 200 MG PO TABS: 600.0000 mg | ORAL_TABLET | Freq: Four times a day (QID) | ORAL | 0 refills | Status: AC

## 2024-03-30 MED ORDER — ONDANSETRON HCL 4 MG/2ML IJ SOLN
INTRAMUSCULAR | Status: AC
  Filled 2024-03-30: qty 2

## 2024-03-30 MED ORDER — PROPOFOL 10 MG/ML IV BOLUS
INTRAVENOUS | Status: DC | PRN
  Administered 2024-03-30: 90 mg via INTRAVENOUS

## 2024-03-30 MED ORDER — ONDANSETRON HCL 4 MG/2ML IJ SOLN
INTRAMUSCULAR | Status: DC | PRN
  Administered 2024-03-30: 4 mg via INTRAVENOUS

## 2024-03-30 MED ORDER — HEMOSTATIC AGENTS (NO CHARGE) OPTIME
TOPICAL | Status: DC | PRN
  Administered 2024-03-30: 1 via TOPICAL

## 2024-03-30 SURGICAL SUPPLY — 37 items
ATTRACTOMAT 16X20 MAGNETIC DRP (DRAPES) ×1 IMPLANT
BAG COUNTER SPONGE SURGICOUNT (BAG) ×1 IMPLANT
BLADE SURG 15 STRL LF DISP TIS (BLADE) ×1 IMPLANT
CHLORAPREP W/TINT 26 (MISCELLANEOUS) ×1 IMPLANT
CLIP TI MEDIUM 6 (CLIP) ×2 IMPLANT
CLIP TI WIDE RED SMALL 6 (CLIP) ×2 IMPLANT
COVER SURGICAL LIGHT HANDLE (MISCELLANEOUS) ×1 IMPLANT
DERMABOND ADVANCED .7 DNX12 (GAUZE/BANDAGES/DRESSINGS) ×1 IMPLANT
DRAPE LAPAROTOMY T 98X78 PEDS (DRAPES) ×1 IMPLANT
DRAPE UTILITY XL STRL (DRAPES) ×1 IMPLANT
ELECT REM PT RETURN 15FT ADLT (MISCELLANEOUS) ×1 IMPLANT
GAUZE 4X4 16PLY ~~LOC~~+RFID DBL (SPONGE) ×1 IMPLANT
GAUZE SPONGE 4X4 12PLY STRL (GAUZE/BANDAGES/DRESSINGS) IMPLANT
GLOVE BIOGEL PI IND STRL 7.0 (GLOVE) IMPLANT
GLOVE SURG ORTHO 8.0 STRL STRW (GLOVE) ×1 IMPLANT
GLOVE SURG SYN 7.0 (GLOVE) ×1 IMPLANT
GLOVE SURG SYN 7.0 PF PI (GLOVE) IMPLANT
GLOVE SURG SYN 8.0 (GLOVE) ×1 IMPLANT
GLOVE SURG SYN 8.0 PF PI (GLOVE) IMPLANT
GOWN STRL REUS W/ TWL XL LVL3 (GOWN DISPOSABLE) ×3 IMPLANT
HEMOSTAT SURGICEL 2X4 FIBR (HEMOSTASIS) ×1 IMPLANT
ILLUMINATOR WAVEGUIDE N/F (MISCELLANEOUS) IMPLANT
KIT BASIN OR (CUSTOM PROCEDURE TRAY) ×1 IMPLANT
KIT TURNOVER KIT A (KITS) IMPLANT
NDL HYPO 22X1.5 SAFETY MO (MISCELLANEOUS) ×1 IMPLANT
NEEDLE HYPO 22X1.5 SAFETY MO (MISCELLANEOUS) ×1 IMPLANT
PACK BASIC VI WITH GOWN DISP (CUSTOM PROCEDURE TRAY) ×1 IMPLANT
PENCIL SMOKE EVACUATOR (MISCELLANEOUS) ×1 IMPLANT
SHEARS HARMONIC 9CM CVD (BLADE) IMPLANT
SUT MNCRL AB 4-0 PS2 18 (SUTURE) ×1 IMPLANT
SUT VIC AB 3-0 SH 18 (SUTURE) ×1 IMPLANT
SUT VICRYL 3-0 CR8 SH (SUTURE) IMPLANT
SYR 10ML LL (SYRINGE) IMPLANT
SYR BULB IRRIG 60ML STRL (SYRINGE) ×1 IMPLANT
SYR CONTROL 10ML LL (SYRINGE) ×1 IMPLANT
TOWEL OR 17X26 10 PK STRL BLUE (TOWEL DISPOSABLE) ×1 IMPLANT
TUBING CONNECTING 10 (TUBING) ×1 IMPLANT

## 2024-03-30 NOTE — Transfer of Care (Signed)
 Immediate Anesthesia Transfer of Care Note  Patient: Teresa Collins  Procedure(s) Performed: PARATHYROIDECTOMY (Right)  Patient Location: PACU  Anesthesia Type:General  Level of Consciousness: awake, alert , oriented, and patient cooperative  Airway & Oxygen Therapy: Patient Spontanous Breathing, Patient connected to nasal cannula oxygen, and Patient connected to face mask oxygen  Post-op Assessment: Report given to RN and Post -op Vital signs reviewed and stable  Post vital signs: Reviewed and stable  Last Vitals:  Vitals Value Taken Time  BP 153/68 03/30/24 1300  Temp    Pulse 62 03/30/24 1304  Resp 13 03/30/24 1303  SpO2 100 % 03/30/24 1304  Vitals shown include unfiled device data.  Last Pain:  Vitals:   03/30/24 0924  TempSrc: Oral  PainSc:          Complications: No notable events documented.

## 2024-03-30 NOTE — Anesthesia Postprocedure Evaluation (Signed)
 Anesthesia Post Note  Patient: Teresa Collins  Procedure(s) Performed: PARATHYROIDECTOMY (Right)     Patient location during evaluation: PACU Anesthesia Type: General Level of consciousness: awake and alert Pain management: pain level controlled Vital Signs Assessment: post-procedure vital signs reviewed and stable Respiratory status: spontaneous breathing, nonlabored ventilation and respiratory function stable Cardiovascular status: blood pressure returned to baseline and stable Postop Assessment: no apparent nausea or vomiting Anesthetic complications: no   No notable events documented.  Last Vitals:  Vitals:   03/30/24 1330 03/30/24 1345  BP: (!) 171/76 (!) 160/78  Pulse: (!) 53 (!) 51  Resp: 16 18  Temp:  36.6 C  SpO2: 100% 98%    Last Pain:  Vitals:   03/30/24 1345  TempSrc:   PainSc: 3                  Earvin Goldberg

## 2024-03-30 NOTE — Anesthesia Preprocedure Evaluation (Addendum)
 Anesthesia Evaluation  Patient identified by MRN, date of birth, ID band Patient awake    Reviewed: Allergy & Precautions, NPO status , Patient's Chart, lab work & pertinent test results  Airway Mallampati: II  TM Distance: >3 FB Neck ROM: Full    Dental  (+) Dental Advisory Given   Pulmonary neg pulmonary ROS   breath sounds clear to auscultation       Cardiovascular hypertension, Pt. on medications  Rhythm:Regular Rate:Normal     Neuro/Psych negative neurological ROS     GI/Hepatic Neg liver ROS,GERD  ,,  Endo/Other  negative endocrine ROS    Renal/GU Renal disease     Musculoskeletal  (+) Arthritis , Osteoarthritis,    Abdominal  (+) + obese  Peds  Hematology negative hematology ROS (+)   Anesthesia Other Findings   Reproductive/Obstetrics                             Lab Results  Component Value Date   WBC 3.2 (L) 03/18/2024   HGB 12.9 03/18/2024   HCT 40.6 03/18/2024   MCV 92.3 03/18/2024   PLT 132 (L) 03/18/2024   Lab Results  Component Value Date   CREATININE 1.10 (H) 03/18/2024   BUN 21 03/18/2024   NA 139 03/18/2024   K 4.0 03/18/2024   CL 105 03/18/2024   CO2 24 03/18/2024    Anesthesia Physical Anesthesia Plan  ASA: 2  Anesthesia Plan: General   Post-op Pain Management: Tylenol  PO (pre-op)*   Induction: Intravenous  PONV Risk Score and Plan: 3 and Dexamethasone , Ondansetron , Treatment may vary due to age or medical condition and Midazolam   Airway Management Planned: Oral ETT  Additional Equipment: None  Intra-op Plan:   Post-operative Plan: Extubation in OR  Informed Consent: I have reviewed the patients History and Physical, chart, labs and discussed the procedure including the risks, benefits and alternatives for the proposed anesthesia with the patient or authorized representative who has indicated his/her understanding and acceptance.      Dental advisory given  Plan Discussed with:   Anesthesia Plan Comments:         Anesthesia Quick Evaluation

## 2024-03-30 NOTE — Anesthesia Procedure Notes (Signed)
 Procedure Name: Intubation Date/Time: 03/30/2024 11:22 AM  Performed by: Patt Boozer, CRNAPre-anesthesia Checklist: Patient identified, Emergency Drugs available, Suction available and Patient being monitored Patient Re-evaluated:Patient Re-evaluated prior to induction Oxygen Delivery Method: Circle system utilized Preoxygenation: Pre-oxygenation with 100% oxygen Induction Type: IV induction Ventilation: Mask ventilation without difficulty Laryngoscope Size: Mac and 3 Grade View: Grade II Tube type: Oral Tube size: 7.0 mm Number of attempts: 1 Airway Equipment and Method: Stylet and Oral airway Placement Confirmation: ETT inserted through vocal cords under direct vision, positive ETCO2 and breath sounds checked- equal and bilateral Secured at: 21 cm Tube secured with: Tape Dental Injury: Teeth and Oropharynx as per pre-operative assessment

## 2024-03-30 NOTE — Op Note (Signed)
 OPERATIVE REPORT - PARATHYROIDECTOMY  Preoperative diagnosis: Primary hyperparathyroidism  Postop diagnosis: Same  Procedure: Right minimally invasive superior parathyroidectomy  Surgeon:  Armond Bertin, MD  Assist: Oralee Billow, mD  Anesthesia: General endotracheal  Estimated blood loss: Minimal  Preparation: ChloraPrep  Indications: 73 y/o F with signs of primary hyperparathyroidism and imaging to suggest localization to the right side. I offered her a parathyroidectomy. I addressed all of her questions and concerns and written consent was obtained.   Procedure: The patient was prepared in the pre-operative holding area. The patient was brought to the operating room and placed in a supine position on the operating room table. Following administration of general anesthesia, the patient was positioned and then prepped and draped in the usual strict aseptic fashion. After ascertaining that an adequate level of anesthesia been achieved, a neck incision was made with a #15 blade. Dissection was carried through subcutaneous tissues and platysma. Hemostasis was obtained with the electrocautery. Skin flaps were developed circumferentially and a Weitlander retractor was placed for exposure.  Strap muscles were incised in the midline. Strap muscles were reflected laterally exposing the right thyroid  lobe. With gentle blunt dissection the thyroid  lobe was mobilized.  We initially freed up the inferior aspect of the lobe, identifying a normal appearing parathyroid  gland. Dissection was then carried posteriorly and superior and an enlarged parathyroid  gland was identified. It was gently mobilized. Vascular structures were divided between small ligaclips. Care was taken to avoid the recurrent laryngeal nerve. The parathyroid  gland was completely excised. It was submitted to pathology where frozen section confirmed hypercellular parathyroid  tissue consistent with adenoma.  Neck was irrigated with warm  saline and good hemostasis was noted. Fibrillar was placed in the operative field. Strap muscles were approximated in the midline with interrupted 3-0 Vicryl sutures. Platysma was closed with interrupted 3-0 Vicryl sutures. Marcaine  was infiltrated circumferentially. Skin was closed with a running 4-0 Monocryl subcuticular suture. Wound was washed and dried and Dermabond was applied. Patient was awakened from anesthesia and brought to the recovery room. The patient tolerated the procedure well.   Armond Bertin, MD [facility] Office: [phone]

## 2024-03-30 NOTE — H&P (Signed)
 Teresa Collins Teresa Collins Apr 05, 1951  [id number].    HPI:  73 y/o F w/ a hx of elevated calcium  with workup concerning for primary hyperparathyroidism. She presents today for elective parathyroidectomy. She is in her usual state of health and denies any recent changes in medication.   ROS: Review of Systems  Constitutional: Negative.   HENT: Negative.    Eyes: Negative.   Respiratory: Negative.    Cardiovascular: Negative.   Gastrointestinal: Negative.   Genitourinary: Negative.   Musculoskeletal: Negative.   Skin: Negative.   Neurological: Negative.   Endo/Heme/Allergies: Negative.   Psychiatric/Behavioral: Negative.      Family History  Problem Relation Age of Onset   Hypertension Mother    Hypertension Sister    Hypertension Brother    Colon cancer Neg Hx    Esophageal cancer Neg Hx    Rectal cancer Neg Hx    Stomach cancer Neg Hx    Breast cancer Neg Hx    Colon polyps Neg Hx     Past Medical History:  Diagnosis Date   Arthritis    ASCUS favor benign 12/2015   negative high risk HPV. Recommend repeat Pap smear in one year   ASCUS of cervix with negative high risk HPV 12/2018   Eczema    GERD (gastroesophageal reflux disease)    Hx of colonic polyps    Hyperlipidemia    Hypertension    Incisional hernia    Numbness and tingling    RT THIGH   Renal cancer (HCC) 2015   Left   STD (sexually transmitted disease)    GC   Vertigo     Past Surgical History:  Procedure Laterality Date   abortions     CARPAL TUNNEL RELEASE Left 2017   CATARACT EXTRACTION, BILATERAL Bilateral    CESAREAN SECTION     1 time   INSERTION OF MESH N/A 07/21/2014   Procedure: INSERTION OF MESH;  Surgeon: Oza Blumenthal, MD;  Location: [facility];  Service: General;  Laterality: N/A;   MYOMECTOMY ABDOMINAL APPROACH     NECK SURGERY  11/2021   POSTERIOR CERVICAL FUSION/FORAMINOTOMY N/A 12/07/2021   Procedure: Cervical Four-Cervical Seven Laminectomies/ Cervical Four -Thoracic One  Posterior instrumented spinal fusion;  Surgeon: Cannon Champion, MD;  Location: [facility];  Service: Neurosurgery;  Laterality: N/A;  Cervical Four-Cervical Seven Laminectomies/ Cervical Four -Thoracic One Posterior instrumented spinal fusion   ROBOTIC ASSITED PARTIAL NEPHRECTOMY Left 07/21/2014   Procedure: ROBOTIC ASSISTED LEFT PARTIAL NEPHRECTOMY;  Surgeon: Andrez Banker, MD;  Location: [facility];  Service: Urology;  Laterality: Left;   TUBAL LIGATION     VENTRAL HERNIA REPAIR N/A 07/21/2014   Procedure: INCISIONAL HERNIA REPAIR WITH MESH ;  Surgeon: Oza Blumenthal, MD;  Location: [facility];  Service: General;  Laterality: N/A;    Social History:  reports that she has never smoked. She has never used smokeless tobacco. She reports that she does not currently use alcohol. She reports that she does not use drugs.  Allergies:  Allergies  Allergen Reactions   Amlodipine  Other (See Comments)    headache   Lipitor [Atorvastatin] Other (See Comments)    Muscle aches   Bystolic  [Nebivolol  Hcl] Swelling   Codeine Sulfate Hives    REACTION: rash and itching   Gabapentin     Severe Headache    Latex Rash    Medications Prior to Admission  Medication Sig Dispense Refill   acetaminophen  (TYLENOL ) 500 MG tablet Take 1,000  mg by mouth every 6 (six) hours as needed for headache.     aspirin 81 MG chewable tablet Chew 81 mg by mouth in the morning.     clobetasol  ointment (TEMOVATE ) 0.05 % 1 application 2 (two) times daily as needed (eczema).     Cyanocobalamin  (VITAMIN B 12) 500 MCG TABS Take 500 mcg by mouth in the morning.     fluticasone  (FLONASE ) 50 MCG/ACT nasal spray Place 1 spray into both nostrils daily as needed for allergies.     levocetirizine (XYZAL ) 5 MG tablet Take 5 mg by mouth every evening.     lisinopril  (ZESTRIL ) 40 MG tablet Take 40 mg by mouth every evening.     psyllium (REGULOID) 0.52 g capsule Take 0.52 g by mouth as needed.     rosuvastatin  (CRESTOR ) 20 MG tablet Take  20 mg by mouth in the morning.      Physical Exam: Blood pressure (!) 146/83, pulse (!) 53, temperature 98 F (36.7 C), temperature source Oral, resp. rate 16, height 5\' 3"  (1.6 m), weight 84.8 kg, SpO2 98%. Gen: female, NAD HEENT: No lymphadenopathy, trachea midline  No results found for this or any previous visit (from the past 48 hours). No results found.  Assessment/Plan 73 y/o F w/ concern for primary hyperparathyroidism with localization to a right parathyroid  gland.   - Will proceed to the OR. We discussed the alternatives and potential risks of surgery, including but not limited to: bleeding, infection, damage to the RLN, hoarseness, hypocalcemia, damage to the thyroid  gland, and need for additional procedures. All questions were addressed and consent was obtained.    [facility] 03/30/2024, 10:23 AM Please see Amion for pager number during day hours 7:00am-4:30pm or 7:00am -11:30am on weekends

## 2024-03-30 NOTE — Op Note (Signed)
 Outpatient Parathyroid  Surgery Home Care Instruction  Activity  The effects of anesthesia are still present and drowsiness may result.  Limit activity for the first 24 hours, then you may return to normal daily activities. Returning to normal daily activities as soon as you can following surgery will enhance recovery time.  Do not drive or operate heavy machinery within 24 hours of taking narcotic pain medications.   Do not mow the lawn, use a vacuum cleaner, or do any other strenuous activities without first consulting your surgical team.   Diet Drink plenty of fluids and eat light meals today, then resume regular diet. Some patients may find their appetite is poor for a week or two after surgery. This is a normal result of the stress of surgery-your appetite will return in time.   There are no specific diet restrictions after surgery.   Dressing and Wound Care  Keep your wound or incision site clean and dry. Most patients will experience some swelling and bruising on the chest and neck area.  Ice packs will help for the first 48 hours after arriving home.  Swelling and bruising will take several days to resolve.   You may have different types of dressings covering your incisions depending on your operation and your surgeon: Dermabond/Durabond (skin glue): This will usually remain in place for 10-14 days, then naturally fall off your skin. You may take a shower 24 hrs after surgery, carefully wash, not scrub the incision site with a mild non-scented soap. Pat dry with a soft towel.  Do not pick or peel skin glue off.  You can shower and let the water  fall on the dressings above. Do not soak or submerge your incision(s) in a bath tub, hot tub, or swimming pool, until your doctor says it is ok to do so or the incision(s) have completely healed, usually about 2-4 weeks.  Do not use creams, powder, salves or balms on your incision(s).  What to Expect After Surgery   Moderate discomfort controlled  with medications  Minimal drainage from incision  Feeling fatigue and weak  Constipation after surgery is common. Drink plenty fluids and eat a high fiber diet.   Pain Control: Prescribed Non-Narcotic Pain Medication  You will be given three prescriptions.  Two of them will be for prescription strength ibuprofen  (i.e. Advil ) and prescription strength acetaminophen  (i.e. Tylenol ).  The vast majority of patients will just need these two medications.  One prescription will be for a 'rescue' prescription of an oral narcotic (oxycodone ).  You may fill this if needed.  You will alternate taking the ibuprofen  (600mg ) every 6 hours and also the acetaminophen  (650mg ) every 6 hours so that you are taking one of those medications every 3 hours.  For example: o 0800 - take ibuprofen  600mg  o 1100 - take acetaminophen  650mg  o 1400 - take ibuprofen  600mg  o 1700 - take acetaminophen  650mg  o Etc.  Continue taking this alternating pattern of ibuprofen  and acetaminophen  for 3 days  If you cannot take one or the other of these medications, just take the one you can every 6 hours.  If you are comfortable at night, you don't have to wake up and take a medication.  If you are still uncomfortable after taking either ibuprofen  or acetaminophen , try gentle stretching exercise and ice packs (a bag of frozen vegetables works great).  If you are still uncomfortable, you may fill the narcotic prescription of Oxycodone  and take as directed.  Once you have completed these  prescriptions, your pain level should be low enough to stop taking medications altogether or just use an over the counter medication (ibuprofen  or acetaminophen ) as needed.    Pain Control: Over the Counter Medications to take as needed  Colace/Docusate: May be prescribed by your surgeon to prevent constipation caused by the combination of narcotics, effects of anesthesia, and decreased ambulation.  Hold for loose stools or diarrhea. Take 100 mg 1-2 times a  day starting tonight.   Fiber: High fiber foods, extra liquids (water  9-13 cups/day) can also assist with constipation. Examples of high fiber foods are fruit, bran. Prune juice and water  are also good liquids to drink.  Milk of Magnesia/Miralax :  If constipated despite takeing the over the counter stool softeners, you may take Milk of Magnesia or Miralax  as directed on bottle to assist with constipation.     Pepcid /Famotidine : May be prescribed while taking naproxen (Aleve) or other NSAIDs such as ibuprofen  (Motrin /Advil ) to prevent stomach upset or Acid-reflux symptoms. Take 1 tablet 1-2 times a day.   **Constipation: The first bowel movement may occur anywhere between 1-5 days after surgery.  As long as you are not nauseated or not having significant abdominal pain this variation is acceptable. Narcotic pain medications can cause constipation increasing discomfort; early discontinuation will assist with bowel management. If constipated despite taking stool softeners, you may take Milk of Magnesia or Miralax  as directed on the bottle.     **Home medications: You may restart your home medications as directed by your respective Primary Care Physician or Surgeon.   When to notify your Doctor or Healthcare Team   Sign of Wound Infection   Fever over 100 degrees.  Wound becomes extremely swollen, shows red streaks, warm to the touch, and/or drainage from the incision site or foul-smelling drainage.  Wound edges separate or opens up  Bleeding or bruising   If you have bleeding, apply pressure to the site and hold the pressure firmly for 5 minutes. If the bleeding continues, apply pressure again and call 911. If the bleeding stopped, call your doctor to report it.   Call your doctor or nurse if you have increased bleeding from your site and increased bruising or a lump forms or gets larger under your skin at the site. Unrelieved Pain   Call your doctor or nurse if your pain gets worse or is not eased 1  hour after taking your pain medicine, or if it is severe and uncontrolled. Nausea and Vomiting   Call your doctor or nurse if you have nausea and vomiting that continues more than 24 hours, will not let you keep medicine down and will not let you keep fluids down  Fever, Flu-like symptoms   Fever over 100 degrees and/or chills  Gastrointestinal Bleeding Symptoms    Black tarry bowel movements.  This can be normal after surgery on the stomach, but should resolve in a day or two.    Call 911 if you suddenly have signs of blood loss such as:  Vomiting blood  Fast heart rate  Feeling faint, sweaty, or blacking out  Passing bright red blood from your rectum  Blood Clot Symptoms   Tender, swollen or reddened areas in your calf muscle or thighs.  Numbness or tingling in your lower leg or calf, or at the top of your leg or groin  Skin on your leg looks pale or blue or feels cold to touch  Chest pain or have trouble breathing, lightheadedness, fast heart rate  Sudden Onset of Symptoms    Call 911 if you suddenly have:  Leg weakness and spasm  Loss of bladder or bowel function  Seizure  Confusion, severe headache, dizziness or feeling unsteady, problems talking, difficulty swallowing, and/or numbness or muscle weakness as these could be signs of a stroke.  Follow up Appointment Your follow up appointment should be scheduled 2-3 weeks after your surgery date.  If you have not previously scheduled for a follow-up visit you can be scheduled by contacting [phone].

## 2024-03-31 ENCOUNTER — Encounter (HOSPITAL_COMMUNITY): Payer: Self-pay | Admitting: General Surgery

## 2024-03-31 LAB — SURGICAL PATHOLOGY

## 2024-04-14 NOTE — Progress Notes (Unsigned)
 Ellouise Console, PA-C [address] [place], [place]  [address] Phone: [phone]   Primary Care Physician: Rolinda Millman, MD  Primary Gastroenterologist:  Ellouise Console, PA-C / Norleen Kiang, MD   Chief Complaint: Follow-up fecal incontinence       HPI:   Teresa Collins is a 73 y.o. female returns for 9-month follow-up of fecal incontinence.  She initially started Benefiber for mild constipation which made incontinence worse.  2 months ago she was switched to FiberCon 2 tablets once or twice daily.  Instructed to do Kegel exercises.  FiberCon did not work well and she started taking Metamucil 2 tablespoons once daily.  This treatment is working extremely well.  Currently she is having normal formed bowel movement once daily with no more incontinence.  No GI concerns today.  Rectal exam 2 months ago (01/31/2024) showed: Rectal: No external hemorrhoids, rashes, anal fissure, or masses.  Decreased anal sphincter tone.  No evidence of rectal prolapse with bearing down.  Anoscopy: Small internal hemorrhoids, No bleeding, No rectal lesions, brown soft stool in vault.  GI History: IBS, GERD, history of multiple adenomatous colon polyps and adenoma with villous component.  Has had 4 colonoscopies since 2012.   02/2022 last Colonoscopy by Dr. Kiang: 4 small (2 mm to 4 mm) tubular adenoma polyps removed.  No dysplasia.  Excellent prep with Suprep.  Internal hemorrhoids.  Diverticulosis.  5-year repeat will be due 02/2027.  Current Outpatient Medications  Medication Sig Dispense Refill   aspirin 81 MG chewable tablet Chew 81 mg by mouth in the morning.     clobetasol  ointment (TEMOVATE ) 0.05 % 1 application 2 (two) times daily as needed (eczema).     Cyanocobalamin  (VITAMIN B 12) 500 MCG TABS Take 500 mcg by mouth in the morning.     fluticasone  (FLONASE ) 50 MCG/ACT nasal spray Place 1 spray into both nostrils daily as needed for allergies.     levocetirizine (XYZAL ) 5 MG tablet Take 5 mg  by mouth every evening.     lisinopril  (ZESTRIL ) 40 MG tablet Take 40 mg by mouth every evening.     psyllium (METAMUCIL) 58.6 % packet Take 1 packet by mouth daily.     rosuvastatin  (CRESTOR ) 20 MG tablet Take 20 mg by mouth in the morning.     No current facility-administered medications for this visit.    Allergies as of 04/15/2024 - Review Complete 04/15/2024  Allergen Reaction Noted   Amlodipine  Other (See Comments) 12/30/2018   Lipitor [atorvastatin] Other (See Comments) 08/13/2013   Bystolic  [nebivolol  hcl] Swelling 12/26/2016   Codeine sulfate Hives 11/16/2009   Gabapentin  11/29/2021   Latex Rash 10/16/2011    Past Medical History:  Diagnosis Date   Arthritis    ASCUS favor benign 12/2015   negative high risk HPV. Recommend repeat Pap smear in one year   ASCUS of cervix with negative high risk HPV 12/2018   Eczema    GERD (gastroesophageal reflux disease)    Hx of colonic polyps    Hyperlipidemia    Hypertension    Incisional hernia    Numbness and tingling    RT THIGH   Renal cancer (HCC) 2015   Left   STD (sexually transmitted disease)    GC   Vertigo     Past Surgical History:  Procedure Laterality Date   abortions     CARPAL TUNNEL RELEASE Left 2017   CATARACT EXTRACTION, BILATERAL Bilateral  CESAREAN SECTION     1 time   INSERTION OF MESH N/A 07/21/2014   Procedure: INSERTION OF MESH;  Surgeon: Vicenta Poli, MD;  Location: [facility];  Service: General;  Laterality: N/A;   MYOMECTOMY ABDOMINAL APPROACH     NECK SURGERY  11/2021   PARATHYROIDECTOMY Right 03/30/2024   Procedure: PARATHYROIDECTOMY;  Surgeon: Polly Cordella LABOR, MD;  Location: [facility];  Service: General;  Laterality: Right;  RIGHT PARATHYROIDECTOMY, POSSIBLE SUBTOTAL PARATHYROIDECTOMY   POSTERIOR CERVICAL FUSION/FORAMINOTOMY N/A 12/07/2021   Procedure: Cervical Four-Cervical Seven Laminectomies/ Cervical Four -Thoracic One Posterior instrumented spinal fusion;  Surgeon: Cheryle Debby LABOR, MD;  Location: [facility];  Service: Neurosurgery;  Laterality: N/A;  Cervical Four-Cervical Seven Laminectomies/ Cervical Four -Thoracic One Posterior instrumented spinal fusion   ROBOTIC ASSITED PARTIAL NEPHRECTOMY Left 07/21/2014   Procedure: ROBOTIC ASSISTED LEFT PARTIAL NEPHRECTOMY;  Surgeon: Morene LELON Salines, MD;  Location: [facility];  Service: Urology;  Laterality: Left;   TUBAL LIGATION     VENTRAL HERNIA REPAIR N/A 07/21/2014   Procedure: INCISIONAL HERNIA REPAIR WITH MESH ;  Surgeon: Vicenta Poli, MD;  Location: [facility];  Service: General;  Laterality: N/A;    Review of Systems:    All systems reviewed and negative except where noted in HPI.    Physical Exam:  BP 128/70   Ht 5' 3 (1.6 m)   Wt 190 lb 3.2 oz (86.3 kg)   BMI 33.69 kg/m  No LMP recorded. Patient is postmenopausal.  General: Well-nourished, well-developed in no acute distress.  Lungs: Clear to auscultation bilaterally. Non-labored. Heart: Regular rate and rhythm, no murmurs rubs or gallops.  Abdomen: Bowel sounds are normal; Abdomen is Soft; No hepatosplenomegaly, masses or hernias;  No Abdominal Tenderness; No guarding or rebound tenderness. Neuro: Alert and oriented x 3.  Grossly intact.  Psych: Alert and cooperative, normal mood and affect.   Imaging Studies: No results found.  Labs: CBC    Component Value Date/Time   WBC 3.2 (L) 03/18/2024 0814   RBC 4.40 03/18/2024 0814   HGB 12.9 03/18/2024 0814   HCT 40.6 03/18/2024 0814   PLT 132 (L) 03/18/2024 0814   MCV 92.3 03/18/2024 0814   MCH 29.3 03/18/2024 0814   MCHC 31.8 03/18/2024 0814   RDW 13.2 03/18/2024 0814   LYMPHSABS 1.7 12/04/2022 1252   MONOABS 0.6 12/04/2022 1252   EOSABS 0.1 12/04/2022 1252   BASOSABS 0.0 12/04/2022 1252    CMP     Component Value Date/Time   NA 139 03/18/2024 0814   K 4.0 03/18/2024 0814   CL 105 03/18/2024 0814   CO2 24 03/18/2024 0814   GLUCOSE 77 03/18/2024 0814   BUN 21 03/18/2024 0814   CREATININE  1.10 (H) 03/18/2024 0814   CALCIUM  9.8 03/18/2024 0814   PROT 7.9 06/06/2022 1210   ALBUMIN  4.0 06/06/2022 1210   AST 25 06/06/2022 1210   ALT 18 06/06/2022 1210   ALKPHOS 64 06/06/2022 1210   BILITOT 0.7 06/06/2022 1210   GFRNONAA 54 (L) 03/18/2024 0814   GFRAA 57 (L) 10/01/2019 0037   GFRAA 57 (L) 10/01/2019 0037       Assessment and Plan:   Teresa Collins is a 73 y.o. y/o female returns for follow-up of:  1.  Fecal incontinence: Likely due to decreased anal sphincter tone and weak pelvic floor muscles.  Greatly Improved and resolved on Metamucil. - Continue Metamucil 2 TBSP daily.  2.  History of multiple adenomatous colon polyps -  5-year repeat colonoscopy will be due 02/2027   Ellouise Console, PA-C  Follow up 02/2027 for repeat Colon.  Sooner if recurrent GI symptoms.

## 2024-04-15 ENCOUNTER — Encounter: Payer: Self-pay | Admitting: Physician Assistant

## 2024-04-15 ENCOUNTER — Ambulatory Visit (INDEPENDENT_AMBULATORY_CARE_PROVIDER_SITE_OTHER): Admitting: Physician Assistant

## 2024-04-15 VITALS — BP 128/70 | Ht 63.0 in | Wt 190.2 lb

## 2024-04-15 DIAGNOSIS — Z860101 Personal history of adenomatous and serrated colon polyps: Secondary | ICD-10-CM

## 2024-04-15 DIAGNOSIS — R159 Full incontinence of feces: Secondary | ICD-10-CM

## 2024-04-15 DIAGNOSIS — K5900 Constipation, unspecified: Secondary | ICD-10-CM

## 2024-04-15 NOTE — Patient Instructions (Addendum)
 Continue Metamucil  Please follow up as needed if symptoms increase or worsen  Due to recent changes in healthcare laws, you may see the results of your imaging and laboratory studies on MyChart before your provider has had a chance to review them.  We understand that in some cases there may be results that are confusing or concerning to you. Not all laboratory results come back in the same time frame and the provider may be waiting for multiple results in order to interpret others.  Please give us  48 hours in order for your provider to thoroughly review all the results before contacting the office for clarification of your results.   Thank you for trusting me with your gastrointestinal care!   Ellouise Console, PA-C _______________________________________________________  If your blood pressure at your visit was 140/90 or greater, please contact your primary care physician to follow up on this.  _______________________________________________________  If you are age 7 or older, your body mass index should be between 23-30. Your Body mass index is 33.69 kg/m. If this is out of the aforementioned range listed, please consider follow up with your Primary Care Provider.  If you are age 99 or younger, your body mass index should be between 19-25. Your Body mass index is 33.69 kg/m. If this is out of the aformentioned range listed, please consider follow up with your Primary Care Provider.   ________________________________________________________  The [facility] providers would like to encourage you to use MYCHART to communicate with providers for non-urgent requests or questions.  Due to long hold times on the telephone, sending your provider a message by [facility] may be a faster and more efficient way to get a response.  Please allow 48 business hours for a response.  Please remember that this is for non-urgent requests.  _______________________________________________________

## 2024-04-15 NOTE — Progress Notes (Signed)
 Noted

## 2024-04-28 DIAGNOSIS — M8580 Other specified disorders of bone density and structure, unspecified site: Secondary | ICD-10-CM | POA: Diagnosis not present

## 2024-04-28 DIAGNOSIS — Z85528 Personal history of other malignant neoplasm of kidney: Secondary | ICD-10-CM | POA: Diagnosis not present

## 2024-04-28 DIAGNOSIS — E237 Disorder of pituitary gland, unspecified: Secondary | ICD-10-CM | POA: Diagnosis not present

## 2024-04-28 DIAGNOSIS — E042 Nontoxic multinodular goiter: Secondary | ICD-10-CM | POA: Diagnosis not present

## 2024-04-28 DIAGNOSIS — E559 Vitamin D deficiency, unspecified: Secondary | ICD-10-CM | POA: Diagnosis not present

## 2024-04-28 DIAGNOSIS — Z8639 Personal history of other endocrine, nutritional and metabolic disease: Secondary | ICD-10-CM | POA: Diagnosis not present

## 2024-04-30 DIAGNOSIS — E210 Primary hyperparathyroidism: Secondary | ICD-10-CM | POA: Diagnosis not present

## 2024-06-30 ENCOUNTER — Other Ambulatory Visit: Payer: Self-pay | Admitting: Family Medicine

## 2024-06-30 DIAGNOSIS — Z1231 Encounter for screening mammogram for malignant neoplasm of breast: Secondary | ICD-10-CM

## 2024-07-30 ENCOUNTER — Ambulatory Visit
Admission: RE | Admit: 2024-07-30 | Discharge: 2024-07-30 | Disposition: A | Discharge status: Home / Self Care | Source: Ambulatory Visit | Attending: Family Medicine | Admitting: Family Medicine

## 2024-07-30 DIAGNOSIS — Z1231 Encounter for screening mammogram for malignant neoplasm of breast: Secondary | ICD-10-CM | POA: Diagnosis not present

## 2024-10-22 ENCOUNTER — Ambulatory Visit (INDEPENDENT_AMBULATORY_CARE_PROVIDER_SITE_OTHER)

## 2024-10-22 ENCOUNTER — Ambulatory Visit
Admission: EM | Admit: 2024-10-22 | Discharge: 2024-10-22 | Disposition: A | Discharge status: Home / Self Care | Source: Home / Self Care

## 2024-10-22 ENCOUNTER — Encounter: Payer: Self-pay | Admitting: Emergency Medicine

## 2024-10-22 DIAGNOSIS — R058 Other specified cough: Secondary | ICD-10-CM | POA: Diagnosis not present

## 2024-10-22 DIAGNOSIS — R070 Pain in throat: Secondary | ICD-10-CM | POA: Diagnosis not present

## 2024-10-22 DIAGNOSIS — J209 Acute bronchitis, unspecified: Secondary | ICD-10-CM | POA: Diagnosis not present

## 2024-10-22 LAB — POCT RAPID STREP A (OFFICE): Rapid Strep A Screen: NEGATIVE

## 2024-10-22 MED ORDER — PREDNISONE 50 MG PO TABS: ORAL_TABLET | ORAL | 0 refills | Status: DC

## 2024-10-22 MED ORDER — BENZONATATE 100 MG PO CAPS: 100.0000 mg | ORAL_CAPSULE | Freq: Three times a day (TID) | ORAL | 0 refills | Status: AC

## 2024-10-22 MED ORDER — GUAIFENESIN ER 600 MG PO TB12: 600.0000 mg | ORAL_TABLET | Freq: Two times a day (BID) | ORAL | 0 refills | Status: AC

## 2024-10-22 MED ORDER — IPRATROPIUM-ALBUTEROL 0.5-2.5 (3) MG/3ML IN SOLN
3.0000 mL | Freq: Once | RESPIRATORY_TRACT | Status: AC
  Administered 2024-10-22: 3 mL via RESPIRATORY_TRACT

## 2024-10-22 MED ORDER — ALBUTEROL SULFATE HFA 108 (90 BASE) MCG/ACT IN AERS: 2.0000 | INHALATION_SPRAY | RESPIRATORY_TRACT | 0 refills | Status: AC | PRN

## 2024-10-22 NOTE — ED Triage Notes (Addendum)
 Pt st's she has had cold symptoms for over a week  Now she  has developed a sore throat  St's it feels like needles in my throat  Pt also c/o productive cough (yellow, green, brown)

## 2024-10-22 NOTE — Discharge Instructions (Addendum)
 Waiting for xray overread, initial reading shows no signs of pneumonia. If radiologist says something different we will call.

## 2024-10-22 NOTE — ED Provider Notes (Signed)
 " [facility]    CSN: [id number] Arrival date & time: 10/22/24  1006      History   Chief Complaint Chief Complaint  Patient presents with   Sore Throat   Cough    HPI Teresa Collins is a 74 y.o. female.   Pt presents today due to cough productive of purulent sputum and shortness of breath since about Saturday. Pt states that she was in contact with sick grand baby and believe that is where she got her symptoms from. Pt tried Coricidin and Mucinex for symptoms with no relief.   The history is provided by the patient.  Sore Throat  Cough   Past Medical History:  Diagnosis Date   Arthritis    ASCUS favor benign 12/2015   negative high risk HPV. 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   Cervical myelopathy (HCC) 12/07/2021   Hardening of the aorta (main artery of the heart) 01/13/2021   History of partial nephrectomy 01/13/2021   Hypertensive emergency 01/13/2021   Other seasonal allergic rhinitis 01/13/2021   Personal history of other malignant neoplasm of kidney 01/13/2021   Pure hypercholesterolemia 01/13/2021   Thrombocytopenia 01/13/2021   Hyperglycemia 10/20/2019   Periumbilical abdominal tenderness without rebound tenderness 10/20/2019   SBO (small bowel obstruction) (HCC) 09/30/2019   Chronic renal disease, stage 3, moderately decreased glomerular filtration rate (GFR) between 30-59 mL/min/1.73 square meter (HCC) 02/12/2019   Hypokalemia 95/76/7979   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   IBS (irritable bowel  syndrome) 12/06/2015   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   Visit for screening mammogram 11/29/2015   Atopic eczema 11/29/2015   Renal cell carcinoma (HCC) 05/27/2014   Hyperlipidemia with target LDL less than 130 08/13/2013   Routine general medical examination at a health care facility 03/31/2013   OA (osteoarthritis) 01/17/2012   Vitamin D  deficiency 11/01/2010   GERD 10/12/2008   Essential hypertension 10/11/2008    Past Surgical History:  Procedure Laterality Date   abortions     CARPAL TUNNEL RELEASE Left 2017   CATARACT EXTRACTION, BILATERAL Bilateral    CESAREAN SECTION     1 time   INSERTION OF MESH N/A 07/21/2014   Procedure: INSERTION OF MESH;  Surgeon: Vicenta Poli, MD;  Location: [facility];  Service: General;  Laterality: N/A;   MYOMECTOMY ABDOMINAL APPROACH     NECK SURGERY  11/2021   PARATHYROIDECTOMY Right 03/30/2024   Procedure: PARATHYROIDECTOMY;  Surgeon: Polly Cordella LABOR, MD;  Location: [facility];  Service: General;  Laterality: Right;  RIGHT PARATHYROIDECTOMY, POSSIBLE SUBTOTAL PARATHYROIDECTOMY   POSTERIOR CERVICAL FUSION/FORAMINOTOMY N/A 12/07/2021   Procedure: Cervical Four-Cervical Seven Laminectomies/ Cervical Four -Thoracic One Posterior instrumented spinal fusion;  Surgeon: Cheryle Debby LABOR, MD;  Location: [facility];  Service: Neurosurgery;  Laterality: N/A;  Cervical Four-Cervical Seven Laminectomies/ Cervical Four -Thoracic One Posterior instrumented spinal fusion   ROBOTIC ASSITED PARTIAL NEPHRECTOMY Left 07/21/2014  Procedure: ROBOTIC ASSISTED LEFT PARTIAL NEPHRECTOMY;  Surgeon: Morene LELON Salines, MD;  Location: [facility];  Service: Urology;  Laterality: Left;   TUBAL LIGATION     VENTRAL HERNIA REPAIR N/A 07/21/2014   Procedure: INCISIONAL HERNIA REPAIR WITH MESH ;  Surgeon: Vicenta Poli, MD;  Location: [facility];  Service: General;  Laterality: N/A;    OB History     Gravida  3   Para  1   Term      Preterm      AB  2    Living  1      SAB      IAB      Ectopic      Multiple      Live Births               Home Medications    Prior to Admission medications  Medication Sig Start Date End Date Taking? Authorizing Provider  albuterol  (VENTOLIN  HFA) 108 (90 Base) MCG/ACT inhaler Inhale 2 puffs into the lungs every 4 (four) hours as needed for wheezing or shortness of breath. 10/22/24  Yes Andra Krabbe C, PA-C  benzonatate (TESSALON) 100 MG capsule Take 1 capsule (100 mg total) by mouth every 8 (eight) hours. 10/22/24  Yes Andra Krabbe BROCKS, PA-C  guaiFENesin (MUCINEX) 600 MG 12 hr tablet Take 1 tablet (600 mg total) by mouth 2 (two) times daily for 10 days. 10/22/24 11/01/24 Yes Andra Krabbe BROCKS, PA-C  predniSONE  (DELTASONE ) 50 MG tablet Take 1 tab po daily for 5 days 10/22/24  Yes Andra Krabbe BROCKS, PA-C  aspirin 81 MG chewable tablet Chew 81 mg by mouth in the morning.    [provider]  clobetasol  ointment (TEMOVATE ) 0.05 % 1 application 2 (two) times daily as needed (eczema). 11/19/21   [provider]  Cyanocobalamin  (VITAMIN B 12) 500 MCG TABS Take 500 mcg by mouth in the morning.    [provider]  fluticasone  (FLONASE ) 50 MCG/ACT nasal spray Place 1 spray into both nostrils daily as needed for allergies. 06/28/20   [provider]  levocetirizine (XYZAL ) 5 MG tablet Take 5 mg by mouth every evening.    [provider]  lisinopril  (ZESTRIL ) 40 MG tablet Take 40 mg by mouth every evening. 09/04/21   [provider]  psyllium (METAMUCIL) 58.6 % packet Take 1 packet by mouth daily.    [provider]  rosuvastatin  (CRESTOR ) 20 MG tablet Take 20 mg by mouth in the morning. 01/12/21   [provider]    Family History Family History  Problem Relation Age of Onset   Hypertension Mother    Hypertension Sister    Hypertension Brother    Colon cancer Neg Hx    Esophageal cancer Neg Hx    Rectal cancer Neg Hx     Stomach cancer Neg Hx    Breast cancer Neg Hx    Colon polyps Neg Hx     Social History Social History[1]   Allergies   Amlodipine , Lipitor [atorvastatin], Bystolic  [nebivolol  hcl], Codeine sulfate, Gabapentin, and Latex   Review of Systems Review of Systems  Respiratory:  Positive for cough.      Physical Exam Triage Vital Signs ED Triage Vitals  Encounter Vitals Group     BP 10/22/24 1039 (!) 153/79     Girls Systolic BP Percentile --      Girls Diastolic BP Percentile --      Boys Systolic BP Percentile --  Boys Diastolic BP Percentile --      Pulse Rate 10/22/24 1039 60     Resp 10/22/24 1039 16     Temp 10/22/24 1039 98.1 F (36.7 C)     Temp Source 10/22/24 1039 Oral     SpO2 10/22/24 1039 97 %     Weight 10/22/24 1041 190 lb (86.2 kg)     Height 10/22/24 1041 5' 2 (1.575 m)     Head Circumference --      Peak Flow --      Pain Score 10/22/24 1041 10     Pain Loc --      Pain Education --      Exclude from Growth Chart --    No data found.  Updated Vital Signs BP (!) 153/79 (BP Location: Left Arm)   Pulse 60   Temp 98.1 F (36.7 C) (Oral)   Resp 16   Ht 5' 2 (1.575 m)   Wt 190 lb (86.2 kg)   SpO2 97%   BMI 34.75 kg/m   Visual Acuity Right Eye Distance:   Left Eye Distance:   Bilateral Distance:    Right Eye Near:   Left Eye Near:    Bilateral Near:     Physical Exam Vitals and nursing note reviewed.  Constitutional:      General: She is not in acute distress.    Appearance: Normal appearance. She is not ill-appearing, toxic-appearing or diaphoretic.  Eyes:     General: No scleral icterus. Cardiovascular:     Rate and Rhythm: Normal rate and regular rhythm.     Heart sounds: Normal heart sounds.  Pulmonary:     Effort: Respiratory distress (increased work of breathing) present.     Breath sounds: Decreased air movement present. Examination of the right-upper field reveals decreased breath sounds. Examination of the left-upper  field reveals decreased breath sounds. Decreased breath sounds present. No wheezing or rhonchi.  Skin:    General: Skin is warm.  Neurological:     Mental Status: She is alert and oriented to person, place, and time.  Psychiatric:        Mood and Affect: Mood normal.        Behavior: Behavior normal.      UC Treatments / Results  Labs (all labs ordered are listed, but only abnormal results are displayed) Labs Reviewed  POCT RAPID STREP A (OFFICE) - Normal    EKG   Radiology No results found.  Procedures Procedures (including critical care time)  Medications Ordered in UC Medications  ipratropium-albuterol  (DUONEB) 0.5-2.5 (3) MG/3ML nebulizer solution 3 mL (3 mLs Nebulization Given 10/22/24 1108)    Initial Impression / Assessment and Plan / UC Course  I have reviewed the triage vital signs and the nursing notes.  Pertinent labs & imaging results that were available during my care of the patient were reviewed by me and considered in my medical decision making (see chart for details).    Final Clinical Impressions(s) / UC Diagnoses   Final diagnoses:  Throat pain  Productive cough  Acute bronchitis, unspecified organism   Discharge Instructions   None    ED Prescriptions     Medication Sig Dispense Auth. Provider   benzonatate (TESSALON) 100 MG capsule Take 1 capsule (100 mg total) by mouth every 8 (eight) hours. 30 capsule Andra Krabbe C, PA-C   guaiFENesin (MUCINEX) 600 MG 12 hr tablet Take 1 tablet (600 mg total) by mouth 2 (two) times daily  for 10 days. 20 tablet Abdullahi Vallone C, PA-C   predniSONE  (DELTASONE ) 50 MG tablet Take 1 tab po daily for 5 days 5 tablet Andra Krabbe C, PA-C   albuterol  (VENTOLIN  HFA) 108 (90 Base) MCG/ACT inhaler Inhale 2 puffs into the lungs every 4 (four) hours as needed for wheezing or shortness of breath. 18 g Andra Krabbe BROCKS, PA-C      PDMP not reviewed this encounter.     [1]  Social  History Tobacco Use   Smoking status: Never   Smokeless tobacco: Never  Vaping Use   Vaping status: Never Used  Substance Use Topics   Alcohol use: Not Currently    Alcohol/week: 0.0 standard drinks of alcohol   Drug use: Never     Andra Krabbe BROCKS, PA-C 10/22/24 1141  "

## 2024-10-30 ENCOUNTER — Emergency Department (HOSPITAL_COMMUNITY)

## 2024-10-30 ENCOUNTER — Other Ambulatory Visit: Payer: Self-pay

## 2024-10-30 ENCOUNTER — Emergency Department (HOSPITAL_COMMUNITY)
Admission: EM | Admit: 2024-10-30 | Discharge: 2024-10-30 | Disposition: A | Discharge status: Home / Self Care | Source: Ambulatory Visit | Attending: Emergency Medicine | Admitting: Emergency Medicine

## 2024-10-30 DIAGNOSIS — R0609 Other forms of dyspnea: Secondary | ICD-10-CM

## 2024-10-30 DIAGNOSIS — R7989 Other specified abnormal findings of blood chemistry: Secondary | ICD-10-CM | POA: Insufficient documentation

## 2024-10-30 DIAGNOSIS — J40 Bronchitis, not specified as acute or chronic: Secondary | ICD-10-CM | POA: Diagnosis not present

## 2024-10-30 DIAGNOSIS — J040 Acute laryngitis: Secondary | ICD-10-CM | POA: Insufficient documentation

## 2024-10-30 DIAGNOSIS — Z91040 Latex allergy status: Secondary | ICD-10-CM | POA: Insufficient documentation

## 2024-10-30 DIAGNOSIS — Z7982 Long term (current) use of aspirin: Secondary | ICD-10-CM | POA: Insufficient documentation

## 2024-10-30 DIAGNOSIS — R0602 Shortness of breath: Secondary | ICD-10-CM | POA: Diagnosis present

## 2024-10-30 LAB — BASIC METABOLIC PANEL WITH GFR
Anion gap: 9 (ref 5–15)
BUN: 16 mg/dL (ref 8–23)
CO2: 28 mmol/L (ref 22–32)
Calcium: 9.6 mg/dL (ref 8.9–10.3)
Chloride: 104 mmol/L (ref 98–111)
Creatinine, Ser: 1.29 mg/dL — ABNORMAL HIGH (ref 0.44–1.00)
GFR, Estimated: 44 mL/min — ABNORMAL LOW
Glucose, Bld: 100 mg/dL — ABNORMAL HIGH (ref 70–99)
Potassium: 4.1 mmol/L (ref 3.5–5.1)
Sodium: 140 mmol/L (ref 135–145)

## 2024-10-30 LAB — TROPONIN T, HIGH SENSITIVITY
Troponin T High Sensitivity: <15 ng/L (ref 0–19)
Troponin T High Sensitivity: <15 ng/L (ref 0–19)

## 2024-10-30 LAB — CBC
HCT: 40.6 % (ref 36.0–46.0)
Hemoglobin: 13.5 g/dL (ref 12.0–15.0)
MCH: 29.3 pg (ref 26.0–34.0)
MCHC: 33.3 g/dL (ref 30.0–36.0)
MCV: 88.1 fL (ref 80.0–100.0)
Platelets: 161 K/uL (ref 150–400)
RBC: 4.61 MIL/uL (ref 3.87–5.11)
RDW: 13.8 % (ref 11.5–15.5)
WBC: 6.1 K/uL (ref 4.0–10.5)
nRBC: 0 % (ref 0.0–0.2)

## 2024-10-30 MED ORDER — PREDNISONE 50 MG PO TABS: ORAL_TABLET | ORAL | 0 refills | Status: AC

## 2024-10-30 MED ORDER — IOHEXOL 350 MG/ML SOLN
75.0000 mL | Freq: Once | INTRAVENOUS | Status: AC | PRN
  Administered 2024-10-30: 75 mL via INTRAVENOUS

## 2024-10-30 NOTE — ED Triage Notes (Addendum)
 Patient BIBA coming from PCP for dizziness/SOB, dx with bronchitis and has had tx but not feeling better, has had symptoms for weeks. BP 140/92 HR 62 100% RA. Patient is alert and oriented x 4. Airway patent, respirations even and unlabored. Skin normal, warm and dry. No tx at PCP.

## 2024-10-30 NOTE — ED Provider Notes (Signed)
 " [place] EMERGENCY DEPARTMENT AT [facility] Provider Note   CSN: [id number] Arrival date & time: 10/30/24  1530     Patient presents with: Shortness of Breath and Dizziness   Teresa Collins is a 74 y.o. female.   The patient presents with dizziness and shortness of breath.  She has had shortness of breath for three weeks, worst today, mainly with walking or talking and not at rest. She feels better when lying flat with eyes closed and notes that sitting up bothers her eyes. She has no leg swelling and no known lung or heart disease.  She becomes dizzy when taking deep breaths at home and in clinic. She has had no recent long travel, family history of blood clots, leg swelling, or estrogen use.  She has thick green eye discharge in the mornings and spits up a lot of nasal mucus. She feels like something is stuck in her throat and has occasional throat tightness after talking, without other pain. Her appetite is decreased but she is able to eat and drink.  She was exposed to her 27 month old grandchild with a cold and developed a cough the next day after close contact 12/26. Recent visit at Urgent care diagnosed acute bronchitis. Her prior severe throat pain has improved. She still has a cough, though less severe than before.  The history is provided by the patient.  Shortness of Breath Severity:  Moderate Onset quality:  Gradual Duration:  2 weeks Associated symptoms: cough   Associated symptoms: no chest pain, no diaphoresis and no fever   Dizziness Associated symptoms: shortness of breath   Associated symptoms: no chest pain and no palpitations        Prior to Admission medications  Medication Sig Start Date End Date Taking? Authorizing Provider  albuterol  (VENTOLIN  HFA) 108 (90 Base) MCG/ACT inhaler Inhale 2 puffs into the lungs every 4 (four) hours as needed for wheezing or shortness of breath. 10/22/24   Andra Corean BROCKS, PA-C  aspirin 81 MG chewable  tablet Chew 81 mg by mouth in the morning.    [provider]  benzonatate  (TESSALON ) 100 MG capsule Take 1 capsule (100 mg total) by mouth every 8 (eight) hours. 10/22/24   Peterson, Stephanie C, PA-C  clobetasol  ointment (TEMOVATE ) 0.05 % 1 application 2 (two) times daily as needed (eczema). 11/19/21   [provider]  Cyanocobalamin  (VITAMIN B 12) 500 MCG TABS Take 500 mcg by mouth in the morning.    [provider]  fluticasone  (FLONASE ) 50 MCG/ACT nasal spray Place 1 spray into both nostrils daily as needed for allergies. 06/28/20   [provider]  guaiFENesin  (MUCINEX ) 600 MG 12 hr tablet Take 1 tablet (600 mg total) by mouth 2 (two) times daily for 10 days. 10/22/24 11/01/24  Andra Corean BROCKS, PA-C  levocetirizine (XYZAL ) 5 MG tablet Take 5 mg by mouth every evening.    [provider]  lisinopril  (ZESTRIL ) 40 MG tablet Take 40 mg by mouth every evening. 09/04/21   [provider]  predniSONE  (DELTASONE ) 50 MG tablet Take 1 tab po daily for 5 days 10/30/24   Cleotilde Perkins, DO  psyllium (METAMUCIL) 58.6 % packet Take 1 packet by mouth daily.    [provider]  rosuvastatin  (CRESTOR ) 20 MG tablet Take 20 mg by mouth in the morning. 01/12/21   [provider]    Allergies: Amlodipine , Lipitor [atorvastatin], Bystolic  [nebivolol  hcl], Codeine sulfate, Gabapentin, and Latex  Review of Systems  Constitutional:  Positive for activity change and appetite change. Negative for diaphoresis and fever.  Respiratory:  Positive for cough and shortness of breath.   Cardiovascular:  Negative for chest pain and palpitations.  Neurological:  Positive for dizziness.    Updated Vital Signs BP (!) 169/102   Pulse (!) 52   Temp 98 F (36.7 C) (Oral)   Resp 15   SpO2 100%   Physical Exam Constitutional:      Appearance: She is well-developed and normal weight. She is ill-appearing.  HENT:     Mouth/Throat:     Mouth: Mucous  membranes are moist.     Pharynx: No pharyngeal swelling.  Cardiovascular:     Rate and Rhythm: Normal rate. Rhythm irregular.     Heart sounds: No murmur heard. Pulmonary:     Effort: Pulmonary effort is normal.     Breath sounds: Examination of the right-upper field reveals decreased breath sounds. Decreased breath sounds present. No wheezing, rhonchi or rales.  Abdominal:     General: Bowel sounds are normal.     Palpations: Abdomen is soft. There is no mass.     Tenderness: There is no guarding.  Musculoskeletal:     Right lower leg: No edema.     Left lower leg: No edema.  Skin:    Capillary Refill: Capillary refill takes less than 2 seconds.  Neurological:     Mental Status: She is alert and oriented to person, place, and time.     Cranial Nerves: No cranial nerve deficit.     Motor: No weakness.     (all labs ordered are listed, but only abnormal results are displayed) Labs Reviewed  BASIC METABOLIC PANEL WITH GFR - Abnormal; Notable for the following components:      Result Value   Glucose, Bld 100 (*)    Creatinine, Ser 1.29 (*)    GFR, Estimated 44 (*)    All other components within normal limits  CBC  TROPONIN T, HIGH SENSITIVITY  TROPONIN T, HIGH SENSITIVITY    EKG: EKG Interpretation Date/Time:  Friday October 30 2024 15:43:09 EST Ventricular Rate:  73 PR Interval:  138 QRS Duration:  95 QT Interval:  421 QTC Calculation: 464 R Axis:   46  Text Interpretation: Sinus rhythm Ventricular premature complex Low voltage, precordial leads No significant change since last tracing Confirmed by Dreama Longs ([id number]) on 10/30/2024 6:21:16 PM  Radiology: CT Angio Chest PE W/Cm &/Or Wo Cm Result Date: 10/30/2024 CLINICAL DATA:  Dyspnea, chronic, chest wall or pleura disease suspected EXAM: CT ANGIOGRAPHY CHEST WITH CONTRAST TECHNIQUE: Multidetector CT imaging of the chest was performed using the standard protocol during bolus administration of intravenous contrast.  Multiplanar CT image reconstructions and MIPs were obtained to evaluate the vascular anatomy. RADIATION DOSE REDUCTION: This exam was performed according to the departmental dose-optimization program which includes automated exposure control, adjustment of the mA and/or kV according to patient size and/or use of iterative reconstruction technique. CONTRAST:  75mL OMNIPAQUE  IOHEXOL  350 MG/ML SOLN COMPARISON:  10/30/2024, 09/21/2021 FINDINGS: Cardiovascular: This is a technically adequate evaluation of the pulmonary vasculature. No filling defects or pulmonary emboli. The heart is unremarkable without pericardial effusion. No evidence of thoracic aortic aneurysm or dissection. Atherosclerosis of the aorta and coronary vasculature. Mediastinum/Nodes: No enlarged mediastinal, hilar, or axillary lymph nodes. Thyroid  gland, trachea, and esophagus demonstrate no significant findings. Lungs/Pleura: No acute airspace disease, effusion, or pneumothorax. Central airways are patent. Upper Abdomen:  No acute abnormality. Musculoskeletal: No acute or destructive bony abnormalities. Reconstructed images demonstrate no additional findings. Review of the MIP images confirms the above findings. IMPRESSION: 1. No evidence of pulmonary embolus. 2. No acute intrathoracic process. 3. Aortic Atherosclerosis (ICD10-I70.0). Coronary artery atherosclerosis. Electronically Signed   By: Ozell Daring M.D.   On: 10/30/2024 18:06   DG Chest Portable 1 View Result Date: 10/30/2024 CLINICAL DATA:  Dizziness and shortness of breath EXAM: PORTABLE CHEST 1 VIEW COMPARISON:  10/22/2024 FINDINGS: The heart size and mediastinal contours are within normal limits. Both lungs are clear. The visualized skeletal structures are unremarkable. IMPRESSION: No active disease. Electronically Signed   By: Ozell Daring M.D.   On: 10/30/2024 16:08     Procedures   Medications Ordered in the ED  iohexol  (OMNIPAQUE ) 350 MG/ML injection 75 mL (75 mLs  Intravenous Contrast Given 10/30/24 1743)                                    Medical Decision Making STORMY CONNON is a 74 y.o. female presenting for dyspnea on exertion and increased work of breathing. On presentation vital signs significant for HTN to 161/106, 100% O2 on RA. On exam she slightly diminished on the right upper lobe and becomes dizzy with taking deep breaths. CXR on independent interpretation showed no focal consolidations, normal costophrenic angles. Differential includes ACS, PE, atypical pneumonia, new onset CHF. EKG showed NSR with occasional PVCs. Troponin resulted negative x2. Lab work significant for mild elevation in Cr. Obtained CT PE which was negative for PE and showed no pneumonia. Suspect presentation may be related to post-viral syndrome with recent known exposure 3 weeks ago.   On reassessment patients vital signs are stable and she remains on room air. Plan to discharge home with 5 day course of prednisone  50 mg with follow up with PCP outpatient.     Amount and/or Complexity of Data Reviewed Labs: ordered. Radiology: ordered.  Risk Prescription drug management.       Final diagnoses:  Dyspnea on exertion  Laryngitis  Bronchitis    ED Discharge Orders          Ordered    predniSONE  (DELTASONE ) 50 MG tablet        10/30/24 1826               Cleotilde Perkins, DO 10/30/24 1839  "
